# Patient Record
Sex: Female | Born: 1945 | Race: White | Hispanic: No | Marital: Married | Smoking: Never smoker
Health system: Southern US, Community
[De-identification: ages and names within clinical notes are randomized; demographics above are authoritative.]

## PROBLEM LIST (undated history)

## (undated) DIAGNOSIS — K7460 Unspecified cirrhosis of liver: Secondary | ICD-10-CM

## (undated) DIAGNOSIS — I519 Heart disease, unspecified: Secondary | ICD-10-CM

## (undated) DIAGNOSIS — I4891 Unspecified atrial fibrillation: Secondary | ICD-10-CM

## (undated) DIAGNOSIS — I509 Heart failure, unspecified: Secondary | ICD-10-CM

## (undated) DIAGNOSIS — E039 Hypothyroidism, unspecified: Secondary | ICD-10-CM

## (undated) HISTORY — DX: Hypothyroidism, unspecified: E03.9

## (undated) HISTORY — DX: Heart failure, unspecified: I50.9

## (undated) HISTORY — PX: MITRAL VALVE REPLACEMENT: SHX147

## (undated) HISTORY — DX: Unspecified atrial fibrillation: I48.91

## (undated) HISTORY — DX: Heart disease, unspecified: I51.9

## (undated) HISTORY — DX: Unspecified cirrhosis of liver: K74.60

---

## 1996-03-22 HISTORY — PX: ANKLE SURGERY: SHX546

## 2012-03-22 HISTORY — PX: TOTAL HIP ARTHROPLASTY: SHX124

## 2016-03-22 HISTORY — PX: AORTIC VALVE REPLACEMENT: SHX41

## 2020-04-21 ENCOUNTER — Other Ambulatory Visit: Payer: Self-pay | Admitting: Family Medicine

## 2020-04-21 DIAGNOSIS — Z1231 Encounter for screening mammogram for malignant neoplasm of breast: Secondary | ICD-10-CM

## 2020-04-24 ENCOUNTER — Encounter: Payer: Self-pay | Admitting: Gastroenterology

## 2020-04-29 ENCOUNTER — Encounter: Payer: Self-pay | Admitting: Gastroenterology

## 2020-04-29 ENCOUNTER — Ambulatory Visit: Payer: BC Managed Care – PPO | Admitting: Gastroenterology

## 2020-04-29 ENCOUNTER — Other Ambulatory Visit: Payer: Self-pay

## 2020-04-29 VITALS — BP 116/60 | HR 72 | Ht 60.0 in | Wt 167.0 lb

## 2020-04-29 DIAGNOSIS — R188 Other ascites: Secondary | ICD-10-CM

## 2020-04-29 DIAGNOSIS — K7460 Unspecified cirrhosis of liver: Secondary | ICD-10-CM

## 2020-04-29 NOTE — Progress Notes (Addendum)
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  Patient recently relocated here from [place].  She was evaluated for cirrhosis with ascites in 2019.  She underwent further evaluation by Dr. Lyda Kalata at [facility].  Unfortunately I only have records today that included ultrasound dated 11-22-2019 and a after visit summary with medications.  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  RUQ [place] 11-22-2019 showed cirrhosis, small amount of ascites, no hepatic lesions, cholelithiasis and a 9 mm simple cyst in the right kidney. She has valvular heart disease and has undergone 2 mitral valve replacements and most recently a TAVR in 2018.  She has been managed on a low-sodium diet, torsemide and spironolactone.  She states her weights have been stable in the 160-165 range at home.  No gastrointestinal complaints today. Denies weight loss, abdominal pain, constipation, diarrhea, change in stool caliber, melena, hematochezia, nausea, vomiting, dysphagia, reflux symptoms, chest pain.    No Known Allergies Outpatient Medications Prior to Visit  Medication Sig Dispense Refill  . Cholecalciferol (VITAMIN D3) 50 MCG (2000 UT) TABS Take 6,000 Units by mouth daily.    Marland Kitchen levothyroxine (SYNTHROID) 150 MCG tablet Take 150 mcg by mouth daily before breakfast.    . losartan (COZAAR) 50 MG tablet Take 50 mg by mouth daily.    . metoprolol succinate (TOPROL-XL) 50 MG 24 hr tablet Take 50 mg by mouth daily. Take with or immediately following a meal.    . spironolactone (ALDACTONE) 25 MG tablet Take 25 mg by mouth every other day.    . torsemide (DEMADEX) 20 MG tablet Take 40 mg by mouth daily.    Marland Kitchen warfarin (COUMADIN) 2 MG tablet Take 2 tablets by mouth four times a week    . warfarin (COUMADIN) 6 MG tablet 1 tablet by mouth three days a week     No facility-administered  medications prior to visit.   Past Medical History:  Diagnosis Date  . Atrial fibrillation (HCC)   . Heart disease    aortic and mitral valve problems  . Hepatic cirrhosis (HCC)   . Hypothyroidism    Past Surgical History:  Procedure Laterality Date  . ANKLE SURGERY  1998  . AORTIC VALVE REPLACEMENT  2018  . CESAREAN SECTION    . MITRAL VALVE REPLACEMENT     x 2  . TOTAL HIP ARTHROPLASTY Left 2014   Social History   Socioeconomic History  . Marital status: Married    Spouse name: Not on file  . Number of children: 1  . Years of education: Not on file  . Highest education level: Not on file  Occupational History  . Occupation: retired  Tobacco Use  . Smoking status: Never Smoker  . Smokeless tobacco: Never Used  Vaping Use  . Vaping Use: Never used  Substance and Sexual Activity  . Alcohol use: Yes    Comment: rarely  . Drug use: Not on file  . Sexual activity: Not on file  Other Topics Concern  . Not on file  Social History Narrative  . Not on file   Social Determinants of Health   Financial Resource Strain: Not on file  Food Insecurity: Not on file  Transportation Needs: Not on file  Physical Activity: Not on file  Stress: Not on file  Social Connections: Not on file   Family History  Problem Relation Age of Onset  . Rheumatic fever Mother   . Heart disease Mother 53  . Emphysema Brother   . Heart attack Brother   . Breast cancer Maternal Grandmother   . Heart disease Brother   . Diabetes Brother      Review of Systems: Pertinent positive and negative review of systems were noted in the above HPI section. All other review of systems were otherwise negative.   Physical Exam: General: Well developed, well nourished, no acute distress Head: Normocephalic and atraumatic Eyes: Sclerae anicteric, EOMI Ears: Normal auditory acuity Mouth: Not examined, mask on during Covid-19 pandemic Neck: Supple, no masses or thyromegaly Lungs: Clear throughout to  auscultation Heart: Regular rate and rhythm; no murmurs, rubs or bruits Abdomen: Soft, non tender and non distended. No masses, hepatosplenomegaly or hernias noted. Normal Bowel sounds Rectal: Not done Musculoskeletal: Symmetrical with no gross deformities  Skin: No lesions on visible extremities Pulses:  Normal pulses noted Extremities: No clubbing, cyanosis, edema or deformities noted Neurological: Alert oriented x 4, grossly nonfocal Cervical Nodes:  No significant cervical adenopathy Inguinal Nodes: No significant inguinal adenopathy Psychological:  Alert and cooperative. Normal mood and affect   Assessment and Recommendations:  1. 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  Plan for routine hepatocellular carcinoma screening with an RUQ [place], CMP, CBC and AFP every 6 months, which will be due in March.  She is maintained on Coumadin so will defer PT/INR.  Continue torsemide 40 mg daily and spironolactone 25 mg every other day.  Maintain 2 g sodium diet.  Request hepatology and GI records from [facility].  Determine timing of variceal screening or variceal surveillance after review of records.  Hepatitis A and B vaccination if it has not been done per record review.  No more than 2 g of acetaminophen daily.  REV in September.   2.  CRC screening, average risk.  Await review of records.  3.  Status post MVR, status post TAVR, A. fib.  Maintained on Coumadin.  She will establish with [facility] in March.    cc: Shon Hale, MD [address] [place],  [place] [address]

## 2020-04-29 NOTE — Patient Instructions (Signed)
We will contact you in March to schedule your ultrasound and have repeat labs.   Please follow up with Dr. Russella Dar in 11/2020.   Thank you for choosing me and [facility].  Venita Lick. Pleas Koch., MD., Clementeen Graham

## 2020-05-05 ENCOUNTER — Other Ambulatory Visit: Payer: Self-pay | Admitting: Family Medicine

## 2020-05-05 DIAGNOSIS — M859 Disorder of bone density and structure, unspecified: Secondary | ICD-10-CM

## 2020-05-26 ENCOUNTER — Telehealth: Payer: Self-pay | Admitting: Gastroenterology

## 2020-05-26 DIAGNOSIS — R188 Other ascites: Secondary | ICD-10-CM

## 2020-05-26 DIAGNOSIS — K7460 Unspecified cirrhosis of liver: Secondary | ICD-10-CM

## 2020-05-26 NOTE — Telephone Encounter (Signed)
Pt is requesting a call back from a nurse to schedule her US. 

## 2020-05-26 NOTE — Telephone Encounter (Signed)
New orders placed for labs and [place] per office note from 04/29/20.  Patient notified that she will be contacted by radiology directly to schedule [place]. She can come for labs at her convenience.

## 2020-06-03 ENCOUNTER — Other Ambulatory Visit: Payer: Self-pay

## 2020-06-03 ENCOUNTER — Ambulatory Visit
Admission: RE | Admit: 2020-06-03 | Discharge: 2020-06-03 | Disposition: A | Discharge status: Home / Self Care | Payer: BC Managed Care – PPO | Source: Ambulatory Visit | Attending: Family Medicine | Admitting: Family Medicine

## 2020-06-03 DIAGNOSIS — Z1231 Encounter for screening mammogram for malignant neoplasm of breast: Secondary | ICD-10-CM

## 2020-06-04 ENCOUNTER — Ambulatory Visit (HOSPITAL_COMMUNITY)
Admission: RE | Admit: 2020-06-04 | Discharge: 2020-06-04 | Disposition: A | Discharge status: Home / Self Care | Payer: BC Managed Care – PPO | Source: Ambulatory Visit | Attending: Gastroenterology | Admitting: Gastroenterology

## 2020-06-04 DIAGNOSIS — K7460 Unspecified cirrhosis of liver: Secondary | ICD-10-CM | POA: Diagnosis not present

## 2020-06-04 DIAGNOSIS — R188 Other ascites: Secondary | ICD-10-CM | POA: Insufficient documentation

## 2020-06-05 ENCOUNTER — Telehealth: Payer: Self-pay

## 2020-06-05 DIAGNOSIS — K7460 Unspecified cirrhosis of liver: Secondary | ICD-10-CM

## 2020-06-05 DIAGNOSIS — R188 Other ascites: Secondary | ICD-10-CM

## 2020-06-05 NOTE — Telephone Encounter (Signed)
Please tell the patient that the ultrasound shows cirrhosis and ascites plus gallstones as we knew.  There is some consideration to be given to having an MRI but it is not urgent and Dr. Russella Dar can review this and the prior records to make a decision about the MRI.  There are no tumors or anything seen which is what we were looking for and the radiologist suggested to consider an MRI which is more sensitive.

## 2020-06-05 NOTE — Telephone Encounter (Signed)
Lm on vm for patient to return call 

## 2020-06-05 NOTE — Telephone Encounter (Signed)
Diane at [facility] radiology called to give call report on [place] from 06/04/20. Dr. Leone Payor as DOD this morning, 06/05/20.   Dr. Ardell Isaacs patient with hx of cirrhosis  IMPRESSION: 1. Cirrhosis with heterogeneous hepatic parenchyma. Likely mild hepatic steatosis. No findings of portal hypertension. Limited evaluation for focal hepatic lesion. Recommend MRI liver protocol for further evaluation. 2. Cholelithiasis with no acute cholecystitis. 3. At least small to moderate volume ascites.

## 2020-06-05 NOTE — Telephone Encounter (Signed)
Spoke with patient in regards to her [place] results. She is aware that Dr. Russella Dar will review once he returns and will further advise in regards to MRI. Patient verbalized understanding and had no concerns at the end of the call.

## 2020-06-07 NOTE — Telephone Encounter (Signed)
Ultrasound shows cirrhosis and ascites plus gallstones.  Limited evaluation for focal hepatic lesion.  Schedule abd MRI, as the radiologist suggested, for a more sensitive hepatic evaluation. Please ask her to complete her blood work as ordered.

## 2020-06-09 ENCOUNTER — Other Ambulatory Visit: Payer: Self-pay | Admitting: Gastroenterology

## 2020-06-09 NOTE — Addendum Note (Signed)
Addended by: Annett Fabian on: 06/09/2020 12:19 PM   Modules accepted: Orders

## 2020-06-09 NOTE — Telephone Encounter (Signed)
Patient notified of the results and recommendations She declines MRI for now.  "I have a lot of out of town appointments I need to do, I will call back in a few months" She wishes to go to [facility] for blood draws.  Orders faxed to [phone]

## 2020-06-10 LAB — COMPREHENSIVE METABOLIC PANEL
ALT: 8 IU/L (ref 0–32)
AST: 24 IU/L (ref 0–40)
Albumin/Globulin Ratio: 1.1 — ABNORMAL LOW (ref 1.2–2.2)
Albumin: 4.1 g/dL (ref 3.7–4.7)
Alkaline Phosphatase: 90 IU/L (ref 44–121)
BUN/Creatinine Ratio: 27 (ref 12–28)
BUN: 39 mg/dL — ABNORMAL HIGH (ref 8–27)
Bilirubin Total: 0.8 mg/dL (ref 0.0–1.2)
CO2: 26 mmol/L (ref 20–29)
Calcium: 9.3 mg/dL (ref 8.7–10.3)
Chloride: 95 mmol/L — ABNORMAL LOW (ref 96–106)
Creatinine, Ser: 1.47 mg/dL — ABNORMAL HIGH (ref 0.57–1.00)
Globulin, Total: 3.6 g/dL (ref 1.5–4.5)
Glucose: 87 mg/dL (ref 65–99)
Potassium: 4.8 mmol/L (ref 3.5–5.2)
Sodium: 135 mmol/L (ref 134–144)
Total Protein: 7.7 g/dL (ref 6.0–8.5)
eGFR: 37 mL/min/{1.73_m2} — ABNORMAL LOW (ref >59)

## 2020-06-10 LAB — CBC WITH DIFFERENTIAL/PLATELET
Basophils Absolute: 0.1 10*3/uL (ref 0.0–0.2)
Basos: 1 %
EOS (ABSOLUTE): 0.4 10*3/uL (ref 0.0–0.4)
Eos: 6 %
Hematocrit: 36.9 % (ref 34.0–46.6)
Hemoglobin: 11.8 g/dL (ref 11.1–15.9)
Immature Grans (Abs): 0 10*3/uL (ref 0.0–0.1)
Immature Granulocytes: 0 %
Lymphocytes Absolute: 1 10*3/uL (ref 0.7–3.1)
Lymphs: 18 %
MCH: 29.1 pg (ref 26.6–33.0)
MCHC: 32 g/dL (ref 31.5–35.7)
MCV: 91 fL (ref 79–97)
Monocytes Absolute: 0.6 10*3/uL (ref 0.1–0.9)
Monocytes: 11 %
Neutrophils Absolute: 3.6 10*3/uL (ref 1.4–7.0)
Neutrophils: 64 %
Platelets: 182 10*3/uL (ref 150–450)
RBC: 4.05 x10E6/uL (ref 3.77–5.28)
RDW: 13.4 % (ref 11.7–15.4)
WBC: 5.7 10*3/uL (ref 3.4–10.8)

## 2020-06-10 LAB — AFP TUMOR MARKER: AFP, Serum, Tumor Marker: 2.2 ng/mL (ref 0.0–9.2)

## 2020-06-18 ENCOUNTER — Other Ambulatory Visit: Payer: Self-pay

## 2020-06-18 ENCOUNTER — Encounter: Payer: Self-pay | Admitting: Cardiovascular Disease

## 2020-06-18 ENCOUNTER — Ambulatory Visit: Payer: BC Managed Care – PPO | Admitting: Cardiovascular Disease

## 2020-06-18 VITALS — BP 102/50 | HR 59 | Ht 60.0 in | Wt 176.8 lb

## 2020-06-18 DIAGNOSIS — Z952 Presence of prosthetic heart valve: Secondary | ICD-10-CM

## 2020-06-18 DIAGNOSIS — K7460 Unspecified cirrhosis of liver: Secondary | ICD-10-CM

## 2020-06-18 DIAGNOSIS — I252 Old myocardial infarction: Secondary | ICD-10-CM

## 2020-06-18 DIAGNOSIS — I071 Rheumatic tricuspid insufficiency: Secondary | ICD-10-CM | POA: Diagnosis not present

## 2020-06-18 DIAGNOSIS — K7469 Other cirrhosis of liver: Secondary | ICD-10-CM

## 2020-06-18 DIAGNOSIS — Z7901 Long term (current) use of anticoagulants: Secondary | ICD-10-CM

## 2020-06-18 DIAGNOSIS — I50812 Chronic right heart failure: Secondary | ICD-10-CM | POA: Diagnosis not present

## 2020-06-18 DIAGNOSIS — I10 Essential (primary) hypertension: Secondary | ICD-10-CM

## 2020-06-18 DIAGNOSIS — I4821 Permanent atrial fibrillation: Secondary | ICD-10-CM

## 2020-06-18 MED ORDER — SPIRONOLACTONE 25 MG PO TABS: 25.0000 mg | ORAL_TABLET | ORAL | 4 refills | Status: DC

## 2020-06-18 MED ORDER — TORSEMIDE 20 MG PO TABS: 40.0000 mg | ORAL_TABLET | Freq: Every day | ORAL | 4 refills | Status: DC

## 2020-06-18 MED ORDER — AMOXICILLIN 500 MG PO CAPS: ORAL_CAPSULE | ORAL | 1 refills | Status: DC

## 2020-06-18 MED ORDER — METOPROLOL SUCCINATE ER 50 MG PO TB24: 50.0000 mg | ORAL_TABLET | Freq: Every day | ORAL | 4 refills | Status: DC

## 2020-06-18 MED ORDER — LOSARTAN POTASSIUM 50 MG PO TABS: 50.0000 mg | ORAL_TABLET | Freq: Every day | ORAL | 4 refills | Status: DC

## 2020-06-18 NOTE — Progress Notes (Signed)
This encounter was created in error - please disregard.

## 2020-06-18 NOTE — Progress Notes (Signed)
Cardiology Consultation Note:    Date:  06/18/2020   ID:  Chelsea Boyer, DOB March 14, 1946, MRN [id number]  PCP:  [facility]  Cardiologist:  Thurmon Fair, MD  Advanced Practice Provider:  No care team member to display Electrophysiologist:  None       Referring MD: Shon Hale, *   Chief Complaint  Patient presents with  . New Patient (Initial Visit)   Chelsea Boyer is a 75 y.o. female who is being seen today for the evaluation of valvular heart disease at the request of Shon Hale, *.   History of Present Illness:    Chelsea Boyer is a 75 y.o. female with a hx of suspected rheumatic heart disease and multiple subsequent valvular surgeries.  She has relocated from [place] to be closer to her grandchildren.  She was initially diagnosed with mitral valve disease and underwent mitral valve replacement with a biological prosthesis in 1986 (at that point she was still establishing her own family).  She subsequent underwent replacement of the biological prosthesis with a mechanical valve (Saint Jude 25 mm) in 1996.  During fall, she developed aortic valve stenosis and underwent TAVR with a Edwards SAPIEN valve (size unknown) in 2018.  She has a longstanding history of atrial fibrillation and after failing 3 previous attempts at cardioversion is being managed with rate control for multiple years.  Review of echocardiogram shows that she initially had mild-moderate tricuspid insufficiency as recently as 2013 that progressively worsened.  Since 2015 her tricuspid valve regurgitation has been described as being at least moderate to severe.    Before her TAVR procedure she was diagnosed with a non-STEMI, but as far as I understand, coronary angiography did not show any evidence of significant coronary stenoses (report not available).  Following her TAVR procedure in 2018 she developed ascites and severe lower extremity edema and was  diagnosed with cirrhosis, but did not have esophageal varices.  This has been managed successfully with diuretics.  He had a more extensive evaluation for pulmonary hypertension including a normal VQ scan and overnight symmetry and high resolution CT scan of the chest studies to exclude pulmonary embolism and obstructive sleep apnea and interstitial lung disease, respectively.  She feels well.  She has NYHA functional class II exertional dyspnea, but denies any problems with chest pain, dizziness, palpitations, syncope, falls or bleeding problems.  Since moving to [place] she has been having her prothrombin time checked at lab core with results being managed by her cardiologist in [place].  Additional medical problems include treated HTN and hypothyroidism on levothyroxine supplement.  Past Medical History:  Diagnosis Date  . Atrial fibrillation (HCC)   . Heart disease    aortic and mitral valve problems  . Hepatic cirrhosis (HCC)   . Hypothyroidism     Past Surgical History:  Procedure Laterality Date  . ANKLE SURGERY  1998  . AORTIC VALVE REPLACEMENT  2018  . CESAREAN SECTION    . MITRAL VALVE REPLACEMENT     x 2  . TOTAL HIP ARTHROPLASTY Left 2014    Current Medications: Current Meds  Medication Sig  . amoxicillin (AMOXIL) 500 MG capsule Take 4 tablets (2000 mg) prior to the dental visit  . Cholecalciferol (VITAMIN D3) 50 MCG (2000 UT) TABS Take 6,000 Units by mouth daily.  Marland Kitchen levothyroxine (SYNTHROID) 150 MCG tablet Take 150 mcg by mouth daily before breakfast.  . warfarin (COUMADIN) 2 MG  tablet Take 2 tablets by mouth four times a week  . warfarin (COUMADIN) 6 MG tablet 1 tablet by mouth three days a week  . [DISCONTINUED] losartan (COZAAR) 50 MG tablet Take 50 mg by mouth daily.  . [DISCONTINUED] metoprolol succinate (TOPROL-XL) 50 MG 24 hr tablet Take 50 mg by mouth daily. Take with or immediately following a meal.  . [DISCONTINUED] spironolactone (ALDACTONE) 25 MG  tablet Take 25 mg by mouth every other day.  . [DISCONTINUED] torsemide (DEMADEX) 20 MG tablet Take 40 mg by mouth daily.     Allergies:   Patient has no known allergies.   Social History   Socioeconomic History  . Marital status: Married    Spouse name: Not on file  . Number of children: 1  . Years of education: Not on file  . Highest education level: Not on file  Occupational History  . Occupation: retired  Tobacco Use  . Smoking status: Never Smoker  . Smokeless tobacco: Never Used  Vaping Use  . Vaping Use: Never used  Substance and Sexual Activity  . Alcohol use: Yes    Comment: rarely  . Drug use: Not on file  . Sexual activity: Not on file  Other Topics Concern  . Not on file  Social History Narrative  . Not on file   Social Determinants of Health   Financial Resource Strain: Not on file  Food Insecurity: Not on file  Transportation Needs: Not on file  Physical Activity: Not on file  Stress: Not on file  Social Connections: Not on file     Family History: The patient's family history includes Breast cancer in her maternal grandmother; Diabetes in her brother; Emphysema in her brother; Heart attack in her brother; Heart disease in her brother; Heart disease (age of onset: 2936) in her mother; Rheumatic fever in her mother.  ROS:   Please see the history of present illness.     All other systems reviewed and are negative.  EKGs/Labs/Other Studies Reviewed:    The following studies were reviewed today: Office records, labs, nuclear stress test and [facility] [place]    The most recent echocardiogram is from 03/06/2020.  This shows normal left ventricular size and function normally functioning aortic valve prosthesis (peak velocity 3 m/s, maximum gradient 35 mmHg, mean gradient 22 mmHg, trace aortic insufficiency, not specified whether perivalvular or intravalvular), normally functioning mechanical mitral valve prosthesis with a  mean gradient of 7 mmHg (heart rate not specified, pressure half-time not reported), moderate to severe tricuspid insufficiency, calculated PA systolic pressure 44 mmHg assuming RA pressure 3 mmHg.  The most recent nuclear perfusion study is dated June 14, 2012 and shows a an apical attenuation defect, no ischemia seen, EF 55%  EKG:  EKG is ordered today.  The ekg ordered today demonstrates fibrillation with slow ventricular response 59 bpm, left bundle branch block with left axis deviation, QRS 152 ms, QTC 463 ms  Recent Labs: 06/09/2020: ALT 8; BUN 39; Creatinine, Ser 1.47; Hemoglobin 11.8; Platelets 182; Potassium 4.8; Sodium 135   Previous labs from [place] appear to show a baseline BUN of 50 and creatinine of 1.33, normal liver function tests including albumin of 4.3, cholesterol 162, triglycerides 94, HDL 49, calculated LDL 91, hemoglobin 12.5 Recent Lipid Panel No results found for: CHOL, TRIG, HDL, CHOLHDL, VLDL, LDLCALC, LDLDIRECT   Risk Assessment/Calculations:    CHA2DS2-VASc Score =   Not appropriate (mechanical heart valve) This indicates a  % annual  risk of stroke. The patient's score is based upon:      Physical Exam:    VS:  BP (!) 102/50   Pulse (!) 59   Ht 5' (1.524 m)   Wt 176 lb 12.8 oz (80.2 kg)   BMI 34.53 kg/m     Wt Readings from Last 3 Encounters:  06/18/20 176 lb 12.8 oz (80.2 kg)  04/29/20 167 lb (75.8 kg)     GEN: Moderately obese, well nourished, well developed in no acute distress HEENT: Normal NECK: Jugular venous pulsations 4-5 cm above the sternal angle, with very prominent V waves all the way to the angle of the jaw; No carotid bruits LYMPHATICS: No lymphadenopathy CARDIAC: Irregular, crisp mechanical valve clicks, 2/6 aortic ejection murmur, no diastolic murmurs, rubs, gallops RESPIRATORY:  Clear to auscultation without rales, wheezing or rhonchi  ABDOMEN: Soft, non-tender, non-distended MUSCULOSKELETAL:  No edema; No deformity  SKIN:  Warm and dry NEUROLOGIC:  Alert and oriented x 3 PSYCHIATRIC:  Normal affect   ASSESSMENT:    1. H/O mitral valve replacement   2. Chronic right-sided heart failure (HCC)   3. Rheumatic tricuspid valve regurgitation   4. H/O mitral valve replacement with mechanical valve   5. History of transcatheter aortic valve replacement (TAVR)   6. History of myocardial infarction   7. Permanent atrial fibrillation (HCC)   8. Essential hypertension   9. Other cirrhosis of liver (HCC)   10. Long term (current) use of anticoagulants    PLAN:    In order of problems listed above:  1. Right heart failure/PAH: She has evidence of elevated right heart filling pressures, but no edema.  She has probably severe tricuspid regurgitation and this is the most likely cause for cardiac cirrhosis.  Currently appears to be at optimal fluid status however, considering her blood pressure.  Continue current dose of diuretic.  Echocardiograms have estimated variable degrees of pulmonary hypertension, in the 40-60 mmHg range.  In part this variability appears to be related to the assuming the right atrial pressure.  It appears likely that her true systolic PA pressure is in the 55-60 mmHg range since I doubt that she has had normal right atrial pressure in a long time. 2. Severe tricuspid regurgitation: Physical exam clearly supports a diagnosis of severe TR.  I cannot tell from the current available data whether this is due to rheumatic tricuspid valve disease or a consequence of pulmonary hypertension or both.  We will get an updated echocardiogram.  She is not a candidate for redo thoracotomy for mitral valve surgical repair, which would have been her third open heart surgery.  She might be a candidate for a tricuspid valve clip, unless the leaflets are quite restricted. 3. Mechanical MVR: Moderately elevated mean gradient at her last echocardiogram, but stable over the years.  Not sure if it is significant since the heart  rate was not reported.  She is aware of the need for endocarditis prophylaxis. 4. TAVR: Normal gradients.  We will follow with echo.  Need to clarify whether the aortic insufficiency is valvular or perivalvular.  This is not evident from the available reports. 5. Hx of NSTEMI: I suspect that this was not due to conventional coronary disease.  Could have been cardioembolic or demand ischemia in the setting of severe aortic stenosis.  There is no mention of coronary disease in her records. 6. Permanent atrial fibrillation: She has severe left atrial dilation and severe tricuspid insufficiency, the chances of return  to sinus rhythm are nail.  She is on appropriate anticoagulation.  She is well rate controlled. 7. HTN: Blood pressure is in low normal range.  Metoprolol is serving for both rate control and high blood pressure.  Spironolactone is beneficial in the setting of liver disease.  If we need to back off blood pressure medicines, I would reduce or stop the losartan. 8. 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  Thankfully she has not had any episodes of encephalopathy or gastrointestinal bleeding and her most recent lab test did not show any evidence of parenchymal insufficiency or ongoing injury (normal albumin and normal cholesterol levels, normal transaminases). 9. Anticoagulation: We will enroll in our Coumadin clinic.        Medication Adjustments/Labs and Tests Ordered: Current medicines are reviewed at length with the patient today.  Concerns regarding medicines are outlined above.  Orders Placed This Encounter  Procedures  . EKG 12-Lead  . ECHOCARDIOGRAM COMPLETE   Meds ordered this encounter  Medications  . amoxicillin (AMOXIL) 500 MG capsule    Sig: Take 4 tablets (2000 mg) prior to the dental visit    Dispense:  4 capsule    Refill:  1  . losartan (COZAAR) 50 MG tablet    Sig: Take 1 tablet (50 mg total) by mouth daily.    Dispense:  30 tablet    Refill:  4   . metoprolol succinate (TOPROL-XL) 50 MG 24 hr tablet    Sig: Take 1 tablet (50 mg total) by mouth daily. Take with or immediately following a meal.    Dispense:  30 tablet    Refill:  4  . spironolactone (ALDACTONE) 25 MG tablet    Sig: Take 1 tablet (25 mg total) by mouth every other day.    Dispense:  30 tablet    Refill:  4  . torsemide (DEMADEX) 20 MG tablet    Sig: Take 2 tablets (40 mg total) by mouth daily.    Dispense:  60 tablet    Refill:  4    Patient Instructions  Medication Instructions:  TAKE the Amoxicillin 2000 mg (4 tablets) one hour prior to a dental procedure  *If you need a refill on your cardiac medications before your next appointment, please call your pharmacy*   Lab Work: None ordered If you have labs (blood work) drawn today and your tests are completely normal, you will receive your results only by: Marland Kitchen MyChart Message (if you have MyChart) OR . A paper copy in the mail If you have any lab test that is abnormal or we need to change your treatment, we will call you to review the results.   Testing/Procedures: Your physician has requested that you have an echocardiogram in July. Echocardiography is a painless test that uses sound waves to create images of your heart. It provides your doctor with information about the size and shape of your heart and how well your heart's chambers and valves are working. You may receive an ultrasound enhancing agent through an IV if needed to better visualize your heart during the echo.This procedure takes approximately one hour. There are no restrictions for this procedure. This will take place at the [address].     Follow-Up: At [facility], you and your health needs are our priority.  As part of our continuing mission to provide you with exceptional heart care, we have created designated Provider Care Teams.  These Care Teams include your  primary Cardiologist (physician) and Advanced Practice  Providers (APPs -  Physician Assistants and Nurse Practitioners) who all work together to provide you with the care you need, when you need it.  We recommend signing up for the patient portal called "MyChart".  Sign up information is provided on this After Visit Summary.  MyChart is used to connect with patients for Virtual Visits (Telemedicine).  Patients are able to view lab/test results, encounter notes, upcoming appointments, etc.  Non-urgent messages can be sent to your provider as well.   To learn more about what you can do with MyChart, go to [email or web address].    Your next appointment:   Follow up in July with Dr. Royann Shivers after the echo       Signed, Thurmon Fair, MD  06/18/2020 2:12 PM    [facility]

## 2020-06-18 NOTE — Patient Instructions (Addendum)
Medication Instructions:  TAKE the Amoxicillin 2000 mg (4 tablets) one hour prior to a dental procedure  *If you need a refill on your cardiac medications before your next appointment, please call your pharmacy*   Lab Work: None ordered If you have labs (blood work) drawn today and your tests are completely normal, you will receive your results only by: Marland Kitchen MyChart Message (if you have MyChart) OR . A paper copy in the mail If you have any lab test that is abnormal or we need to change your treatment, we will call you to review the results.   Testing/Procedures: Your physician has requested that you have an echocardiogram in July. Echocardiography is a painless test that uses sound waves to create images of your heart. It provides your doctor with information about the size and shape of your heart and how well your heart's chambers and valves are working. You may receive an ultrasound enhancing agent through an IV if needed to better visualize your heart during the echo.This procedure takes approximately one hour. There are no restrictions for this procedure. This will take place at the [address], [address].     Follow-Up: At [facility], you and your health needs are our priority.  As part of our continuing mission to provide you with exceptional heart care, we have created designated Provider Care Teams.  These Care Teams include your primary Cardiologist (physician) and Advanced Practice Providers (APPs -  Physician Assistants and Nurse Practitioners) who all work together to provide you with the care you need, when you need it.  We recommend signing up for the patient portal called "MyChart".  Sign up information is provided on this After Visit Summary.  MyChart is used to connect with patients for Virtual Visits (Telemedicine).  Patients are able to view lab/test results, encounter notes, upcoming appointments, etc.  Non-urgent messages can be sent to your provider as well.   To  learn more about what you can do with MyChart, go to [email or web address].    Your next appointment:   Follow up in July with Dr. Royann Shivers after the echo

## 2020-06-23 ENCOUNTER — Other Ambulatory Visit: Payer: Self-pay

## 2020-06-23 DIAGNOSIS — K7460 Unspecified cirrhosis of liver: Secondary | ICD-10-CM

## 2020-06-23 MED ORDER — TORSEMIDE 20 MG PO TABS: 20.0000 mg | ORAL_TABLET | Freq: Every day | ORAL | 4 refills | Status: DC

## 2020-06-25 ENCOUNTER — Ambulatory Visit (INDEPENDENT_AMBULATORY_CARE_PROVIDER_SITE_OTHER): Payer: BC Managed Care – PPO

## 2020-06-25 ENCOUNTER — Other Ambulatory Visit: Payer: Self-pay

## 2020-06-25 DIAGNOSIS — Z7901 Long term (current) use of anticoagulants: Secondary | ICD-10-CM | POA: Insufficient documentation

## 2020-06-25 DIAGNOSIS — I4891 Unspecified atrial fibrillation: Secondary | ICD-10-CM | POA: Insufficient documentation

## 2020-06-25 DIAGNOSIS — I824Y9 Acute embolism and thrombosis of unspecified deep veins of unspecified proximal lower extremity: Secondary | ICD-10-CM | POA: Insufficient documentation

## 2020-06-25 LAB — POCT INR: INR: 4.1 — AB (ref 2.0–3.0)

## 2020-06-25 NOTE — Patient Instructions (Addendum)
Hold today only and then continue taking 2 (2mg ) tablets on Sunday, Monday, Wednesday and Saturday.  Take 1 (6 mg) tablet other days of the week. INR in 4 wks. [phone]

## 2020-07-01 ENCOUNTER — Telehealth: Payer: Self-pay | Admitting: Gastroenterology

## 2020-07-01 DIAGNOSIS — K7460 Unspecified cirrhosis of liver: Secondary | ICD-10-CM

## 2020-07-01 NOTE — Telephone Encounter (Signed)
Patient would like to go to [facility] for labs that are due this week.  Orders faxed to [facility] [address] at [phone]

## 2020-07-01 NOTE — Telephone Encounter (Signed)
Patient calling for lab results also has questions regarding pending lab to be done at [facility]

## 2020-07-02 ENCOUNTER — Telehealth: Payer: Self-pay | Admitting: Gastroenterology

## 2020-07-02 NOTE — Telephone Encounter (Signed)
Inbound call from patient. States she was unable to get labs done today because their fax is now out of ink and nothing is going through. She wanted to let you know.

## 2020-07-02 NOTE — Telephone Encounter (Signed)
Noted the pt will have completed as soon as possible.

## 2020-07-03 NOTE — Telephone Encounter (Signed)
Patient calling to follow up on fax please let her know.

## 2020-07-03 NOTE — Telephone Encounter (Signed)
Please send to [facility] thanks

## 2020-07-10 NOTE — Telephone Encounter (Signed)
Patient reports that she has not gone for the labs.  She will go tomorrow.

## 2020-07-23 ENCOUNTER — Other Ambulatory Visit: Payer: Self-pay

## 2020-07-23 ENCOUNTER — Ambulatory Visit (INDEPENDENT_AMBULATORY_CARE_PROVIDER_SITE_OTHER): Payer: BC Managed Care – PPO

## 2020-07-23 DIAGNOSIS — Z7901 Long term (current) use of anticoagulants: Secondary | ICD-10-CM | POA: Diagnosis not present

## 2020-07-23 DIAGNOSIS — Z952 Presence of prosthetic heart valve: Secondary | ICD-10-CM | POA: Diagnosis not present

## 2020-07-23 LAB — POCT INR: INR: 3.2 — AB (ref 2.0–3.0)

## 2020-07-23 NOTE — Patient Instructions (Signed)
continue taking 2 (2mg ) tablets on Sunday, Monday, Wednesday and Saturday.  Take 1 (6 mg) tablet other days of the week. INR in 6 wks.

## 2020-08-06 ENCOUNTER — Telehealth: Payer: Self-pay

## 2020-08-06 MED ORDER — WARFARIN SODIUM 6 MG PO TABS: ORAL_TABLET | ORAL | 0 refills | Status: DC

## 2020-08-06 MED ORDER — WARFARIN SODIUM 2 MG PO TABS: ORAL_TABLET | ORAL | 1 refills | Status: DC

## 2020-08-06 NOTE — Telephone Encounter (Signed)
Filled the 2 and 6mg  warfarin as requested

## 2020-08-12 ENCOUNTER — Ambulatory Visit
Admission: RE | Admit: 2020-08-12 | Discharge: 2020-08-12 | Disposition: A | Discharge status: Home / Self Care | Payer: BC Managed Care – PPO | Source: Ambulatory Visit | Attending: Family Medicine | Admitting: Family Medicine

## 2020-08-12 ENCOUNTER — Other Ambulatory Visit: Payer: Self-pay | Admitting: Family Medicine

## 2020-08-12 DIAGNOSIS — R059 Cough, unspecified: Secondary | ICD-10-CM

## 2020-08-21 ENCOUNTER — Other Ambulatory Visit: Payer: Self-pay | Admitting: Cardiovascular Disease

## 2020-08-29 ENCOUNTER — Other Ambulatory Visit: Payer: Self-pay | Admitting: Cardiovascular Disease

## 2020-09-03 ENCOUNTER — Other Ambulatory Visit: Payer: Self-pay

## 2020-09-03 ENCOUNTER — Ambulatory Visit (INDEPENDENT_AMBULATORY_CARE_PROVIDER_SITE_OTHER): Payer: BC Managed Care – PPO

## 2020-09-03 DIAGNOSIS — Z7901 Long term (current) use of anticoagulants: Secondary | ICD-10-CM | POA: Diagnosis not present

## 2020-09-03 DIAGNOSIS — Z952 Presence of prosthetic heart valve: Secondary | ICD-10-CM

## 2020-09-03 LAB — POCT INR: INR: 6.1 — AB (ref 2.0–3.0)

## 2020-09-03 NOTE — Patient Instructions (Signed)
Hold tonight, Thursday and Friday and then continue taking 2 (2mg ) tablets on Sunday, Monday, Wednesday and Saturday.  Take 1 (6 mg) tablet other days of the week. INR in 3 wks.

## 2020-09-06 ENCOUNTER — Other Ambulatory Visit: Payer: Self-pay | Admitting: Cardiovascular Disease

## 2020-09-24 ENCOUNTER — Other Ambulatory Visit: Payer: Self-pay

## 2020-09-24 ENCOUNTER — Ambulatory Visit (INDEPENDENT_AMBULATORY_CARE_PROVIDER_SITE_OTHER): Payer: BC Managed Care – PPO

## 2020-09-24 ENCOUNTER — Telehealth: Payer: Self-pay

## 2020-09-24 DIAGNOSIS — Z952 Presence of prosthetic heart valve: Secondary | ICD-10-CM | POA: Diagnosis not present

## 2020-09-24 DIAGNOSIS — Z7901 Long term (current) use of anticoagulants: Secondary | ICD-10-CM

## 2020-09-24 LAB — POCT INR: INR: 3.7 — AB (ref 2.0–3.0)

## 2020-09-24 NOTE — Patient Instructions (Signed)
Decrease to 2 (2mg ) tablets on Sunday, Monday, and Saturday. Take 1 (2 mg) tablet on Wednesday.  Take 1 (6 mg) tablet Tuesday, Thursday and Friday. INR in 6 wks.

## 2020-09-25 ENCOUNTER — Other Ambulatory Visit: Payer: Self-pay

## 2020-09-25 MED ORDER — WARFARIN SODIUM 2 MG PO TABS: ORAL_TABLET | ORAL | 0 refills | Status: DC

## 2020-09-25 MED ORDER — WARFARIN SODIUM 6 MG PO TABS: ORAL_TABLET | ORAL | 0 refills | Status: DC

## 2020-10-02 ENCOUNTER — Other Ambulatory Visit: Payer: Self-pay

## 2020-10-02 ENCOUNTER — Ambulatory Visit (HOSPITAL_COMMUNITY): Payer: BC Managed Care – PPO | Attending: Cardiology

## 2020-10-02 DIAGNOSIS — Z952 Presence of prosthetic heart valve: Secondary | ICD-10-CM | POA: Diagnosis not present

## 2020-10-02 LAB — ECHOCARDIOGRAM COMPLETE
AR max vel: 0.8 cm2
AV Area VTI: 0.87 cm2
AV Area mean vel: 1.05 cm2
AV Mean grad: 20 mmHg
AV Peak grad: 34.3 mmHg
Ao pk vel: 2.93 m/s
Area-P 1/2: 2.9 cm2
MV VTI: 1.82 cm2
S' Lateral: 2.6 cm

## 2020-10-03 NOTE — Telephone Encounter (Signed)
done

## 2020-10-04 ENCOUNTER — Other Ambulatory Visit: Payer: Self-pay | Admitting: Cardiovascular Disease

## 2020-10-06 ENCOUNTER — Ambulatory Visit
Admission: RE | Admit: 2020-10-06 | Discharge: 2020-10-06 | Disposition: A | Discharge status: Home / Self Care | Payer: Medicare Other | Source: Ambulatory Visit | Attending: Family Medicine | Admitting: Family Medicine

## 2020-10-06 ENCOUNTER — Other Ambulatory Visit: Payer: Self-pay

## 2020-10-06 DIAGNOSIS — M859 Disorder of bone density and structure, unspecified: Secondary | ICD-10-CM

## 2020-10-08 ENCOUNTER — Ambulatory Visit: Payer: BC Managed Care – PPO | Admitting: Cardiovascular Disease

## 2020-10-17 ENCOUNTER — Other Ambulatory Visit: Payer: Self-pay | Admitting: Cardiovascular Disease

## 2020-10-22 ENCOUNTER — Encounter: Payer: Self-pay | Admitting: Cardiovascular Disease

## 2020-10-22 ENCOUNTER — Ambulatory Visit (INDEPENDENT_AMBULATORY_CARE_PROVIDER_SITE_OTHER): Payer: BC Managed Care – PPO | Admitting: Cardiovascular Disease

## 2020-10-22 ENCOUNTER — Other Ambulatory Visit: Payer: Self-pay

## 2020-10-22 VITALS — BP 120/54 | HR 64 | Ht 60.0 in | Wt 168.0 lb

## 2020-10-22 DIAGNOSIS — I50812 Chronic right heart failure: Secondary | ICD-10-CM | POA: Diagnosis not present

## 2020-10-22 DIAGNOSIS — I2721 Secondary pulmonary arterial hypertension: Secondary | ICD-10-CM | POA: Diagnosis not present

## 2020-10-22 DIAGNOSIS — I10 Essential (primary) hypertension: Secondary | ICD-10-CM

## 2020-10-22 DIAGNOSIS — I252 Old myocardial infarction: Secondary | ICD-10-CM | POA: Diagnosis not present

## 2020-10-22 DIAGNOSIS — Z952 Presence of prosthetic heart valve: Secondary | ICD-10-CM

## 2020-10-22 DIAGNOSIS — I071 Rheumatic tricuspid insufficiency: Secondary | ICD-10-CM

## 2020-10-22 DIAGNOSIS — I4821 Permanent atrial fibrillation: Secondary | ICD-10-CM

## 2020-10-22 DIAGNOSIS — K7469 Other cirrhosis of liver: Secondary | ICD-10-CM

## 2020-10-22 DIAGNOSIS — Z7901 Long term (current) use of anticoagulants: Secondary | ICD-10-CM

## 2020-10-22 MED ORDER — WARFARIN SODIUM 2 MG PO TABS: 2.0000 mg | ORAL_TABLET | Freq: Two times a day (BID) | ORAL | 11 refills | Status: DC

## 2020-10-22 MED ORDER — WARFARIN SODIUM 6 MG PO TABS: 6.0000 mg | ORAL_TABLET | Freq: Every day | ORAL | 11 refills | Status: DC

## 2020-10-22 NOTE — Patient Instructions (Signed)
Medication Instructions:  No changes *If you need a refill on your cardiac medications before your next appointment, please call your pharmacy*   Lab Work: None ordered If you have labs (blood work) drawn today and your tests are completely normal, you will receive your results only by: MyChart Message (if you have MyChart) OR A paper copy in the mail If you have any lab test that is abnormal or we need to change your treatment, we will call you to review the results.   Testing/Procedures: Your physician has requested that you have an echocardiogram in 12 months. Echocardiography is a painless test that uses sound waves to create images of your heart. It provides your doctor with information about the size and shape of your heart and how well your heart's chambers and valves are working. You may receive an ultrasound enhancing agent through an IV if needed to better visualize your heart during the echo.This procedure takes approximately one hour. There are no restrictions for this procedure. This will take place at the [address], [address].     Follow-Up: At [facility], you and your health needs are our priority.  As part of our continuing mission to provide you with exceptional heart care, we have created designated Provider Care Teams.  These Care Teams include your primary Cardiologist (physician) and Advanced Practice Providers (APPs -  Physician Assistants and Nurse Practitioners) who all work together to provide you with the care you need, when you need it.  We recommend signing up for the patient portal called "MyChart".  Sign up information is provided on this After Visit Summary.  MyChart is used to connect with patients for Virtual Visits (Telemedicine).  Patients are able to view lab/test results, encounter notes, upcoming appointments, etc.  Non-urgent messages can be sent to your provider as well.   To learn more about what you can do with MyChart, go to  [email or web address].    Your next appointment:   6 month(s)  The format for your next appointment:   In Person  Provider:   You may see Thurmon Fair, MD or one of the following Advanced Practice Providers on your designated Care Team:   Azalee Course, PA-C Micah Flesher, PA-C or  Judy Pimple, [place]

## 2020-10-22 NOTE — Progress Notes (Signed)
Cardiology Consultation Note:    Date:  10/23/2020   ID:  Chelsea Boyer, DOB December 10, 1945, MRN [id number]  PCP:  Shon Hale, MD   [facility]  Cardiologist:  Thurmon Fair, MD  Advanced Practice Provider:  No care team member to display Electrophysiologist:  None       Referring MD: Shon Hale, *   Chief Complaint  Patient presents with   Follow-up    Post echo.   Shortness of Breath   Cardiac Valve Problem    Mechanical MVR; TAVR; severe TR     History of Present Illness:    Chelsea Boyer is a 75 y.o. female with a hx of suspected rheumatic heart disease and multiple subsequent valvular surgeries.  She has relocated from [place] to be closer to her grandchildren.  She was initially diagnosed with mitral valve disease and underwent mitral valve replacement with a biological prosthesis in 1986 (at that point she was still establishing her own family).  She subsequent underwent replacement of the biological prosthesis with a mechanical valve (Saint Jude 25 mm) in 1996.  During ffollow-up she developed aortic valve stenosis and underwent TAVR with a Edwards SAPIEN valve (23 mm, [id number]) on August 21. 2018.  She has a longstanding history of atrial fibrillation and after failing 3 previous attempts at cardioversion is being managed with rate control for multiple years.  Review of echocardiogram shows that she initially had mild-moderate tricuspid insufficiency as recently as 2013 that progressively worsened.  Since 2015 her tricuspid valve regurgitation has been described as being at least moderate to severe.    Before her TAVR procedure she was diagnosed with a non-STEMI, but as far as I understand, coronary angiography did not show any evidence of significant coronary stenoses (report not available).  Following her TAVR procedure in 2018 she developed ascites and severe lower extremity edema and was diagnosed with cirrhosis, but did not have  esophageal varices.  This has been managed successfully with diuretics.  He had a more extensive evaluation for pulmonary hypertension including a normal VQ scan and overnight symmetry and high resolution CT scan of the chest studies to exclude pulmonary embolism and obstructive sleep apnea and interstitial lung disease, respectively.  She is feeling generally well.  Continues to have NYHA functional class II exertional dyspnea but does not feel particular limited in her activities.  She does not currently have problems with edema or ascites.  She denies orthopnea or PND and is not aware of palpitations.  She has well-controlled atrial fibrillation with a rate in the 60s.  She is now enrolled in our Coumadin clinic.  She has not had any falls injuries or serious bleeding problems.  Her INR on June 15 was quite high at 6.1, but on July 6 was 3.7.  We reviewed her echocardiogram today.  This continues to show normal left ventricular systolic function and normal functioning of the aortic and mitral valve prostheses, but at least moderate to severe tricuspid insufficiency.  Both atria were severely dilated.  Right ventricular systolic function is normal.  The estimated systolic PA pressure is around 45 mmHg.  The inferior vena cava was not dilated.  Additional medical problems include treated HTN and hypothyroidism on levothyroxine supplement.  Past Medical History:  Diagnosis Date   Atrial fibrillation (HCC)    Heart disease    aortic and mitral valve problems   Hepatic cirrhosis (HCC)    Hypothyroidism     Past  Surgical History:  Procedure Laterality Date   ANKLE SURGERY  1998   AORTIC VALVE REPLACEMENT  2018   CESAREAN SECTION     MITRAL VALVE REPLACEMENT     x 2   TOTAL HIP ARTHROPLASTY Left 2014    Current Medications: Current Meds  Medication Sig   amoxicillin (AMOXIL) 500 MG capsule Take 4 tablets (2000 mg) prior to the dental visit   Cholecalciferol (VITAMIN D3) 50 MCG (2000 UT)  TABS Take 6,000 Units by mouth daily.   levothyroxine (SYNTHROID) 150 MCG tablet Take 150 mcg by mouth daily before breakfast.   losartan (COZAAR) 50 MG tablet TAKE 1 TABLET BY MOUTH EVERY DAY   metoprolol succinate (TOPROL-XL) 50 MG 24 hr tablet TAKE 1 TABLET BY MOUTH DAILY. TAKE WITH OR IMMEDIATELY FOLLOWING A MEAL.   spironolactone (ALDACTONE) 25 MG tablet Take 1 tablet (25 mg total) by mouth every other day. (Patient taking differently: Take 25 mg by mouth daily.)   torsemide (DEMADEX) 20 MG tablet Take 1 tablet (20 mg total) by mouth daily. (Patient taking differently: Take 40 mg by mouth daily.)   [DISCONTINUED] warfarin (COUMADIN) 2 MG tablet Take 1 to 2 tablets daily or as directed by the coumadin clinic   [DISCONTINUED] warfarin (COUMADIN) 6 MG tablet TAKE 1 TO 2 TABLETS BY MOUTH EVERY DAY OR AS DIRECTED BY COUMADIN MD     Allergies:   Patient has no known allergies.   Social History   Socioeconomic History   Marital status: Married    Spouse name: Not on file   Number of children: 1   Years of education: Not on file   Highest education level: Not on file  Occupational History   Occupation: retired  Tobacco Use   Smoking status: Never   Smokeless tobacco: Never  Vaping Use   Vaping Use: Never used  Substance and Sexual Activity   Alcohol use: Yes    Comment: rarely   Drug use: Not on file   Sexual activity: Not on file  Other Topics Concern   Not on file  Social History Narrative   Not on file   Social Determinants of Health   Financial Resource Strain: Not on file  Food Insecurity: Not on file  Transportation Needs: Not on file  Physical Activity: Not on file  Stress: Not on file  Social Connections: Not on file     Family History: The patient's family history includes Breast cancer in her maternal grandmother; Diabetes in her brother; Emphysema in her brother; Heart attack in her brother; Heart disease in her brother; Heart disease (age of onset: 6336) in her  mother; Rheumatic fever in her mother.  ROS:   Please see the history of present illness.     All other systems reviewed and are negative.  EKGs/Labs/Other Studies Reviewed:    The following studies were reviewed today: Office records, labs, nuclear stress test and [facility] [place]    The most recent echocardiogram is from 03/06/2020.  This shows normal left ventricular size and function normally functioning aortic valve prosthesis (peak velocity 3 m/s, maximum gradient 35 mmHg, mean gradient 22 mmHg, trace aortic insufficiency, not specified whether perivalvular or intravalvular), normally functioning mechanical mitral valve prosthesis with a mean gradient of 7 mmHg (heart rate not specified, pressure half-time not reported), moderate to severe tricuspid insufficiency, calculated PA systolic pressure 44 mmHg assuming RA pressure 3 mmHg.  The most recent nuclear perfusion study is dated June 14, 2012  and shows a an apical attenuation defect, no ischemia seen, EF 55%  Echocardiogram 10/02/2020:  1. Left ventricular ejection fraction, by estimation, is 60 to 65%. The  left ventricle has normal function. The left ventricle has no regional  wall motion abnormalities. There is mild concentric left ventricular  hypertrophy. Left ventricular diastolic  function could not be evaluated.   2. Right ventricular systolic function is normal. The right ventricular  size is normal. There is moderately elevated pulmonary artery systolic  pressure. The estimated right ventricular systolic pressure is 45.8 mmHg.   3. Right atrial size was severely dilated.   4. Left atrial size was severely dilated.   5. The mitral valve has been repaired/replaced. Cannot assess for MR due  to shadowing from mechanical MVR. There is a 25 mm St. Jude biological  prosthesis with mechanical present in the mitral position. Procedure Date:  1996. MV peak gradient, 18.1 mmHg.   The average  mean mitral valve gradient is 6.5 mmHg.   6. The aortic valve has been repaired/replaced. Periprosthetic aortic  valve regurgitation is trivial. No aortic stenosis is present. There is a  unknown Sapien prosthetic (TAVR) valve present in the aortic position.  Procedure Date: 2018. Aortic valve  mean gradient measures 20.0 mmHg. Aortic valve Vmax measures 2.93 m/s.   7. Tricuspid valve regurgitation is moderate to severe.   8. The inferior vena cava is normal in size with greater than 50%  respiratory variability, suggesting right atrial pressure of 3 mmHg.   9. Compared to echo 2021, mean AVG has decreased from 22 to and  average mean MVG is stable from prior echo of 2021 ( ). Moderate to  severe TR is unchanged. Diastolic function cannot be assess due to  underlying atrial fibrillation as well as  MVR. PASP has not changed ( in 2021).   EKG:  EKG is ordered today.  It shows atrial fibrillation with controlled ventricular rate and left bundle branch block.  Unchanged from previous tracing. Recent Labs: 06/09/2020: ALT 8; BUN 39; Creatinine, Ser 1.47; Hemoglobin 11.8; Platelets 182; Potassium 4.8; Sodium 135   Previous labs from [place] appear to show a baseline BUN of 50 and creatinine of 1.33, normal liver function tests including albumin of 4.3, cholesterol 162, triglycerides 94, HDL 49, calculated LDL 91, hemoglobin 12.5 Recent Lipid Panel No results found for: CHOL, TRIG, HDL, CHOLHDL, VLDL, LDLCALC, LDLDIRECT   Risk Assessment/Calculations:    CHA2DS2-VASc Score =   Not appropriate (mechanical heart valve) This indicates a  % annual risk of stroke. The patient's score is based upon:      Physical Exam:    VS:  BP (!) 120/54 (BP Location: Left Arm, Patient Position: Sitting, Cuff Size: Normal)   Pulse 64   Ht 5' (1.524 m)   Wt 168 lb (76.2 kg)   BMI 32.81 kg/m     Wt Readings from Last 3 Encounters:  10/22/20 168 lb (76.2 kg)  06/18/20 176 lb 12.8 oz  (80.2 kg)  04/29/20 167 lb (75.8 kg)      General: Alert, oriented x3, no distress, mildly obese Head: no evidence of trauma, PERRL, EOMI, no exophtalmos or lid lag, no myxedema, no xanthelasma; normal ears, nose and oropharynx Neck: Average jugular venous pulsations around 6 cm with very prominent V waves all the way to the angle of the jaw and prompt hepatojugular reflux; brisk carotid pulses without delay .  Carotid bruits appear to be radiating from the chest. Chest:  clear to auscultation, no signs of consolidation by percussion or palpation, normal fremitus, symmetrical and full respiratory excursions Cardiovascular: normal position and quality of the apical impulse, irregular rhythm, crisp mechanical valve clicks, rumbling 2/6 early peaking aortic ejection murmur, no diastolic murmurs, rubs or gallops Abdomen: no tenderness or distention, no masses by palpation, no abnormal pulsatility or arterial bruits, normal bowel sounds, no hepatosplenomegaly Extremities: no clubbing, cyanosis or edema; 2+ radial, ulnar and brachial pulses bilaterally; 2+ right femoral, posterior tibial and dorsalis pedis pulses; 2+ left femoral, posterior tibial and dorsalis pedis pulses; no subclavian or femoral bruits Neurological: grossly nonfocal Psych: Normal mood and affect   ASSESSMENT:    1. Chronic right-sided heart failure (HCC)   2. PAH (pulmonary artery hypertension) (HCC)   3. Rheumatic tricuspid valve regurgitation   4. H/O mitral valve replacement with mechanical valve   5. History of transcatheter aortic valve replacement (TAVR)   6. History of myocardial infarction   7. Permanent atrial fibrillation (HCC)   8. Essential hypertension   9. Other cirrhosis of liver (HCC)   10. Long term (current) use of anticoagulants     PLAN:    In order of problems listed above:  Right heart failure/PAH: Appears to be reasonably well compensated but still has evidence of elevated right heart filling  pressures, without ascites or edema.  We will continue the current dose of diuretic.  Echocardiograms have estimated variable degrees of pulmonary hypertension, in the 40-60 mmHg range.  Surprisingly, inferior vena cava is not dilated.  Her PA pressure does appear to be in the mid 40s at this time.  In the long range, the biggest problem is continued hepatic injury from severe tricuspid insufficiency. Severe tricuspid regurgitation: Even after reviewing the echo, it is difficult to say whether the tricuspid insufficiency is primarily due to rheumatic tricuspid valvulitis or the consequences of longstanding mitral valve disease or both. The leaflets appear relatively thin and flexible.  I do not think she is a candidate for a third sternotomy to have tricuspid valve repair.  I recommended that she consider enrollment in the Trilluminate trial for the Tri clip, with [facility] in [place] being one of the clinical trial sites.  I gave her a little bit of information about the trial and that about the device, which is already approved in [place].  She does not seem eager to have any procedures performed, but she promised to think about it. Mechanical MVR: Moderately elevated mean gradient, stable from previous echocardiograms performed in [place].  Aware of the need for endocarditis prophylaxis. TAVR: Normal gradients.  There is trivial perivalvular leak and transfer started gradients are acceptable and unchanged from previous echocardiograms in [place]. Hx of NSTEMI: I suspect that this was not due to conventional coronary disease.  Could have been cardioembolic or demand ischemia in the setting of severe aortic stenosis.  There is no mention of coronary disease in her records. Permanent atrial fibrillation: She has severe biatrial dilatation and will not to return to normal sinus rhythm.  She is on appropriate anticoagulation.  She is well rate controlled. HTN: Systolic blood pressure is a little higher  today, but her diastolic blood pressure remains quite low.  Thankfully she is not symptomatic.  Metoprolol is serving for both rate control and high blood pressure.  Spironolactone is beneficial in the setting of liver disease.  If we need to back off blood pressure medicines, I would reduce or stop the losartan.  She does not  have left ventricular systolic dysfunction and ARB is not critical. 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  Thankfully she has not had any episodes of encephalopathy or gastrointestinal bleeding and her most recent lab test did not show any evidence of parenchymal insufficiency or ongoing injury (normal albumin and normal cholesterol levels, normal transaminases). Anticoagulation: No bleeding complications.  Enrolled in Coumadin clinic.   We will have her come back to the office in 6 months and repeat her echocardiogram every year.  She will call sooner if she makes a decision regarding referral for tricuspid valve clip trial.   Medication Adjustments/Labs and Tests Ordered: Current medicines are reviewed at length with the patient today.  Concerns regarding medicines are outlined above.  Orders Placed This Encounter  Procedures   EKG 12-Lead   ECHOCARDIOGRAM COMPLETE    Meds ordered this encounter  Medications   warfarin (COUMADIN) 2 MG tablet    Sig: Take 1 tablet (2 mg total) by mouth 2 (two) times daily. or as directed by the coumadin clinic    Dispense:  60 tablet    Refill:  11   warfarin (COUMADIN) 6 MG tablet    Sig: Take 1 tablet (6 mg total) by mouth daily. OR AS DIRECTED BY COUMADIN MD    Dispense:  30 tablet    Refill:  11     Patient Instructions  Medication Instructions:  No changes *If you need a refill on your cardiac medications before your next appointment, please call your pharmacy*   Lab Work: None ordered If you have labs (blood work) drawn today and your tests are completely normal, you will receive your results only  by: MyChart Message (if you have MyChart) OR A paper copy in the mail If you have any lab test that is abnormal or we need to change your treatment, we will call you to review the results.   Testing/Procedures: Your physician has requested that you have an echocardiogram in 12 months. Echocardiography is a painless test that uses sound waves to create images of your heart. It provides your doctor with information about the size and shape of your heart and how well your heart's chambers and valves are working. You may receive an ultrasound enhancing agent through an IV if needed to better visualize your heart during the echo.This procedure takes approximately one hour. There are no restrictions for this procedure. This will take place at the [address], [address].     Follow-Up: At [facility], you and your health needs are our priority.  As part of our continuing mission to provide you with exceptional heart care, we have created designated Provider Care Teams.  These Care Teams include your primary Cardiologist (physician) and Advanced Practice Providers (APPs -  Physician Assistants and Nurse Practitioners) who all work together to provide you with the care you need, when you need it.  We recommend signing up for the patient portal called "MyChart".  Sign up information is provided on this After Visit Summary.  MyChart is used to connect with patients for Virtual Visits (Telemedicine).  Patients are able to view lab/test results, encounter notes, upcoming appointments, etc.  Non-urgent messages can be sent to your provider as well.   To learn more about what you can do with MyChart, go to [email or web address].    Your next appointment:   6 month(s)  The format for your next appointment:   In Person  Provider:   You may see  Thurmon Fair, MD or one of the following Advanced Practice Providers on your designated Care Team:   Azalee Course, PA-C Micah Flesher, [place] or  Judy Pimple, PA-C    Signed, Thurmon Fair, MD  10/23/2020 12:24 PM    [facility]

## 2020-10-28 ENCOUNTER — Other Ambulatory Visit: Payer: Self-pay | Admitting: Cardiovascular Disease

## 2020-10-31 ENCOUNTER — Telehealth: Payer: Self-pay | Admitting: Gastroenterology

## 2020-10-31 ENCOUNTER — Telehealth: Payer: Self-pay

## 2020-10-31 ENCOUNTER — Telehealth: Payer: Self-pay | Admitting: Cardiovascular Disease

## 2020-10-31 MED ORDER — SPIRONOLACTONE 25 MG PO TABS: 25.0000 mg | ORAL_TABLET | ORAL | 2 refills | Status: DC

## 2020-10-31 NOTE — Telephone Encounter (Signed)
    Patient Name: Chelsea Boyer  DOB: 12-15-1945 MRN: [id number]  Primary Cardiologist: Thurmon Fair, MD  Chart reviewed as part of pre-operative protocol coverage.   Per pharmacy recommendations, patient can hold coumadin 5 days prior to her upcoming EGD, however WILL require lovenox bridging which will be coordinated by our office once a procedure date is set. Patient should be instructed to contact our office as soon as that information is available. Coumadin should be restarted as soon as she is cleared to do so by her gastroenterologist.   I will route this recommendation to the requesting party via Epic fax function and remove from pre-op pool.  Please call with questions.  Beatriz Stallion, PA-C 10/31/2020, 5:04 PM

## 2020-10-31 NOTE — Telephone Encounter (Signed)
[facility] Pre-operative Risk Assessment     Request for surgical clearance:     Endoscopy Procedure  What type of surgery is being performed?     EGD  When is this surgery scheduled?     TBD ASAP  What type of clearance is required ?   Pharmacy  Are there any medications that need to be held prior to surgery and how long? Warfarin  Practice name and name of physician performing surgery?      [facility]  What is your office phone and fax number?      Phone- [phone]  [phone]  Anesthesia type (None, local, MAC, general) ?       MAC

## 2020-10-31 NOTE — Telephone Encounter (Signed)
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 -From your note, it does not seem that she is actively bleeding.  Anemia could be multifactorial.  I do not see that the labs that you have mentioned.  Plan: -Can we work her into APP clinic for next week. -Would need to check Hemoccult stools as well x 3. -Then if EGD is absolutely needed, would need cardiology clearance and ?Lovenox or heparin bridging -Avoid all nonsteroidals.  RG

## 2020-10-31 NOTE — Telephone Encounter (Signed)
Patient with diagnosis of mechanical mitral valve on warfarin for anticoagulation.    Procedure: EGD Date of procedure: ASAP  CrCl 30.16 Platelet count 182  Per office protocol, patient can hold warfarin for 5 days prior to procedure.    Patient WILL  need bridging with Lovenox (enoxaparin) around procedure.  This will be coordinated by coumadin clinic. Patient should let coumadin clinic know ASAP when procedure date is set

## 2020-10-31 NOTE — Telephone Encounter (Signed)
*  STAT* If patient is at the pharmacy, call can be transferred to refill team.   1. Which medications need to be refilled? (please list name of each medication and dose if known) spironolactone (ALDACTONE) 25 MG tablet  2. Which pharmacy/location (including street and city if local pharmacy) is medication to be sent to? [id number] , [place] - [address] AT [address] OF [address]  3. Do they need a 30 day or 90 day supply? 90  Patient is out of medication

## 2020-10-31 NOTE — Telephone Encounter (Signed)
Left message for patient to call back  

## 2020-10-31 NOTE — Telephone Encounter (Signed)
Patient with a hx of cirrhosis and ascites of Dr. Russella Dar.  Labs sent today from PCP K. Timberlake.  Hgb decreased to 9.5.  Dr. Chanetta Marshall questions if the patient needs EGD for variceal screening. Patient is maintained on Warfarin due to valve replacement.  There are no APP appts next week and Dr. Russella Dar is out of the office until 8/22.    Dr. Chales Abrahams you are MD of the day.  Please advise.    Labs from 8/9 Hgb 9.5 Hct 28.6 RBC 3.31 BUN ^ 49 Creat ^1.52  Na 134 GFR 35  Other labs were normal from 8/9 (CMET, CBC)

## 2020-10-31 NOTE — Telephone Encounter (Signed)
Patient notified of need for OV next week on 11/05/20 9:30 with Doug Sou, PA Clearance faxed to cardiology in anticipation of possible EGD  Patient not able to come for hemoccult prior to OV Labs from Dr. Chanetta Marshall sent to [facility] for OV next week Patient denies dark, bloody, or black stools.

## 2020-10-31 NOTE — Telephone Encounter (Signed)
Dawn called from PCP asking if you can please call regarding labs they recently faxed over that they are concerned about [phone]

## 2020-11-03 ENCOUNTER — Telehealth: Payer: Self-pay

## 2020-11-03 NOTE — Telephone Encounter (Signed)
Called patient back and rescheduled her office visit to 12/03/20.

## 2020-11-03 NOTE — Telephone Encounter (Signed)
I called the patient to discuss upcoming Endoscopy procedure, but that has yet to be schedule.  She is seeing GI provider late in September.  I told her we will discuss Lovenox Bridging as procedure date approaches.  She verbalized understanding.

## 2020-11-03 NOTE — Telephone Encounter (Signed)
Patient called in to reschedule appt 8/17. States she is unable to make it due to having her grandchild. Asks if there any other day this week?

## 2020-11-04 ENCOUNTER — Telehealth: Payer: Self-pay | Admitting: Cardiovascular Disease

## 2020-11-04 NOTE — Telephone Encounter (Signed)
Order Providers  Prescribing Provider Encounter Provider  Croitoru, Rachelle Hora, MD Croitoru, Rachelle Hora, MD   Outpatient Medication Detail   Disp Refills Start End   spironolactone (ALDACTONE) 25 MG tablet 90 tablet 2 10/31/2020    Sig - Route: Take 1 tablet (25 mg total) by mouth every other day. - Oral   Sent to pharmacy as: spironolactone (ALDACTONE) 25 MG tablet   E-Prescribing Status: Receipt confirmed by pharmacy (10/31/2020  1:39 PM EDT)    Pharmacy  [id number] - [place], [place] - [address] [facility] [facility] [facility] [address]

## 2020-11-04 NOTE — Telephone Encounter (Signed)
Left message for patient that Rx was sent to pharmacy on 10/31/20

## 2020-11-04 NOTE — Telephone Encounter (Signed)
?*  STAT* If patient is at the pharmacy, call can be transferred to refill team. ? ? ?1. Which medications need to be refilled? (please list name of each medication and dose if known) spironolactone (ALDACTONE) 25 MG tablet ? ?2. Which pharmacy/location (including street and city if local pharmacy) is medication to be sent to? [id number] [place], [place] - [address] AT [address] ? ?3. Do they need a 30 day or 90 day supply? 90 ? ?

## 2020-11-05 ENCOUNTER — Ambulatory Visit: Payer: BC Managed Care – PPO | Admitting: Gastroenterology

## 2020-11-07 ENCOUNTER — Ambulatory Visit (INDEPENDENT_AMBULATORY_CARE_PROVIDER_SITE_OTHER): Payer: BC Managed Care – PPO

## 2020-11-07 ENCOUNTER — Telehealth: Payer: Self-pay | Admitting: Cardiovascular Disease

## 2020-11-07 ENCOUNTER — Other Ambulatory Visit: Payer: Self-pay

## 2020-11-07 DIAGNOSIS — Z7901 Long term (current) use of anticoagulants: Secondary | ICD-10-CM

## 2020-11-07 DIAGNOSIS — Z952 Presence of prosthetic heart valve: Secondary | ICD-10-CM | POA: Diagnosis not present

## 2020-11-07 LAB — POCT INR: INR: 4.6 — AB (ref 2.0–3.0)

## 2020-11-07 NOTE — Telephone Encounter (Signed)
Patient is wanted to let Dr. Jomarie Longs know that she is interested in talking with Dr. Art Buff (as discussed with  provider on last appointment).    Please advise

## 2020-11-07 NOTE — Patient Instructions (Signed)
Hold today only and then continue taking 2 (2mg ) tablets on Sunday, Monday, and Saturday. Take 1 (2 mg) tablet on Wednesday.  Take 1 (6 mg) tablet Tuesday, Thursday and Friday. INR in 1 wk.

## 2020-11-12 ENCOUNTER — Other Ambulatory Visit: Payer: Self-pay

## 2020-11-12 ENCOUNTER — Ambulatory Visit (INDEPENDENT_AMBULATORY_CARE_PROVIDER_SITE_OTHER): Payer: BC Managed Care – PPO | Admitting: Pharmacist

## 2020-11-12 DIAGNOSIS — I824Y9 Acute embolism and thrombosis of unspecified deep veins of unspecified proximal lower extremity: Secondary | ICD-10-CM

## 2020-11-12 DIAGNOSIS — Z952 Presence of prosthetic heart valve: Secondary | ICD-10-CM | POA: Diagnosis not present

## 2020-11-12 DIAGNOSIS — Z7901 Long term (current) use of anticoagulants: Secondary | ICD-10-CM | POA: Diagnosis not present

## 2020-11-12 DIAGNOSIS — I4891 Unspecified atrial fibrillation: Secondary | ICD-10-CM | POA: Diagnosis not present

## 2020-11-12 LAB — POCT INR: INR: 3.9 — AB (ref 2.0–3.0)

## 2020-11-12 MED ORDER — WARFARIN SODIUM 5 MG PO TABS
ORAL_TABLET | ORAL | 0 refills | Status: DC
Start: 2020-11-12 — End: 2020-12-04

## 2020-11-12 NOTE — Progress Notes (Signed)
Patient on regimen of two 2mg  tablets and one 6 mg tablet on differing days of the week.  Reports she was previously well controlled on 5mg  tablets and was a less confusing dosing scheduled.  New Rx sent to pharmacy.

## 2020-11-12 NOTE — Patient Instructions (Signed)
Description   Switched to 5mg  tablets daily for dosing ease.  Hold dose today and then begin taking 1 tablet (5mg ) daily.  Please call with any questions.  [phone]

## 2020-11-21 ENCOUNTER — Other Ambulatory Visit: Payer: Self-pay

## 2020-11-21 ENCOUNTER — Ambulatory Visit (INDEPENDENT_AMBULATORY_CARE_PROVIDER_SITE_OTHER): Payer: BC Managed Care – PPO

## 2020-11-21 DIAGNOSIS — Z952 Presence of prosthetic heart valve: Secondary | ICD-10-CM | POA: Diagnosis not present

## 2020-11-21 DIAGNOSIS — Z7901 Long term (current) use of anticoagulants: Secondary | ICD-10-CM | POA: Diagnosis not present

## 2020-11-21 LAB — POCT INR: INR: 4.1 — AB (ref 2.0–3.0)

## 2020-11-21 NOTE — Patient Instructions (Signed)
Hold dose today and then begin taking 1 tablet (5mg ) daily, except 0.5 tablet Monday and Friday..  INR 2 weeks.   Please call with any questions.  [phone]

## 2020-12-03 ENCOUNTER — Encounter: Payer: Self-pay | Admitting: Gastroenterology

## 2020-12-03 ENCOUNTER — Other Ambulatory Visit (INDEPENDENT_AMBULATORY_CARE_PROVIDER_SITE_OTHER): Payer: BC Managed Care – PPO

## 2020-12-03 ENCOUNTER — Telehealth: Payer: Self-pay

## 2020-12-03 ENCOUNTER — Ambulatory Visit (INDEPENDENT_AMBULATORY_CARE_PROVIDER_SITE_OTHER): Payer: BC Managed Care – PPO | Admitting: Gastroenterology

## 2020-12-03 VITALS — BP 112/70 | HR 88 | Ht 60.0 in | Wt 167.0 lb

## 2020-12-03 DIAGNOSIS — K7460 Unspecified cirrhosis of liver: Secondary | ICD-10-CM

## 2020-12-03 DIAGNOSIS — D649 Anemia, unspecified: Secondary | ICD-10-CM

## 2020-12-03 DIAGNOSIS — Z7901 Long term (current) use of anticoagulants: Secondary | ICD-10-CM | POA: Diagnosis not present

## 2020-12-03 DIAGNOSIS — R188 Other ascites: Secondary | ICD-10-CM

## 2020-12-03 LAB — COMPREHENSIVE METABOLIC PANEL
ALT: 8 U/L (ref 0–35)
AST: 21 U/L (ref 0–37)
Albumin: 3.9 g/dL (ref 3.5–5.2)
Alkaline Phosphatase: 67 U/L (ref 39–117)
BUN: 47 mg/dL — ABNORMAL HIGH (ref 6–23)
CO2: 27 mEq/L (ref 19–32)
Calcium: 9.7 mg/dL (ref 8.4–10.5)
Chloride: 93 mEq/L — ABNORMAL LOW (ref 96–112)
Creatinine, Ser: 1.4 mg/dL — ABNORMAL HIGH (ref 0.40–1.20)
GFR: 36.66 mL/min — ABNORMAL LOW (ref >60.00)
Glucose, Bld: 82 mg/dL (ref 70–99)
Potassium: 5.1 mEq/L (ref 3.5–5.1)
Sodium: 128 mEq/L — ABNORMAL LOW (ref 135–145)
Total Bilirubin: 0.9 mg/dL (ref 0.2–1.2)
Total Protein: 8.3 g/dL (ref 6.0–8.3)

## 2020-12-03 LAB — CBC WITH DIFFERENTIAL/PLATELET
Basophils Absolute: 0.1 10*3/uL (ref 0.0–0.1)
Basophils Relative: 1.5 % (ref 0.0–3.0)
Eosinophils Absolute: 0.2 10*3/uL (ref 0.0–0.7)
Eosinophils Relative: 5.2 % — ABNORMAL HIGH (ref 0.0–5.0)
HCT: 31.1 % — ABNORMAL LOW (ref 36.0–46.0)
Hemoglobin: 10.1 g/dL — ABNORMAL LOW (ref 12.0–15.0)
Lymphocytes Relative: 15.5 % (ref 12.0–46.0)
Lymphs Abs: 0.7 10*3/uL (ref 0.7–4.0)
MCHC: 32.4 g/dL (ref 30.0–36.0)
MCV: 84.1 fl (ref 78.0–100.0)
Monocytes Absolute: 0.6 10*3/uL (ref 0.1–1.0)
Monocytes Relative: 12.8 % — ABNORMAL HIGH (ref 3.0–12.0)
Neutro Abs: 3 10*3/uL (ref 1.4–7.7)
Neutrophils Relative %: 65 % (ref 43.0–77.0)
Platelets: 205 10*3/uL (ref 150.0–400.0)
RBC: 3.7 Mil/uL — ABNORMAL LOW (ref 3.87–5.11)
RDW: 15.2 % (ref 11.5–15.5)
WBC: 4.6 10*3/uL (ref 4.0–10.5)

## 2020-12-03 NOTE — Progress Notes (Signed)
12/03/2020 AMIEL SHARROW [id number] [birth year]   HISTORY OF PRESENT ILLNESS: This is a 75 year old female is a patient of Dr. Ardell Isaacs.  She just established care with him in February 2022 to follow here for her NASH cirrhosis.  She relocated here from [place].  She is here for her 47-month follow-up and also a new issue of anemia that has been brought to her attention by her PCP, Dr. Chanetta Marshall.  Hgb 9.5 grams 10/2020 compared to 11.8 grams in 05/2020.  She denies any black or bloody stools.  No abdominal pain, really no new complaints today.  Weight is 167 today, the exact same as it was when she was last seen here in February.  Is on torsemide and spironolactone that are prescribed by her cardiologist.  EGD 04/11/2018 by Dr. 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  Recommended avoiding NSAIDs, less than 2 g sodium diet, and repeat EGD in 1 to 2 years   Past Medical History:  Diagnosis Date   Atrial fibrillation (HCC)    Heart disease    aortic and mitral valve problems   Hepatic cirrhosis (HCC)    Hypothyroidism    Past Surgical History:  Procedure Laterality Date   ANKLE SURGERY  1998   AORTIC VALVE REPLACEMENT  2018   CESAREAN SECTION     MITRAL VALVE REPLACEMENT     x 2   TOTAL HIP ARTHROPLASTY Left 2014    reports that she has never smoked. She has never used smokeless tobacco. She reports current alcohol use. No history on file for drug use. family history includes Breast cancer in her maternal grandmother; Diabetes in her brother; Emphysema in her brother; Heart attack in her brother; Heart disease in her brother; Heart disease (age of onset: 52) in her mother;  Rheumatic fever in her mother. No Known Allergies    Outpatient Encounter Medications as of 12/03/2020  Medication Sig   amoxicillin (AMOXIL) 500 MG capsule Take 4 tablets (2000 mg) prior to the dental visit   Cholecalciferol (VITAMIN D3) 50 MCG (2000 UT) TABS Take 6,000 Units by mouth daily.   levothyroxine (SYNTHROID) 150 MCG tablet Take 150 mcg by mouth daily before breakfast.   losartan (COZAAR) 50 MG tablet TAKE 1 TABLET BY MOUTH EVERY DAY   metoprolol succinate (TOPROL-XL) 50 MG 24 hr tablet TAKE 1 TABLET BY MOUTH DAILY. TAKE WITH OR IMMEDIATELY FOLLOWING A MEAL.   spironolactone (ALDACTONE) 25 MG tablet Take 1 tablet (25 mg total) by mouth every other day. (Patient taking differently: Take 25 mg by mouth daily.)   torsemide (DEMADEX) 20 MG tablet TAKE 2 TABLETS (40 MG TOTAL) BY MOUTH DAILY.   warfarin (COUMADIN) 5 MG tablet Take 1 to 2 tablets by mouth once daily or as directed by Coumadin Clinic   No facility-administered encounter medications on file as of 12/03/2020.     REVIEW OF SYSTEMS  : All other systems reviewed and negative except where noted in the History of Present Illness.   PHYSICAL EXAM: BP 112/70   Pulse 88   Ht 5' (1.524 m)   Wt 167 lb (75.8 kg)   BMI 32.61 kg/m  General: Well developed white female in no acute distress  Head: Normocephalic and atraumatic Eyes:  Sclerae anicteric, conjunctiva pink. Ears: Normal auditory acuity Lungs: Clear throughout to auscultation; no W/R/R. Heart: Regular rate and rhythm; valve click noted. Abdomen: Softly distended with ascites fluid but not tense.  BS present.  Non-tender. Musculoskeletal: Symmetrical with no gross deformities  Skin: No lesions on visible extremities Extremities: No edema  Neurological: Alert oriented x 4, grossly non-focal Psychological:  Alert and cooperative. 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  Plan for routine  hepatocellular carcinoma screening with an RUQ [place] and we can also use this to re-evaluate for ascites.  ? Need for paracentesis.  Had at least small to moderate volume ascites previously but weight tends to stay about the same.  Will check CBC and CMP again today.  Continue torsemide 40 mg daily and spironolactone 25 mg every other day, prescribed by cardio.  Maintain 2 g sodium diet.   *Anemia, normocytic:  Hgb 9.5 grams compared to 11.8 grams in 05/2020.  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  Repeat EGD was recommended in 1 to 2 years.  She at least needs EGD.  She is agreeable to that, but declines colonoscopy for now.  No evidence of overt GI bleeding.   *Status post MVR, status post TAVR, A. fib.  Maintained on Coumadin.    *Chronic anticoagulation with coumadin due to mechanical valve:  Will check with prescribing cardiologist, Dr. Elwin Mocha, regarding holding coumadin.  Will likely need to be bridged with lovenox.   CC:  Shon Hale, *

## 2020-12-03 NOTE — Patient Instructions (Addendum)
If you are age 76 or older, your body mass index should be between 23-30. Your Body mass index is 32.61 kg/m. If this is out of the aforementioned range listed, please consider follow up with your Primary Care Provider.  If you are age 79 or younger, your body mass index should be between 19-25. Your Body mass index is 32.61 kg/m. If this is out of the aformentioned range listed, please consider follow up with your Primary Care Provider.   __________________________________________________________  The [facility] providers would like to encourage you to use [facility] to communicate with providers for non-urgent requests or questions.  Due to long hold times on the telephone, sending your provider a message by [facility] may be a faster and more efficient way to get a response.  Please allow 48 business hours for a response.  Please remember that this is for non-urgent requests.   You will be contaced by our office prior to your procedure for directions on holding your Coumadin/Warfarin.  If you do not hear from our office 1 week prior to your scheduled procedure, please call [phone] to discuss.   You will be contacted by [facility] Scheduling in the next 2 days to arrange a complete abdominal ultrasound.  The number on your caller ID will be [phone], please answer when they call. If you have not heard from them in 2 days please call [phone] to schedule.    Your provider has requested that you go to the basement level for lab work before leaving today. Press "B" on the elevator. The lab is located at the first door on the left as you exit the elevator.  Due to recent changes in healthcare laws, you may see the results of your imaging and laboratory studies on MyChart before your provider has had a chance to review them.  We understand that in some cases there may be results that are confusing or concerning to you. Not all laboratory results come back in the same time frame and the  provider may be waiting for multiple results in order to interpret others.  Please give [place] 48 hours in order for your provider to thoroughly review all the results before contacting the office for clarification of your results.

## 2020-12-03 NOTE — Telephone Encounter (Signed)
[facility] Pre-operative Risk Assessment     Request for surgical clearance:     Endoscopy Procedure  What type of surgery is being performed?     EGD   When is this surgery scheduled?     01-15-2021  What type of clearance is required ?   Pharmacy  Are there any medications that need to be held prior to surgery and how long? Yes, Coumadin, 5 days   Practice name and name of physician performing surgery?      [facility]  What is your office phone and fax number?      Phone- [phone]  [phone]  Anesthesia type (None, local, MAC, general) ?       MAC

## 2020-12-04 ENCOUNTER — Other Ambulatory Visit: Payer: Self-pay | Admitting: Cardiovascular Disease

## 2020-12-04 DIAGNOSIS — I824Y9 Acute embolism and thrombosis of unspecified deep veins of unspecified proximal lower extremity: Secondary | ICD-10-CM

## 2020-12-04 DIAGNOSIS — Z7901 Long term (current) use of anticoagulants: Secondary | ICD-10-CM

## 2020-12-04 DIAGNOSIS — I4891 Unspecified atrial fibrillation: Secondary | ICD-10-CM

## 2020-12-04 DIAGNOSIS — Z952 Presence of prosthetic heart valve: Secondary | ICD-10-CM

## 2020-12-04 NOTE — Telephone Encounter (Signed)
   Name: Chelsea Boyer  DOB: Oct 27, 1945  MRN: [id number]  Primary Cardiologist: Thurmon Fair, MD  Chart reviewed as part of pre-operative protocol coverage.   On review of chart, INR has been high with anticoagulation adjustment ongoing.    We will reassess following her upcoming Coumadin clinic visit and repeat INR check.  Lennon Alstrom, PA-C  12/04/2020, 9:55 AM

## 2020-12-05 ENCOUNTER — Ambulatory Visit (INDEPENDENT_AMBULATORY_CARE_PROVIDER_SITE_OTHER): Payer: BC Managed Care – PPO

## 2020-12-05 ENCOUNTER — Other Ambulatory Visit: Payer: Self-pay

## 2020-12-05 DIAGNOSIS — Z952 Presence of prosthetic heart valve: Secondary | ICD-10-CM | POA: Diagnosis not present

## 2020-12-05 DIAGNOSIS — Z7901 Long term (current) use of anticoagulants: Secondary | ICD-10-CM

## 2020-12-05 LAB — POCT INR: INR: 3.8 — AB (ref 2.0–3.0)

## 2020-12-05 NOTE — Patient Instructions (Signed)
Hold dose today and then decrease to 1 tablet (5mg ) daily, except 0.5 tablet Monday, Wednesday and Friday..  INR 3 weeks.   Please call with any questions.  [phone]

## 2020-12-09 NOTE — Telephone Encounter (Signed)
   Patient Name: Chelsea Boyer  DOB: Oct 29, 1945 MRN: [id number]  Primary Cardiologist: Thurmon Fair, MD  Chart reviewed as part of pre-operative protocol coverage. Spoke with patient who reports procedure has been cancelled due to scheduling conflicts and she is not aware of when this may be rescheduled. She will be in communication with the GI team and will reach back out once it is scheduled so we can provide a more up to date recommendation. Will route update to GI team. In the meantime will remove this clearance from preop box but can revisit when it's rescheduled.   Laurann Montana, PA-C 12/09/2020, 3:46 PM

## 2020-12-16 ENCOUNTER — Encounter: Payer: Self-pay | Admitting: Gastroenterology

## 2020-12-16 DIAGNOSIS — K7460 Unspecified cirrhosis of liver: Secondary | ICD-10-CM | POA: Insufficient documentation

## 2020-12-16 DIAGNOSIS — R188 Other ascites: Secondary | ICD-10-CM | POA: Insufficient documentation

## 2020-12-16 DIAGNOSIS — D649 Anemia, unspecified: Secondary | ICD-10-CM | POA: Insufficient documentation

## 2020-12-17 NOTE — Progress Notes (Signed)
Reviewed and agree with management plan.  Mounir Skipper T. Brandye Inthavong, MD FACG 

## 2020-12-26 ENCOUNTER — Other Ambulatory Visit: Payer: Self-pay

## 2020-12-26 ENCOUNTER — Ambulatory Visit (INDEPENDENT_AMBULATORY_CARE_PROVIDER_SITE_OTHER): Payer: BC Managed Care – PPO

## 2020-12-26 DIAGNOSIS — Z7901 Long term (current) use of anticoagulants: Secondary | ICD-10-CM

## 2020-12-26 DIAGNOSIS — Z952 Presence of prosthetic heart valve: Secondary | ICD-10-CM

## 2020-12-26 LAB — POCT INR: INR: 3.9 — AB (ref 2.0–3.0)

## 2020-12-26 NOTE — Patient Instructions (Signed)
Hold dose today and then decrease to 0.5 tablet daily, except 1 tablet Monday, Wednesday and Friday..  INR 3 weeks.   Please call with any questions.  [phone]

## 2020-12-30 ENCOUNTER — Ambulatory Visit (HOSPITAL_COMMUNITY): Payer: BC Managed Care – PPO

## 2021-01-02 ENCOUNTER — Other Ambulatory Visit: Payer: Self-pay | Admitting: Cardiovascular Disease

## 2021-01-02 DIAGNOSIS — I824Y9 Acute embolism and thrombosis of unspecified deep veins of unspecified proximal lower extremity: Secondary | ICD-10-CM

## 2021-01-02 DIAGNOSIS — Z7901 Long term (current) use of anticoagulants: Secondary | ICD-10-CM

## 2021-01-02 DIAGNOSIS — I4891 Unspecified atrial fibrillation: Secondary | ICD-10-CM

## 2021-01-02 DIAGNOSIS — Z952 Presence of prosthetic heart valve: Secondary | ICD-10-CM

## 2021-01-08 ENCOUNTER — Telehealth: Payer: Self-pay

## 2021-01-08 NOTE — Telephone Encounter (Signed)
I called and spoke to the patient who informed me that her EGD for 10/27 was cancelled.  I thanked her for the updated information and told her to inform us of a new procedure date.  She verbalized understanding

## 2021-01-15 ENCOUNTER — Encounter: Payer: BC Managed Care – PPO | Admitting: Gastroenterology

## 2021-01-16 ENCOUNTER — Ambulatory Visit (INDEPENDENT_AMBULATORY_CARE_PROVIDER_SITE_OTHER): Payer: BLUE CROSS/BLUE SHIELD

## 2021-01-16 ENCOUNTER — Other Ambulatory Visit: Payer: Self-pay

## 2021-01-16 DIAGNOSIS — Z952 Presence of prosthetic heart valve: Secondary | ICD-10-CM | POA: Diagnosis not present

## 2021-01-16 DIAGNOSIS — Z7901 Long term (current) use of anticoagulants: Secondary | ICD-10-CM

## 2021-01-16 LAB — POCT INR: INR: 3.5 — AB (ref 2.0–3.0)

## 2021-01-16 NOTE — Patient Instructions (Signed)
Continue 0.5 tablet daily, except 1 tablet Monday, Wednesday and Friday..  INR 6 weeks.   Please call with any questions.  [phone]

## 2021-01-27 ENCOUNTER — Other Ambulatory Visit: Payer: Self-pay | Admitting: Cardiovascular Disease

## 2021-01-27 ENCOUNTER — Ambulatory Visit (HOSPITAL_COMMUNITY)
Admission: RE | Admit: 2021-01-27 | Discharge: 2021-01-27 | Disposition: A | Discharge status: Home / Self Care | Payer: Medicare Other | Source: Ambulatory Visit | Attending: Gastroenterology | Admitting: Gastroenterology

## 2021-01-27 DIAGNOSIS — R188 Other ascites: Secondary | ICD-10-CM | POA: Diagnosis present

## 2021-01-27 DIAGNOSIS — D649 Anemia, unspecified: Secondary | ICD-10-CM | POA: Insufficient documentation

## 2021-01-27 DIAGNOSIS — I4891 Unspecified atrial fibrillation: Secondary | ICD-10-CM

## 2021-01-27 DIAGNOSIS — K7460 Unspecified cirrhosis of liver: Secondary | ICD-10-CM | POA: Diagnosis present

## 2021-01-27 DIAGNOSIS — I824Y9 Acute embolism and thrombosis of unspecified deep veins of unspecified proximal lower extremity: Secondary | ICD-10-CM

## 2021-01-27 DIAGNOSIS — Z7901 Long term (current) use of anticoagulants: Secondary | ICD-10-CM

## 2021-01-27 DIAGNOSIS — Z952 Presence of prosthetic heart valve: Secondary | ICD-10-CM

## 2021-01-27 NOTE — Telephone Encounter (Addendum)
Prescription refill request received for warfarin Lov: 10/22/2020 Next INR check: 12/9 Warfarin tablet strength: 5mg 

## 2021-02-27 ENCOUNTER — Ambulatory Visit (INDEPENDENT_AMBULATORY_CARE_PROVIDER_SITE_OTHER): Payer: Medicare Other

## 2021-02-27 ENCOUNTER — Other Ambulatory Visit: Payer: Self-pay

## 2021-02-27 DIAGNOSIS — Z7901 Long term (current) use of anticoagulants: Secondary | ICD-10-CM

## 2021-02-27 DIAGNOSIS — Z952 Presence of prosthetic heart valve: Secondary | ICD-10-CM

## 2021-02-27 LAB — POCT INR: INR: 2.2 (ref 2.0–3.0)

## 2021-02-27 NOTE — Patient Instructions (Signed)
TAKE 2 tablets tonight only and then Continue 0.5 tablet daily, except 1 tablet Monday, Wednesday and Friday..  INR 4 weeks.   Please call with any questions.  [phone]

## 2021-03-07 ENCOUNTER — Other Ambulatory Visit: Payer: Self-pay | Admitting: Cardiovascular Disease

## 2021-03-27 ENCOUNTER — Other Ambulatory Visit: Payer: Self-pay

## 2021-03-27 ENCOUNTER — Ambulatory Visit (INDEPENDENT_AMBULATORY_CARE_PROVIDER_SITE_OTHER): Payer: Medicare Other

## 2021-03-27 DIAGNOSIS — Z7901 Long term (current) use of anticoagulants: Secondary | ICD-10-CM

## 2021-03-27 DIAGNOSIS — Z952 Presence of prosthetic heart valve: Secondary | ICD-10-CM | POA: Diagnosis not present

## 2021-03-27 LAB — POCT INR: INR: 2.7 (ref 2.0–3.0)

## 2021-03-27 NOTE — Patient Instructions (Signed)
Continue 0.5 tablet daily, except 1 tablet Monday, Wednesday and Friday..  INR 5 weeks.   Please call with any questions.  [phone]

## 2021-04-23 ENCOUNTER — Other Ambulatory Visit: Payer: Self-pay | Admitting: Family Medicine

## 2021-04-23 DIAGNOSIS — Z1231 Encounter for screening mammogram for malignant neoplasm of breast: Secondary | ICD-10-CM

## 2021-04-26 ENCOUNTER — Other Ambulatory Visit: Payer: Self-pay | Admitting: Cardiovascular Disease

## 2021-04-27 ENCOUNTER — Other Ambulatory Visit: Payer: Self-pay | Admitting: Cardiovascular Disease

## 2021-04-27 DIAGNOSIS — Z952 Presence of prosthetic heart valve: Secondary | ICD-10-CM

## 2021-04-27 DIAGNOSIS — Z7901 Long term (current) use of anticoagulants: Secondary | ICD-10-CM

## 2021-04-27 DIAGNOSIS — I4891 Unspecified atrial fibrillation: Secondary | ICD-10-CM

## 2021-04-27 DIAGNOSIS — I824Y9 Acute embolism and thrombosis of unspecified deep veins of unspecified proximal lower extremity: Secondary | ICD-10-CM

## 2021-05-01 ENCOUNTER — Ambulatory Visit (INDEPENDENT_AMBULATORY_CARE_PROVIDER_SITE_OTHER): Payer: Medicare Other | Admitting: Cardiovascular Disease

## 2021-05-01 ENCOUNTER — Other Ambulatory Visit: Payer: Self-pay

## 2021-05-01 ENCOUNTER — Ambulatory Visit (INDEPENDENT_AMBULATORY_CARE_PROVIDER_SITE_OTHER): Payer: Medicare Other

## 2021-05-01 ENCOUNTER — Encounter: Payer: Self-pay | Admitting: Cardiovascular Disease

## 2021-05-01 VITALS — BP 130/62 | HR 69 | Ht 60.0 in | Wt 158.2 lb

## 2021-05-01 DIAGNOSIS — I2721 Secondary pulmonary arterial hypertension: Secondary | ICD-10-CM | POA: Diagnosis not present

## 2021-05-01 DIAGNOSIS — D6869 Other thrombophilia: Secondary | ICD-10-CM

## 2021-05-01 DIAGNOSIS — I071 Rheumatic tricuspid insufficiency: Secondary | ICD-10-CM | POA: Diagnosis not present

## 2021-05-01 DIAGNOSIS — I252 Old myocardial infarction: Secondary | ICD-10-CM | POA: Diagnosis not present

## 2021-05-01 DIAGNOSIS — I4821 Permanent atrial fibrillation: Secondary | ICD-10-CM | POA: Diagnosis not present

## 2021-05-01 DIAGNOSIS — R188 Other ascites: Secondary | ICD-10-CM

## 2021-05-01 DIAGNOSIS — Z7901 Long term (current) use of anticoagulants: Secondary | ICD-10-CM

## 2021-05-01 DIAGNOSIS — K7460 Unspecified cirrhosis of liver: Secondary | ICD-10-CM

## 2021-05-01 DIAGNOSIS — I10 Essential (primary) hypertension: Secondary | ICD-10-CM

## 2021-05-01 DIAGNOSIS — I50812 Chronic right heart failure: Secondary | ICD-10-CM | POA: Diagnosis not present

## 2021-05-01 DIAGNOSIS — Z952 Presence of prosthetic heart valve: Secondary | ICD-10-CM

## 2021-05-01 LAB — POCT INR: INR: 3 (ref 2.0–3.0)

## 2021-05-01 NOTE — Patient Instructions (Signed)

## 2021-05-01 NOTE — Patient Instructions (Signed)
Continue 0.5 tablet daily, except 1 tablet Monday, Wednesday and Friday..  INR 6 weeks.   Please call with any questions.  [phone]  

## 2021-05-01 NOTE — Progress Notes (Signed)
Cardiology Consultation Note:    Date:  05/01/2021   ID:  Chelsea Boyer, DOB Jul 16, 1945, MRN [id number]  PCP:  Shon Haleimberlake, Kathryn S, MD   [facility]  Cardiologist:  Thurmon FairMihai Lynsay Fesperman, MD  Advanced Practice Provider:  No care team member to display Electrophysiologist:  None       Referring MD: Shon Haleimberlake, Kathryn S, *   Chief Complaint  Patient presents with   Cardiac Valve Problem     History of Present Illness:    Chelsea CedarFrances B Witherell is a 76 y.o. female with a hx of suspected rheumatic heart disease and multiple subsequent valvular surgeries.  She has relocated from [place] to be closer to her grandchildren.  She was initially diagnosed with mitral valve disease and underwent mitral valve replacement with a biological prosthesis in 1986 (at that point she was still establishing her own family).  She subsequent underwent replacement of the biological prosthesis with a mechanical valve (Saint Jude 25 mm) in 1996.  During ffollow-up she developed aortic valve stenosis and underwent TAVR with a Edwards SAPIEN valve (23 mm, [id number]) on August 21. 2018.  She has a longstanding history of atrial fibrillation and after failing 3 previous attempts at cardioversion is being managed with rate control for multiple years.  Review of echocardiogram shows that she initially had mild-moderate tricuspid insufficiency as recently as 2013 that progressively worsened.  Since 2015 her tricuspid valve regurgitation has been described as being at least moderate to severe.    Before her TAVR procedure she was diagnosed with a non-STEMI, but as far as I understand, coronary angiography did not show any evidence of significant coronary stenoses (report not available).  Following her TAVR procedure in 2018 she developed ascites and severe lower extremity edema and was diagnosed with cirrhosis, but did not have esophageal varices.  This has been managed successfully with diuretics. She had a  more extensive evaluation for pulmonary hypertension including a normal VQ scan and overnight oximetry and high resolution CT scan of the chest studies to exclude pulmonary embolism and obstructive sleep apnea and interstitial lung disease, respectively.  She continues to feel well.  She denies problems shortness of breath and has not had edema.  She has some abdominal distention and may have ascites, but this is neither uncomfortable nor painful.  She has not had dizziness or syncope and is not aware of any palpitations.  She denies hematemesis or melena, does not complain of easy bruising.  Continues to have NYHA functional class II exertional dyspnea that does not interfere with daily activity.  She denies shortness of breath or chest pain either at rest or with usual activity, no orthopnea, no PND.  She denies daytime hypersomnolence, although she has some difficulty staying asleep at night.  She has not had issues with confusion or memory lapses.  She is followed in our Coumadin clinic and is due to have an INR checked today.  Her previous echo shows normal left ventricular systolic function, normal functioning of the aortic bioprosthesis (TAVR) and mitral mechanical prosthesis, at least moderate to severe tricuspid insufficiency and severe biatrial dilation.  Additional medical problems include well treated hypertension and hypothyroidism.  Past Medical History:  Diagnosis Date   Atrial fibrillation (HCC)    Heart disease    aortic and mitral valve problems   Hepatic cirrhosis (HCC)    Hypothyroidism     Past Surgical History:  Procedure Laterality Date   ANKLE SURGERY  1998  AORTIC VALVE REPLACEMENT  2018   CESAREAN SECTION     MITRAL VALVE REPLACEMENT     x 2   TOTAL HIP ARTHROPLASTY Left 2014    Current Medications: Current Meds  Medication Sig   amoxicillin (AMOXIL) 500 MG capsule Take 4 tablets (2000 mg) prior to the dental visit   Cholecalciferol (VITAMIN D3) 50 MCG  (2000 UT) TABS Take 6,000 Units by mouth daily.   levothyroxine (SYNTHROID) 150 MCG tablet Take 150 mcg by mouth daily before breakfast.   losartan (COZAAR) 50 MG tablet TAKE 1 TABLET BY MOUTH EVERY DAY   metoprolol succinate (TOPROL-XL) 50 MG 24 hr tablet TAKE 1 TABLET BY MOUTH DAILY. TAKE WITH OR IMMEDIATELY FOLLOWING A MEAL.   spironolactone (ALDACTONE) 25 MG tablet Take 1 tablet (25 mg total) by mouth every other day. (Patient taking differently: Take 25 mg by mouth daily.)   torsemide (DEMADEX) 20 MG tablet TAKE 2 TABLETS (40 MG TOTAL) BY MOUTH DAILY.   warfarin (COUMADIN) 5 MG tablet TAKE 1/2-1 TABLET BY MOUTH DAILY AS DIRECTED BY COUMADIN CLINIC     Allergies:   Patient has no known allergies.   Social History   Socioeconomic History   Marital status: Married    Spouse name: Not on file   Number of children: 1   Years of education: Not on file   Highest education level: Not on file  Occupational History   Occupation: retired  Tobacco Use   Smoking status: Never   Smokeless tobacco: Never  Vaping Use   Vaping Use: Never used  Substance and Sexual Activity   Alcohol use: Yes    Comment: rarely   Drug use: Not on file   Sexual activity: Not on file  Other Topics Concern   Not on file  Social History Narrative   Not on file   Social Determinants of Health   Financial Resource Strain: Not on file  Food Insecurity: Not on file  Transportation Needs: Not on file  Physical Activity: Not on file  Stress: Not on file  Social Connections: Not on file     Family History: The patient's family history includes Breast cancer in her maternal grandmother; Diabetes in her brother; Emphysema in her brother; Heart attack in her brother; Heart disease in her brother; Heart disease (age of onset: 54) in her mother; Rheumatic fever in her mother.  ROS:   Please see the history of present illness.     All other systems reviewed and are negative.  EKGs/Labs/Other Studies Reviewed:     The following studies were reviewed today:  10/02/2020 echocardiogram    1. Left ventricular ejection fraction, by estimation, is 60 to 65%. The left ventricle has normal function. The left ventricle has no regional wall motion abnormalities. There is mild concentric left ventricular hypertrophy. Left ventricular diastolic  function could not be evaluated.   2. Right ventricular systolic function is normal. The right ventricular size is normal. There is moderately elevated pulmonary artery systolic pressure. The estimated right ventricular systolic pressure is 45.8 mmHg.   3. Right atrial size was severely dilated.   4. Left atrial size was severely dilated.   5. The mitral valve has been repaired/replaced. Cannot assess for MR due to shadowing from mechanical MVR. There is a 25 mm St. Jude biological prosthesis with mechanical present in the mitral position. Procedure Date: 1996. MV peak gradient, 18.1 mmHg.  The average mean mitral valve gradient is 6.5 mmHg.   6. The  aortic valve has been repaired/replaced. Periprosthetic aortic valve regurgitation is trivial. No aortic stenosis is present. There is a unknown Sapien prosthetic (TAVR) valve present in the aortic position. Procedure Date: 2018. Aortic valve mean gradient measures 20.0 mmHg. Aortic valve Vmax measures 2.93 m/s.   7. Tricuspid valve regurgitation is moderate to severe.   8. The inferior vena cava is normal in size with greater than 50% respiratory variability, suggesting right atrial pressure of 3 mmHg.   9. Compared to echo 2021, mean AVG has decreased from 22 to and average mean MVG is stable from prior echo of 2021 ( ). Moderate to severe TR is unchanged. Diastolic function cannot be assess due to underlying atrial fibrillation as well as MVR. PASP has not changed ( in 2021).    Office records, labs, nuclear stress test and numerous echocardiograms from [facility].    The most recent echocardiogram  is from 03/06/2020.  This shows normal left ventricular size and function normally functioning aortic valve prosthesis (peak velocity 3 m/s, maximum gradient 35 mmHg, mean gradient 22 mmHg, trace aortic insufficiency, not specified whether perivalvular or intravalvular), normally functioning mechanical mitral valve prosthesis with a mean gradient of 7 mmHg (heart rate not specified, pressure half-time not reported), moderate to severe tricuspid insufficiency, calculated PA systolic pressure 44 mmHg assuming RA pressure 3 mmHg.  The most recent nuclear perfusion study is dated June 14, 2012 and shows a an apical attenuation defect, no ischemia seen, EF 55%  Echocardiogram 10/02/2020:  1. Left ventricular ejection fraction, by estimation, is 60 to 65%. The  left ventricle has normal function. The left ventricle has no regional  wall motion abnormalities. There is mild concentric left ventricular  hypertrophy. Left ventricular diastolic  function could not be evaluated.   2. Right ventricular systolic function is normal. The right ventricular  size is normal. There is moderately elevated pulmonary artery systolic  pressure. The estimated right ventricular systolic pressure is 45.8 mmHg.   3. Right atrial size was severely dilated.   4. Left atrial size was severely dilated.   5. The mitral valve has been repaired/replaced. Cannot assess for MR due  to shadowing from mechanical MVR. There is a 25 mm St. Jude biological  prosthesis with mechanical present in the mitral position. Procedure Date:  1996. MV peak gradient, 18.1 mmHg.   The average mean mitral valve gradient is 6.5 mmHg.   6. The aortic valve has been repaired/replaced. Periprosthetic aortic  valve regurgitation is trivial. No aortic stenosis is present. There is a  unknown Sapien prosthetic (TAVR) valve present in the aortic position.  Procedure Date: 2018. Aortic valve  mean gradient measures 20.0 mmHg. Aortic valve Vmax measures  2.93 m/s.   7. Tricuspid valve regurgitation is moderate to severe.   8. The inferior vena cava is normal in size with greater than 50%  respiratory variability, suggesting right atrial pressure of 3 mmHg.   9. Compared to echo 2021, mean AVG has decreased from 22 to and  average mean MVG is stable from prior echo of 2021 ( ). Moderate to  severe TR is unchanged. Diastolic function cannot be assess due to  underlying atrial fibrillation as well as  MVR. PASP has not changed ( in 2021).   EKG:  EKG is ordered today.  Personally reviewed, it shows atrial fibrillation with controlled ventricular response and left bundle branch block with left axis deviation, unchanged from previous tracing Recent Labs: 12/03/2020: ALT 8; BUN 47; Creatinine,  Ser 1.40; Hemoglobin 10.1; Platelets 205.0; Potassium 5.1; Sodium 128   Previous labs from [place] appear to show a baseline BUN of 50 and creatinine of 1.33, normal liver function tests including albumin of 4.3, cholesterol 162, triglycerides 94, HDL 49, calculated LDL 91, hemoglobin 12.5 Recent Lipid Panel No results found for: CHOL, TRIG, HDL, CHOLHDL, VLDL, LDLCALC, LDLDIRECT   Risk Assessment/Calculations:    CHA2DS2-VASc Score =   Not appropriate (mechanical heart valve) This indicates a  % annual risk of stroke. The patient's score is based upon:      Physical Exam:    VS:  BP 130/62    Pulse 69    Ht 5' (1.524 m)    Wt 158 lb 3.2 oz (71.8 kg)    SpO2 91%    BMI 30.90 kg/m     Wt Readings from Last 3 Encounters:  05/01/21 158 lb 3.2 oz (71.8 kg)  12/03/20 167 lb (75.8 kg)  10/22/20 168 lb (76.2 kg)     General: Alert, oriented x3, no distress, appears well Head: no evidence of trauma, PERRL, EOMI, no exophtalmos or lid lag, no myxedema, no xanthelasma; normal ears, nose and oropharynx Neck: normal jugular venous pulsations and no hepatojugular reflux; brisk carotid pulses without delay and no carotid bruits Chest: clear  to auscultation, no signs of consolidation by percussion or palpation, normal fremitus, symmetrical and full respiratory excursions Cardiovascular: normal position and quality of the apical impulse, irregular rhythm, crisp prosthetic valve clicks, 3/6 holosystolic murmur heard throughout the entire precordium, loudest at the left lower sternal border, no apical systolic murmur, no diastolic murmurs, rubs or gallops Abdomen: Mildly distended, suspect ascites, no masses by palpation, no abnormal pulsatility or arterial bruits, normal bowel sounds, no hepatosplenomegaly Extremities: no clubbing, cyanosis or edema; 2+ radial, ulnar and brachial pulses bilaterally; 2+ right femoral, posterior tibial and dorsalis pedis pulses; 2+ left femoral, posterior tibial and dorsalis pedis pulses; no subclavian or femoral bruits Neurological: grossly nonfocal Psych: Normal mood and affect   ASSESSMENT:    1. Chronic right-sided heart failure (HCC)   2. PAH (pulmonary artery hypertension) (HCC)   3. Rheumatic tricuspid valve regurgitation   4. H/O mitral valve replacement with mechanical valve   5. History of transcatheter aortic valve replacement (TAVR)   6. History of myocardial infarction   7. Permanent atrial fibrillation (HCC)   8. Essential hypertension   9. Cirrhosis of liver with ascites, unspecified hepatic cirrhosis type (HCC)   10. Acquired thrombophilia (HCC)      PLAN:    In order of problems listed above:  Right heart failure/PAH: Appears close to euvolemia, although she may have a little bit of ascites.  Current dose of diuretic appears appropriate.  The pulmonary hypertension is relatively mild and I suspect that tricuspid insufficiency is the driver for right heart failure. Severe tricuspid regurgitation: It is hard to say whether there is rheumatic involvement of the tricuspid valve or this is simply due to sequelae from longstanding mitral valve disease.  She has had 2 previous  sternotomies and is not a candidate for a third open heart procedure.  Offered referral for clinical trial of tricuspid valve clip, but she has declined. Mechanical MVR: She has a chronic moderately elevated gradient across the mitral valve(6.5 mmHg at 66 bpm).  Plan a repeat echocardiogram in July 2023.  Aware of the need for endocarditis prophylaxis. TAVR: Normal gradients.  There is trivial perivalvular leak and transfer started gradients are acceptable and  unchanged from previous echocardiograms in [place]. Hx of NSTEMI: I suspect that this was not due to conventional coronary disease.  Could have been cardioembolic or demand ischemia in the setting of severe aortic stenosis.  There is no mention of coronary disease in her records. Permanent atrial fibrillation: Severe biatrial dilation and severe tricuspid insufficiency, she will not return to normal rhythm.  Fully anticoagulated with warfarin. HTN: Well-controlled.  Continue beta-blocker necessary for rate control and spironolactone with benefits for liver disease related hemodynamic complications.  Option to switch to a nonselective beta-blocker if necessary for portal hypertension. 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  She remains well compensated without evidence of encephalopathy, parenchymal insufficiency on labs or gastrointestinal bleeding. Anticoagulation: Denies bleeding complications.  INR checked today.     Medication Adjustments/Labs and Tests Ordered: Current medicines are reviewed at length with the patient today.  Concerns regarding medicines are outlined above.  Orders Placed This Encounter  Procedures   EKG 12-Lead    No orders of the defined types were placed in this encounter.    Patient Instructions  Medication Instructions:  No changes *If you need a refill on your cardiac medications before your next appointment, please call your pharmacy*   Lab Work: None ordered If you have labs  (blood work) drawn today and your tests are completely normal, you will receive your results only by: MyChart Message (if you have MyChart) OR A paper copy in the mail If you have any lab test that is abnormal or we need to change your treatment, we will call you to review the results.   Testing/Procedures: None ordered   Follow-Up: At [facility], you and your health needs are our priority.  As part of our continuing mission to provide you with exceptional heart care, we have created designated Provider Care Teams.  These Care Teams include your primary Cardiologist (physician) and Advanced Practice Providers (APPs -  Physician Assistants and Nurse Practitioners) who all work together to provide you with the care you need, when you need it.  We recommend signing up for the patient portal called "MyChart".  Sign up information is provided on this After Visit Summary.  MyChart is used to connect with patients for Virtual Visits (Telemedicine).  Patients are able to view lab/test results, encounter notes, upcoming appointments, etc.  Non-urgent messages can be sent to your provider as well.   To learn more about what you can do with MyChart, go to [email or web address].    Your next appointment:   12 month(s)  The format for your next appointment:   In Person  Provider:   Thurmon Fair, MD {     Signed, Thurmon Fair, MD  05/01/2021 10:00 AM    [facility]

## 2021-05-25 ENCOUNTER — Encounter: Payer: Self-pay | Admitting: Gastroenterology

## 2021-05-25 ENCOUNTER — Other Ambulatory Visit (INDEPENDENT_AMBULATORY_CARE_PROVIDER_SITE_OTHER): Payer: Medicare Other

## 2021-05-25 ENCOUNTER — Ambulatory Visit (INDEPENDENT_AMBULATORY_CARE_PROVIDER_SITE_OTHER): Payer: Medicare Other | Admitting: Gastroenterology

## 2021-05-25 VITALS — BP 118/60 | HR 84 | Ht 60.0 in | Wt 158.6 lb

## 2021-05-25 DIAGNOSIS — K7460 Unspecified cirrhosis of liver: Secondary | ICD-10-CM | POA: Diagnosis not present

## 2021-05-25 DIAGNOSIS — R188 Other ascites: Secondary | ICD-10-CM

## 2021-05-25 LAB — COMPREHENSIVE METABOLIC PANEL
ALT: 9 U/L (ref 0–35)
AST: 21 U/L (ref 0–37)
Albumin: 3.9 g/dL (ref 3.5–5.2)
Alkaline Phosphatase: 72 U/L (ref 39–117)
BUN: 63 mg/dL — ABNORMAL HIGH (ref 6–23)
CO2: 27 mEq/L (ref 19–32)
Calcium: 9.6 mg/dL (ref 8.4–10.5)
Chloride: 95 mEq/L — ABNORMAL LOW (ref 96–112)
Creatinine, Ser: 1.68 mg/dL — ABNORMAL HIGH (ref 0.40–1.20)
GFR: 29.36 mL/min — ABNORMAL LOW (ref >60.00)
Glucose, Bld: 85 mg/dL (ref 70–99)
Potassium: 4.9 mEq/L (ref 3.5–5.1)
Sodium: 131 mEq/L — ABNORMAL LOW (ref 135–145)
Total Bilirubin: 1 mg/dL (ref 0.2–1.2)
Total Protein: 8.4 g/dL — ABNORMAL HIGH (ref 6.0–8.3)

## 2021-05-25 LAB — CBC WITH DIFFERENTIAL/PLATELET
Basophils Absolute: 0.1 10*3/uL (ref 0.0–0.1)
Basophils Relative: 1.9 % (ref 0.0–3.0)
Eosinophils Absolute: 0.3 10*3/uL (ref 0.0–0.7)
Eosinophils Relative: 6 % — ABNORMAL HIGH (ref 0.0–5.0)
HCT: 32.7 % — ABNORMAL LOW (ref 36.0–46.0)
Hemoglobin: 10.9 g/dL — ABNORMAL LOW (ref 12.0–15.0)
Lymphocytes Relative: 15.9 % (ref 12.0–46.0)
Lymphs Abs: 0.7 10*3/uL (ref 0.7–4.0)
MCHC: 33.5 g/dL (ref 30.0–36.0)
MCV: 87.2 fl (ref 78.0–100.0)
Monocytes Absolute: 0.5 10*3/uL (ref 0.1–1.0)
Monocytes Relative: 12.3 % — ABNORMAL HIGH (ref 3.0–12.0)
Neutro Abs: 2.8 10*3/uL (ref 1.4–7.7)
Neutrophils Relative %: 63.9 % (ref 43.0–77.0)
Platelets: 170 10*3/uL (ref 150.0–400.0)
RBC: 3.75 Mil/uL — ABNORMAL LOW (ref 3.87–5.11)
RDW: 16.1 % — ABNORMAL HIGH (ref 11.5–15.5)
WBC: 4.5 10*3/uL (ref 4.0–10.5)

## 2021-05-25 NOTE — Progress Notes (Addendum)
? ? ? ?05/25/2021 ?Chelsea Boyer ?[id number] ?12/08/1945 ? ? ?HISTORY OF PRESENT ILLNESS: This is a 76 year old female who is a patient of Dr. Ardell Isaacs.  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  She relocated here from [place] not long ago.  She is here for regular follow-up.  She was seen by me back in September 2022 for a drop in her hemoglobin.  Hemoglobin is down to 9.5 g in August 2022 compared to 11.8 g in March 2022.  She denied any sign of GI bleeding.  She was scheduled for an endoscopy, but canceled that.  She still does not wish to proceed.  She knows that she will have to bridged to Lovenox if she has to stop her Coumadin and does not want to have to do that.  She continues to deny any sign of bleeding.  She had an ultrasound in November for [facility] surveillance.  That confirmed cirrhosis, but no suspicious lesions.  She had a large amount of ascites so we offered paracentesis, but she declined.  She is on torsemide 40 mg daily and spironolactone 25 mg daily that is prescribed by cardiology.  Her weight has been stable, actually down about 10 pounds from August or September 2022.  She said that she actually feels pretty well.  No lower extremity swelling.  No abdominal pain.  No confusion. ? ?EGD 04/11/2018 by Dr. 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  Recommended avoiding NSAIDs, less than 2 g sodium diet, and repeat EGD in 1 to 2 years ?  ? ?Past Medical History:  ?Diagnosis Date  ? Atrial fibrillation (HCC)   ? Heart disease   ? aortic and mitral valve problems  ? Hepatic  cirrhosis (HCC)   ? Hypothyroidism   ? ?Past Surgical History:  ?Procedure Laterality Date  ? ANKLE SURGERY  1998  ? AORTIC VALVE REPLACEMENT  2018  ? CESAREAN SECTION    ? MITRAL VALVE REPLACEMENT    ? x 2  ? TOTAL HIP ARTHROPLASTY Left 2014  ? ? reports that she has never smoked. She has never used smokeless tobacco. She reports current alcohol use. No history on file for drug use. ?family history includes Breast cancer in her maternal grandmother; Diabetes in her brother; Emphysema in her brother; Heart attack in her brother; Heart disease in her brother; Heart disease (age of onset: 35) in her mother; Rheumatic fever in her mother. ?No Known Allergies ? ?  ?Outpatient Encounter Medications as of 05/25/2021  ?Medication Sig  ? alendronate (FOSAMAX) 70 MG tablet Take 70 mg by mouth once a week.  ? amoxicillin (AMOXIL) 500 MG capsule Take 4 tablets (2000 mg) prior to the dental visit  ? Cholecalciferol (VITAMIN D3) 50 MCG (2000 UT) TABS Take 6,000 Units by mouth daily.  ? levothyroxine (SYNTHROID) 150 MCG tablet Take 150 mcg by mouth daily before breakfast.  ? losartan (COZAAR) 50 MG tablet TAKE 1 TABLET BY MOUTH EVERY DAY  ? metoprolol succinate (TOPROL-XL) 50 MG 24 hr tablet TAKE 1 TABLET BY MOUTH DAILY. TAKE WITH OR IMMEDIATELY FOLLOWING A MEAL.  ?  spironolactone (ALDACTONE) 25 MG tablet Take 1 tablet (25 mg total) by mouth every other day. (Patient taking differently: Take 25 mg by mouth daily.)  ? torsemide (DEMADEX) 20 MG tablet TAKE 2 TABLETS (40 MG TOTAL) BY MOUTH DAILY.  ? warfarin (COUMADIN) 5 MG tablet TAKE 1/2-1 TABLET BY MOUTH DAILY AS DIRECTED BY COUMADIN CLINIC  ? ?No facility-administered encounter medications on file as of 05/25/2021.  ? ? ? ?REVIEW OF SYSTEMS  : All other systems reviewed and negative except where noted in the History of Present Illness. ? ? ?PHYSICAL EXAM: ?BP 118/60   Pulse 84   Ht 5' (1.524 m)   Wt 158 lb 9.6 oz (71.9 kg)   BMI 30.97 kg/m?  ?General:  Well developed white  female in no acute distress ?Head: Normocephalic and atraumatic ?Eyes:  Sclerae anicteric, conjunctiva pink. ?Ears: Normal auditory acuity ?Lungs: Clear throughout to auscultation; no W/R/R. ?Heart: Regular rate and rhythm; no M/R/G.  Valve click noted. ?Abdomen: Softly distended with ascites fluid.  Epigastric incisional hernia noted.  BS present.  Non-tender. ?Musculoskeletal: Symmetrical with no gross deformities  ?Skin: No lesions on visible extremities ?Extremities: No edema  ?Neurological: Alert oriented x 4, grossly non-focal ?Psychological:  Alert and cooperative. Normal mood and affect ? 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  Will plan for labs today including CBC, CMP, and AFP.  Continue torsemide 40 mg daily and spironolactone 25 mg every day, prescribed by cardio.  Maintain 2 g sodium diet.  Weight is stable.  She declines paracentesis at this time.  We will plan for surveillance imaging/ultrasound again at 6 months interval, May 2023.  Likely will plan for office visit follow-up in 6 months or sooner as needed. ?  ?*Anemia, normocytic:  Hgb 9.5 grams in September compared to 11.8 grams in 05/2020.  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  Repeat EGD was recommended in 1 to 2 years.  She was scheduled for EGD when she was seen in 11/2020 but canceled and does not wish to proceed at this time.  Does not want to have to stop coumadin and bridge to Lovenox.  Denies any sign of GI bleeding. ?  ?*Status post MVR, status post TAVR, A. fib.  Maintained on Coumadin.  ?  ?*Chronic anticoagulation with coumadin due to mechanical valve ? ? ?CC:  Shon Hale, * ? ?  ?

## 2021-05-25 NOTE — Patient Instructions (Signed)
Your provider has requested that you go to the basement level for lab work before leaving today. Press "B" on the elevator. The lab is located at the first door on the left as you exit the elevator. ? ?Ultrasound due in May 2023. ? ?If you are age 77 or older, your body mass index should be between 23-30. Your Body mass index is 30.97 kg/m?Marland Kitchen If this is out of the aforementioned range listed, please consider follow up with your Primary Care Provider. ?________________________________________________________ ? ?The [facility] providers would like to encourage you to use [facility] to communicate with providers for non-urgent requests or questions.  Due to long hold times on the telephone, sending your provider a message by [facility] may be a faster and more efficient way to get a response.  Please allow 48 business hours for a response.  Please remember that this is for non-urgent requests.  ?_______________________________________________________ ? ?

## 2021-05-26 LAB — AFP TUMOR MARKER: AFP-Tumor Marker: 2.2 ng/mL

## 2021-05-29 ENCOUNTER — Telehealth: Payer: Self-pay | Admitting: *Deleted

## 2021-05-29 MED ORDER — TORSEMIDE 20 MG PO TABS: 20.0000 mg | ORAL_TABLET | Freq: Every day | ORAL | 11 refills | Status: DC

## 2021-05-29 NOTE — Telephone Encounter (Signed)
Left a message for the patient to call back. ? ?Croitoru, Mihai, MD  Lucky Cowboy, RN; Sandi Mariscal, RN ?Thanks for the follow up.  ?Misty Stanley, can we please ask her to reduce the torsemide to 20mg  daily, but to call  if she gains 5 lb or more compared to today's weight. Thank you.   ? ?

## 2021-05-29 NOTE — Telephone Encounter (Signed)
Patient has been made aware and verbalized her understanding.  

## 2021-06-04 ENCOUNTER — Ambulatory Visit
Admission: RE | Admit: 2021-06-04 | Discharge: 2021-06-04 | Disposition: A | Discharge status: Home / Self Care | Payer: Medicare Other | Source: Ambulatory Visit | Attending: Family Medicine | Admitting: Family Medicine

## 2021-06-12 ENCOUNTER — Ambulatory Visit (INDEPENDENT_AMBULATORY_CARE_PROVIDER_SITE_OTHER): Payer: Medicare Other

## 2021-06-12 ENCOUNTER — Other Ambulatory Visit: Payer: Self-pay

## 2021-06-12 DIAGNOSIS — Z7901 Long term (current) use of anticoagulants: Secondary | ICD-10-CM | POA: Diagnosis not present

## 2021-06-12 DIAGNOSIS — Z952 Presence of prosthetic heart valve: Secondary | ICD-10-CM

## 2021-06-12 LAB — POCT INR: INR: 2.3 (ref 2.0–3.0)

## 2021-06-12 NOTE — Patient Instructions (Signed)
TAKE 2 TABLETS TODAY ONLY and then Continue 0.5 tablet daily, except 1 tablet Monday, Wednesday and Friday..  INR 6 weeks.   Please call with any questions.  [phone] ?

## 2021-07-17 ENCOUNTER — Telehealth: Payer: Self-pay | Admitting: Cardiovascular Disease

## 2021-07-17 ENCOUNTER — Other Ambulatory Visit: Payer: Self-pay | Admitting: Cardiovascular Disease

## 2021-07-17 MED ORDER — AMOXICILLIN 500 MG PO CAPS: ORAL_CAPSULE | ORAL | 1 refills | Status: DC

## 2021-07-17 NOTE — Telephone Encounter (Signed)
?*  STAT* If patient is at the pharmacy, call can be transferred to refill team. ? ? ?1. Which medications need to be refilled? (please list name of each medication and dose if known) amoxicillin (AMOXIL) 500 MG capsule ? ?2. Which pharmacy/location (including street and city if local pharmacy) is medication to be sent to? [id number] - [place], [place] - [address] AT [address] OF [facility] [address] ? ?3. Do they need a 30 day or 90 day supply? 90 ? ?

## 2021-07-24 ENCOUNTER — Ambulatory Visit (INDEPENDENT_AMBULATORY_CARE_PROVIDER_SITE_OTHER): Payer: Medicare Other

## 2021-07-24 DIAGNOSIS — Z952 Presence of prosthetic heart valve: Secondary | ICD-10-CM | POA: Diagnosis not present

## 2021-07-24 DIAGNOSIS — Z7901 Long term (current) use of anticoagulants: Secondary | ICD-10-CM | POA: Diagnosis not present

## 2021-07-24 LAB — POCT INR: INR: 2.5 (ref 2.0–3.0)

## 2021-07-24 NOTE — Patient Instructions (Signed)
Continue 0.5 tablet daily, except 1 tablet Monday, Wednesday and Friday..  INR 6 weeks.   Please call with any questions.  [phone]  

## 2021-07-27 ENCOUNTER — Telehealth: Payer: Self-pay | Admitting: Gastroenterology

## 2021-07-27 DIAGNOSIS — R188 Other ascites: Secondary | ICD-10-CM

## 2021-07-27 NOTE — Telephone Encounter (Signed)
Patient called to follow up on [place] needed. ?

## 2021-07-27 NOTE — Telephone Encounter (Signed)
The order has been entered and sent to the schedulers.  The pt has been advised  ?

## 2021-07-28 ENCOUNTER — Telehealth: Payer: Self-pay | Admitting: Cardiovascular Disease

## 2021-07-28 NOTE — Telephone Encounter (Signed)
Pt c/o swelling: STAT is pt has developed SOB within 24 hours ? ?If swelling, where is the swelling located? Feet ? ?How much weight have you gained and in what time span? 5-6 pounds since 05/25/21 ? ?Have you gained 3 pounds in a day or 5 pounds in a week? NA ? ?Do you have a log of your daily weights (if so, list)? 163- this morning ?163.8 - yesterday ?Are you currently taking a fluid pill? Yes ? ?Are you currently SOB? No ? ?Have you traveled recently? no ? ?

## 2021-07-28 NOTE — Telephone Encounter (Signed)
Spoke with patient of Dr. Loletha Grayer who reports 5-6lbs weight gain since May 25, 2021. She reports gradual weight gain.  ? ?She is on torsemide 20mg  daily. She has good UOP.  ?She had been on torsemide 20mg  x2 previously ? ?She states she has noticed recently (7-10 days) more shortness of breath with similar activities, such as walking up hills or stairs. She reports more "pressure" when vacuuming.  ? ?Toward end of the day, her pedal edema has become more prominent than previous  ? ?She monitors/has cut back on salt and sodium intake.  ? ?Advised will route to MD to review, as torsemide was decreased previously based on renal function  ? ?

## 2021-07-28 NOTE — Telephone Encounter (Signed)
Please increase the torsemide to 2 x 20 mg daily until her weight is under 160 lb. She can continue adjusting the dose as needed based on weight: ?- if weight is under 160 lb take 20 mg torsemide daily ?- if weight is 160 lb or higher take torsemide 40 mg daily ? ?May need an updated Rx ?

## 2021-07-29 MED ORDER — TORSEMIDE 20 MG PO TABS: 20.0000 mg | ORAL_TABLET | Freq: Every day | ORAL | 11 refills | Status: DC

## 2021-07-29 NOTE — Telephone Encounter (Signed)
Patient made aware and new prescription has been sent in for her. ? ? ?

## 2021-08-03 ENCOUNTER — Ambulatory Visit (HOSPITAL_COMMUNITY)
Admission: RE | Admit: 2021-08-03 | Discharge: 2021-08-03 | Disposition: A | Discharge status: Home / Self Care | Payer: Medicare Other | Source: Ambulatory Visit | Attending: Gastroenterology | Admitting: Gastroenterology

## 2021-08-03 DIAGNOSIS — K7460 Unspecified cirrhosis of liver: Secondary | ICD-10-CM | POA: Diagnosis not present

## 2021-08-03 DIAGNOSIS — R188 Other ascites: Secondary | ICD-10-CM | POA: Insufficient documentation

## 2021-09-08 ENCOUNTER — Ambulatory Visit (INDEPENDENT_AMBULATORY_CARE_PROVIDER_SITE_OTHER): Payer: Medicare Other | Admitting: *Deleted

## 2021-09-08 DIAGNOSIS — I824Y9 Acute embolism and thrombosis of unspecified deep veins of unspecified proximal lower extremity: Secondary | ICD-10-CM

## 2021-09-08 DIAGNOSIS — I4821 Permanent atrial fibrillation: Secondary | ICD-10-CM | POA: Diagnosis not present

## 2021-09-08 DIAGNOSIS — Z952 Presence of prosthetic heart valve: Secondary | ICD-10-CM | POA: Diagnosis not present

## 2021-09-08 DIAGNOSIS — Z7901 Long term (current) use of anticoagulants: Secondary | ICD-10-CM

## 2021-09-08 LAB — POCT INR: INR: 2.1 (ref 2.0–3.0)

## 2021-09-08 NOTE — Patient Instructions (Signed)
Description   Today take 1 tablet then continue 0.5 tablet daily, except 1 tablet Monday, Wednesday and Friday. Recheck INR 5 weeks.  Please call with any questions.  [phone]

## 2021-10-14 ENCOUNTER — Ambulatory Visit (HOSPITAL_COMMUNITY): Payer: Medicare Other | Attending: Cardiology

## 2021-10-14 DIAGNOSIS — I071 Rheumatic tricuspid insufficiency: Secondary | ICD-10-CM | POA: Diagnosis present

## 2021-10-14 LAB — ECHOCARDIOGRAM COMPLETE
AR max vel: 2.06 cm2
AV Area VTI: 2.22 cm2
AV Area mean vel: 2.04 cm2
AV Mean grad: 15 mmHg
AV Peak grad: 26.6 mmHg
Ao pk vel: 2.58 m/s
Area-P 1/2: 1.55 cm2
MV VTI: 3.01 cm2
P 1/2 time: 353 msec
S' Lateral: 3 cm

## 2021-10-16 ENCOUNTER — Ambulatory Visit (INDEPENDENT_AMBULATORY_CARE_PROVIDER_SITE_OTHER): Payer: Medicare Other

## 2021-10-16 DIAGNOSIS — Z7901 Long term (current) use of anticoagulants: Secondary | ICD-10-CM | POA: Diagnosis not present

## 2021-10-16 DIAGNOSIS — I4821 Permanent atrial fibrillation: Secondary | ICD-10-CM

## 2021-10-16 DIAGNOSIS — Z952 Presence of prosthetic heart valve: Secondary | ICD-10-CM | POA: Diagnosis not present

## 2021-10-16 LAB — POCT INR: INR: 1.9 — AB (ref 2.0–3.0)

## 2021-10-16 NOTE — Patient Instructions (Signed)
Description   Take 1.5 tablets today and then START taking 1 tablet daily EXCEPT 1/2 tablet on Sundays, Tuesdays, and Thursdays.  Recheck INR 2 weeks.   Please call with any questions.   [facility] [phone]

## 2021-10-30 ENCOUNTER — Ambulatory Visit (INDEPENDENT_AMBULATORY_CARE_PROVIDER_SITE_OTHER): Payer: Medicare Other

## 2021-10-30 DIAGNOSIS — I4821 Permanent atrial fibrillation: Secondary | ICD-10-CM

## 2021-10-30 DIAGNOSIS — Z952 Presence of prosthetic heart valve: Secondary | ICD-10-CM | POA: Diagnosis not present

## 2021-10-30 DIAGNOSIS — Z7901 Long term (current) use of anticoagulants: Secondary | ICD-10-CM | POA: Diagnosis not present

## 2021-10-30 LAB — POCT INR: INR: 2.6 (ref 2.0–3.0)

## 2021-10-30 NOTE — Patient Instructions (Signed)
Description   Continue taking 1 tablet daily EXCEPT 1/2 tablet on Sundays, Tuesdays, and Thursdays.  Recheck INR 3 weeks.   Please call with any questions.   [facility] [phone]

## 2021-11-20 ENCOUNTER — Ambulatory Visit: Payer: Medicare Other | Attending: Cardiology

## 2021-11-20 DIAGNOSIS — Z7901 Long term (current) use of anticoagulants: Secondary | ICD-10-CM | POA: Insufficient documentation

## 2021-11-20 DIAGNOSIS — Z952 Presence of prosthetic heart valve: Secondary | ICD-10-CM | POA: Insufficient documentation

## 2021-11-20 LAB — POCT INR: INR: 2.7 (ref 2.0–3.0)

## 2021-11-20 NOTE — Patient Instructions (Signed)
Continue taking 1 tablet daily EXCEPT 1/2 tablet on Sundays, Tuesdays, and Thursdays.  Recheck INR 3 weeks.   Please call with any questions.   [facility] [phone]

## 2021-12-11 ENCOUNTER — Ambulatory Visit: Payer: Medicare Other

## 2021-12-15 ENCOUNTER — Ambulatory Visit: Payer: Medicare Other

## 2021-12-16 ENCOUNTER — Encounter: Payer: Self-pay | Admitting: Gastroenterology

## 2021-12-16 ENCOUNTER — Ambulatory Visit (INDEPENDENT_AMBULATORY_CARE_PROVIDER_SITE_OTHER): Payer: Medicare Other | Admitting: Gastroenterology

## 2021-12-16 ENCOUNTER — Other Ambulatory Visit (INDEPENDENT_AMBULATORY_CARE_PROVIDER_SITE_OTHER): Payer: Medicare Other

## 2021-12-16 ENCOUNTER — Ambulatory Visit: Payer: Medicare Other | Attending: Cardiovascular Disease | Admitting: *Deleted

## 2021-12-16 VITALS — BP 124/66 | HR 60 | Ht 60.0 in | Wt 154.4 lb

## 2021-12-16 DIAGNOSIS — R188 Other ascites: Secondary | ICD-10-CM

## 2021-12-16 DIAGNOSIS — I4821 Permanent atrial fibrillation: Secondary | ICD-10-CM | POA: Diagnosis not present

## 2021-12-16 DIAGNOSIS — Z952 Presence of prosthetic heart valve: Secondary | ICD-10-CM | POA: Diagnosis not present

## 2021-12-16 DIAGNOSIS — K7460 Unspecified cirrhosis of liver: Secondary | ICD-10-CM | POA: Diagnosis not present

## 2021-12-16 DIAGNOSIS — I824Y9 Acute embolism and thrombosis of unspecified deep veins of unspecified proximal lower extremity: Secondary | ICD-10-CM | POA: Insufficient documentation

## 2021-12-16 DIAGNOSIS — Z7901 Long term (current) use of anticoagulants: Secondary | ICD-10-CM | POA: Diagnosis not present

## 2021-12-16 LAB — COMPREHENSIVE METABOLIC PANEL
ALT: 10 U/L (ref 0–35)
AST: 23 U/L (ref 0–37)
Albumin: 3.7 g/dL (ref 3.5–5.2)
Alkaline Phosphatase: 73 U/L (ref 39–117)
BUN: 51 mg/dL — ABNORMAL HIGH (ref 6–23)
CO2: 32 mEq/L (ref 19–32)
Calcium: 9.4 mg/dL (ref 8.4–10.5)
Chloride: 96 mEq/L (ref 96–112)
Creatinine, Ser: 1.25 mg/dL — ABNORMAL HIGH (ref 0.40–1.20)
GFR: 41.7 mL/min — ABNORMAL LOW (ref >60.00)
Glucose, Bld: 101 mg/dL — ABNORMAL HIGH (ref 70–99)
Potassium: 4 mEq/L (ref 3.5–5.1)
Sodium: 134 mEq/L — ABNORMAL LOW (ref 135–145)
Total Bilirubin: 0.9 mg/dL (ref 0.2–1.2)
Total Protein: 8.7 g/dL — ABNORMAL HIGH (ref 6.0–8.3)

## 2021-12-16 LAB — CBC WITH DIFFERENTIAL/PLATELET
Basophils Absolute: 0.1 10*3/uL (ref 0.0–0.1)
Basophils Relative: 1.5 % (ref 0.0–3.0)
Eosinophils Absolute: 0.3 10*3/uL (ref 0.0–0.7)
Eosinophils Relative: 7.1 % — ABNORMAL HIGH (ref 0.0–5.0)
HCT: 33.2 % — ABNORMAL LOW (ref 36.0–46.0)
Hemoglobin: 11.1 g/dL — ABNORMAL LOW (ref 12.0–15.0)
Lymphocytes Relative: 16.9 % (ref 12.0–46.0)
Lymphs Abs: 0.7 10*3/uL (ref 0.7–4.0)
MCHC: 33.5 g/dL (ref 30.0–36.0)
MCV: 88.6 fl (ref 78.0–100.0)
Monocytes Absolute: 0.5 10*3/uL (ref 0.1–1.0)
Monocytes Relative: 10.8 % (ref 3.0–12.0)
Neutro Abs: 2.8 10*3/uL (ref 1.4–7.7)
Neutrophils Relative %: 63.7 % (ref 43.0–77.0)
Platelets: 170 10*3/uL (ref 150.0–400.0)
RBC: 3.75 Mil/uL — ABNORMAL LOW (ref 3.87–5.11)
RDW: 15.6 % — ABNORMAL HIGH (ref 11.5–15.5)
WBC: 4.4 10*3/uL (ref 4.0–10.5)

## 2021-12-16 LAB — POCT INR: INR: 3.6 — AB (ref 2.0–3.0)

## 2021-12-16 NOTE — Patient Instructions (Signed)
You have been scheduled for an abdominal ultrasound at [facility] Radiology (1st floor of hospital) on 12/23/2021 at 10:00am. Please arrive 15 minutes prior to your appointment for registration. Make certain not to have anything to eat or drink 6 hours prior to your appointment. Should you need to reschedule your appointment, please contact radiology at [phone]. This test typically takes about 30 minutes to perform.   Your provider has requested that you go to the basement level for lab work before leaving today. Press "B" on the elevator. The lab is located at the first door on the left as you exit the elevator.  Due to recent changes in healthcare laws, you may see the results of your imaging and laboratory studies on MyChart before your provider has had a chance to review them.  We understand that in some cases there may be results that are confusing or concerning to you. Not all laboratory results come back in the same time frame and the provider may be waiting for multiple results in order to interpret others.  Please give [place] 48 hours in order for your provider to thoroughly review all the results before contacting the office for clarification of your results.    I appreciate the opportunity to care for you. Alonza Bogus, PA-C

## 2021-12-16 NOTE — Patient Instructions (Signed)
Description   Today take 1/2 tablet then continue taking 1 tablet daily EXCEPT 1/2 tablet on Sundays, Tuesdays, and Thursdays. Recheck INR 3 weeks.   Please call with any questions.   [facility] [phone]

## 2021-12-16 NOTE — Progress Notes (Signed)
12/16/2021 Chelsea Boyer [id number] 1945-05-27   HISTORY OF PRESENT ILLNESS:   This is a 76 year old female who is a patient of Dr. Ardell Isaacs.  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  She relocated here from [place] not long ago.  She is here for regular follow-up.  She was seen by me in September 2022 for a drop in her hemoglobin.  Hemoglobin is down to 9.5 g in August 2022 compared to 11.8 g in March 2022.  She denied any sign of GI bleeding.  She was scheduled for an endoscopy, but canceled that.  She was then seen by me again in March 2023 for a routine follow-up.  And now is here again for the same.  She still does not wish to proceed with EGD.  She knows that she will have to bridged to Lovenox if she has to stop her Coumadin and does not want to have to do that.  She continues to deny any sign of bleeding.  She had an ultrasound in March for [facility] surveillance.  That confirmed cirrhosis, but no suspicious lesions.  She had a large amount of ascites so we offered paracentesis, but she declined and still does.  Her weight is stable.  She is on torsemide 40 mg daily and was on spironolactone 25 mg daily, but her electrolytes and renal function were off in August so her PCP told her to stop the spironolactone.  She has not had any labs rechecked.  She has an appt with nephrology at the end of November.  She said that she actually feels pretty well.  No lower extremity swelling (she has tried to decrease her torsemide by half in the past but the fluid ends up creeping up and getting worse so she has been on 40 mg consistently now for a while).  No abdominal pain.  No confusion.  EGD 04/11/2018 by Dr. 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  Recommended avoiding NSAIDs, less than 2 g sodium diet, and repeat EGD in 1 to 2 years    Past Medical History:  Diagnosis Date   Atrial fibrillation (HCC)    Heart disease    aortic and mitral valve problems   Hepatic cirrhosis (HCC)    Hypothyroidism    Past Surgical History:  Procedure Laterality Date   ANKLE SURGERY  1998   AORTIC VALVE REPLACEMENT  2018   CESAREAN SECTION     MITRAL VALVE REPLACEMENT     x 2   TOTAL HIP ARTHROPLASTY Left 2014    reports that she has never smoked. She has never used smokeless tobacco. She reports current alcohol use. No history on file for drug use. family history includes Breast cancer in her maternal grandmother; Diabetes in her brother; Emphysema in her brother; Heart attack in her brother; Heart disease in her brother; Heart disease (age of onset: 69) in her mother; Rheumatic fever in her mother. No Known Allergies    Outpatient Encounter Medications as of 12/16/2021  Medication Sig   alendronate (FOSAMAX) 70 MG tablet Take 70 mg by mouth  once a week.   amoxicillin (AMOXIL) 500 MG capsule Take 4 tablets (2000 mg) prior to the dental visit   Cholecalciferol (VITAMIN D3) 50 MCG (2000 UT) TABS Take 6,000 Units by mouth daily.   levothyroxine (SYNTHROID) 150 MCG tablet Take 150 mcg by mouth daily before breakfast.   losartan (COZAAR) 50 MG tablet TAKE 1 TABLET BY MOUTH EVERY DAY   metoprolol succinate (TOPROL-XL) 50 MG 24 hr tablet TAKE 1 TABLET BY MOUTH DAILY. TAKE WITH OR IMMEDIATELY FOLLOWING A MEAL.   torsemide (DEMADEX) 20 MG tablet Take 1 tablet (20 mg total) by mouth daily. For a weight over 160 lbs, take 40 mg once daily. (Patient taking differently: Take 20 mg by mouth 2 (two) times daily. For a weight over 160 lbs, take 40 mg once daily.)   warfarin (COUMADIN) 5  MG tablet TAKE 1/2-1 TABLET BY MOUTH DAILY AS DIRECTED BY COUMADIN CLINIC   fluticasone (FLONASE) 50 MCG/ACT nasal spray Place 1 spray into both nostrils daily.   spironolactone (ALDACTONE) 25 MG tablet Take 1 tablet (25 mg total) by mouth every other day. (Patient not taking: Reported on 12/16/2021)   No facility-administered encounter medications on file as of 12/16/2021.     REVIEW OF SYSTEMS  : All other systems reviewed and negative except where noted in the History of Present Illness.   PHYSICAL EXAM: BP 124/66   Pulse 60   Ht 5' (1.524 m)   Wt 154 lb 6 oz (70 kg)   BMI 30.15 kg/m  General: Well developed white female in no acute distress Head: Normocephalic and atraumatic Eyes:  Sclerae anicteric, conjunctiva pink. Ears: Normal auditory acuity Lungs: Clear throughout to auscultation; no W/R/R. Heart: Regular rate and rhythm; no M/R/G. Abdomen: Softly distended with ascites fluid.  BS present.  Epigastric incisional hernia noted.  Non-tender. Musculoskeletal: Symmetrical with no gross deformities  Skin: No lesions on visible extremities Extremities: No edema  Neurological: Alert oriented x 4, grossly non-focal Psychological:  Alert and cooperative. 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  Will plan for labs today including CBC, CMP, and AFP.  Renal function and electrolytes were off at her PCP last month.  Is currently on torsemide 40 mg daily, but her spironolactone 25 mg daily as been discontinued/held for now.  She has an appt with nephrology in late November.  Weight is stable and she monitors that at home.  She declines paracentesis at this time.  We will plan for surveillance imaging/ultrasound again at 6 months interval, November 2023.   *Anemia, normocytic:  Last Hgb in March was improved in the 10 gram range.  She has not evidence of GI bleeding.  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  Repeat EGD was recommended in 1 to 2 years.  She was scheduled for EGD when she was seen in 11/2020 but canceled and still does not wish to proceed at this time.  Does not want to have to stop coumadin and bridge to Lovenox.   *Status post MVR, status post TAVR, A. fib.  Maintained on Coumadin.    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

## 2021-12-17 ENCOUNTER — Other Ambulatory Visit: Payer: Self-pay

## 2021-12-17 DIAGNOSIS — R188 Other ascites: Secondary | ICD-10-CM

## 2021-12-17 DIAGNOSIS — D649 Anemia, unspecified: Secondary | ICD-10-CM

## 2021-12-17 LAB — AFP TUMOR MARKER: AFP-Tumor Marker: 2 ng/mL

## 2021-12-23 ENCOUNTER — Ambulatory Visit (HOSPITAL_COMMUNITY)
Admission: RE | Admit: 2021-12-23 | Discharge: 2021-12-23 | Disposition: A | Discharge status: Home / Self Care | Payer: Medicare Other | Source: Ambulatory Visit | Attending: Gastroenterology | Admitting: Gastroenterology

## 2021-12-23 DIAGNOSIS — K7460 Unspecified cirrhosis of liver: Secondary | ICD-10-CM | POA: Diagnosis present

## 2021-12-23 DIAGNOSIS — R188 Other ascites: Secondary | ICD-10-CM | POA: Insufficient documentation

## 2021-12-29 ENCOUNTER — Other Ambulatory Visit (INDEPENDENT_AMBULATORY_CARE_PROVIDER_SITE_OTHER): Payer: Medicare Other

## 2021-12-29 DIAGNOSIS — R188 Other ascites: Secondary | ICD-10-CM

## 2021-12-29 DIAGNOSIS — K7460 Unspecified cirrhosis of liver: Secondary | ICD-10-CM | POA: Diagnosis not present

## 2021-12-29 DIAGNOSIS — D649 Anemia, unspecified: Secondary | ICD-10-CM

## 2021-12-29 LAB — BASIC METABOLIC PANEL
BUN: 56 mg/dL — ABNORMAL HIGH (ref 6–23)
CO2: 28 mEq/L (ref 19–32)
Calcium: 9.3 mg/dL (ref 8.4–10.5)
Chloride: 97 mEq/L (ref 96–112)
Creatinine, Ser: 1.47 mg/dL — ABNORMAL HIGH (ref 0.40–1.20)
GFR: 34.32 mL/min — ABNORMAL LOW (ref >60.00)
Glucose, Bld: 94 mg/dL (ref 70–99)
Potassium: 4.3 mEq/L (ref 3.5–5.1)
Sodium: 132 mEq/L — ABNORMAL LOW (ref 135–145)

## 2021-12-30 ENCOUNTER — Other Ambulatory Visit: Payer: Self-pay

## 2021-12-30 DIAGNOSIS — K7460 Unspecified cirrhosis of liver: Secondary | ICD-10-CM

## 2022-01-05 ENCOUNTER — Other Ambulatory Visit: Payer: Self-pay | Admitting: Cardiovascular Disease

## 2022-01-05 DIAGNOSIS — I824Y9 Acute embolism and thrombosis of unspecified deep veins of unspecified proximal lower extremity: Secondary | ICD-10-CM

## 2022-01-05 DIAGNOSIS — I4891 Unspecified atrial fibrillation: Secondary | ICD-10-CM

## 2022-01-05 DIAGNOSIS — Z952 Presence of prosthetic heart valve: Secondary | ICD-10-CM

## 2022-01-05 DIAGNOSIS — Z7901 Long term (current) use of anticoagulants: Secondary | ICD-10-CM

## 2022-01-08 ENCOUNTER — Ambulatory Visit: Payer: Medicare Other | Attending: Cardiovascular Disease | Admitting: *Deleted

## 2022-01-08 DIAGNOSIS — I4821 Permanent atrial fibrillation: Secondary | ICD-10-CM | POA: Diagnosis not present

## 2022-01-08 DIAGNOSIS — Z7901 Long term (current) use of anticoagulants: Secondary | ICD-10-CM

## 2022-01-08 DIAGNOSIS — I824Y9 Acute embolism and thrombosis of unspecified deep veins of unspecified proximal lower extremity: Secondary | ICD-10-CM

## 2022-01-08 DIAGNOSIS — Z952 Presence of prosthetic heart valve: Secondary | ICD-10-CM | POA: Diagnosis not present

## 2022-01-08 LAB — POCT INR: INR: 3.1 — AB (ref 2.0–3.0)

## 2022-01-08 NOTE — Patient Instructions (Signed)
Description   Continue taking 1 tablet daily EXCEPT 1/2 tablet on Sundays, Tuesdays, and Thursdays. Recheck INR 4 weeks.   Please call with any questions.   [facility] [phone]

## 2022-01-14 ENCOUNTER — Other Ambulatory Visit (INDEPENDENT_AMBULATORY_CARE_PROVIDER_SITE_OTHER): Payer: Medicare Other

## 2022-01-14 DIAGNOSIS — K7460 Unspecified cirrhosis of liver: Secondary | ICD-10-CM | POA: Diagnosis not present

## 2022-01-14 DIAGNOSIS — R188 Other ascites: Secondary | ICD-10-CM | POA: Diagnosis not present

## 2022-01-14 LAB — BASIC METABOLIC PANEL
BUN: 46 mg/dL — ABNORMAL HIGH (ref 6–23)
CO2: 27 mEq/L (ref 19–32)
Calcium: 9.1 mg/dL (ref 8.4–10.5)
Chloride: 98 mEq/L (ref 96–112)
Creatinine, Ser: 1.46 mg/dL — ABNORMAL HIGH (ref 0.40–1.20)
GFR: 34.59 mL/min — ABNORMAL LOW (ref >60.00)
Glucose, Bld: 73 mg/dL (ref 70–99)
Potassium: 5.4 mEq/L — ABNORMAL HIGH (ref 3.5–5.1)
Sodium: 130 mEq/L — ABNORMAL LOW (ref 135–145)

## 2022-01-15 ENCOUNTER — Other Ambulatory Visit: Payer: Self-pay

## 2022-01-15 DIAGNOSIS — K7460 Unspecified cirrhosis of liver: Secondary | ICD-10-CM

## 2022-01-18 ENCOUNTER — Other Ambulatory Visit (INDEPENDENT_AMBULATORY_CARE_PROVIDER_SITE_OTHER): Payer: Medicare Other

## 2022-01-18 DIAGNOSIS — K7460 Unspecified cirrhosis of liver: Secondary | ICD-10-CM | POA: Diagnosis not present

## 2022-01-18 DIAGNOSIS — R188 Other ascites: Secondary | ICD-10-CM | POA: Diagnosis not present

## 2022-01-18 LAB — BASIC METABOLIC PANEL
BUN: 50 mg/dL — ABNORMAL HIGH (ref 6–23)
CO2: 27 mEq/L (ref 19–32)
Calcium: 9.3 mg/dL (ref 8.4–10.5)
Chloride: 96 mEq/L (ref 96–112)
Creatinine, Ser: 1.3 mg/dL — ABNORMAL HIGH (ref 0.40–1.20)
GFR: 39.75 mL/min — ABNORMAL LOW (ref >60.00)
Glucose, Bld: 84 mg/dL (ref 70–99)
Potassium: 4.4 mEq/L (ref 3.5–5.1)
Sodium: 130 mEq/L — ABNORMAL LOW (ref 135–145)

## 2022-01-26 ENCOUNTER — Other Ambulatory Visit: Payer: Self-pay | Admitting: Cardiovascular Disease

## 2022-02-05 ENCOUNTER — Ambulatory Visit: Payer: Medicare Other | Attending: Cardiology

## 2022-02-05 DIAGNOSIS — Z952 Presence of prosthetic heart valve: Secondary | ICD-10-CM

## 2022-02-05 DIAGNOSIS — Z7901 Long term (current) use of anticoagulants: Secondary | ICD-10-CM

## 2022-02-05 LAB — POCT INR: INR: 3.6 — AB (ref 2.0–3.0)

## 2022-02-05 NOTE — Patient Instructions (Signed)
Continue taking 1 tablet daily EXCEPT 1/2 tablet on Sundays, Tuesdays, and Thursdays. Recheck INR 4 weeks.   Please call with any questions.   [facility] [phone]

## 2022-02-13 ENCOUNTER — Other Ambulatory Visit: Payer: Self-pay | Admitting: Cardiovascular Disease

## 2022-02-13 DIAGNOSIS — Z952 Presence of prosthetic heart valve: Secondary | ICD-10-CM

## 2022-02-13 DIAGNOSIS — Z7901 Long term (current) use of anticoagulants: Secondary | ICD-10-CM

## 2022-02-13 DIAGNOSIS — I4891 Unspecified atrial fibrillation: Secondary | ICD-10-CM

## 2022-02-13 DIAGNOSIS — I824Y9 Acute embolism and thrombosis of unspecified deep veins of unspecified proximal lower extremity: Secondary | ICD-10-CM

## 2022-03-05 ENCOUNTER — Ambulatory Visit: Payer: Medicare Other | Attending: Cardiovascular Disease

## 2022-03-05 DIAGNOSIS — Z952 Presence of prosthetic heart valve: Secondary | ICD-10-CM

## 2022-03-05 DIAGNOSIS — Z5181 Encounter for therapeutic drug level monitoring: Secondary | ICD-10-CM

## 2022-03-05 DIAGNOSIS — Z7901 Long term (current) use of anticoagulants: Secondary | ICD-10-CM

## 2022-03-05 LAB — POCT INR: INR: 4.2 — AB (ref 2.0–3.0)

## 2022-03-05 NOTE — Patient Instructions (Signed)
DECREASE TO 0.5 TABLET DAILY EXCEPT 1 TABLET MONDAY, WEDNESDAY AND FRIDAY.  INR in 3 weeks Please call with any questions.   [facility] Clinic [phone]

## 2022-03-19 ENCOUNTER — Other Ambulatory Visit: Payer: Self-pay | Admitting: Cardiovascular Disease

## 2022-03-26 ENCOUNTER — Ambulatory Visit: Payer: Medicare Other | Attending: Cardiology | Admitting: *Deleted

## 2022-03-26 DIAGNOSIS — Z7901 Long term (current) use of anticoagulants: Secondary | ICD-10-CM

## 2022-03-26 DIAGNOSIS — Z952 Presence of prosthetic heart valve: Secondary | ICD-10-CM | POA: Diagnosis not present

## 2022-03-26 DIAGNOSIS — I4821 Permanent atrial fibrillation: Secondary | ICD-10-CM | POA: Diagnosis not present

## 2022-03-26 DIAGNOSIS — I824Y9 Acute embolism and thrombosis of unspecified deep veins of unspecified proximal lower extremity: Secondary | ICD-10-CM

## 2022-03-26 LAB — POCT INR: INR: 3.5 — AB (ref 2.0–3.0)

## 2022-03-26 NOTE — Patient Instructions (Signed)
Description   CONTINUE TAKING WARFARIN 0.5 TABLET DAILY EXCEPT 1 TABLET MONDAY, WEDNESDAY AND FRIDAY.  Add another leafy vegetable in your diet & stay consistent. Recheck INR in 4 weeks. Please call with any questions. [facility]in Clinic [phone]

## 2022-04-23 ENCOUNTER — Ambulatory Visit: Payer: Medicare Other | Attending: Cardiovascular Disease | Admitting: *Deleted

## 2022-04-23 DIAGNOSIS — I824Y9 Acute embolism and thrombosis of unspecified deep veins of unspecified proximal lower extremity: Secondary | ICD-10-CM | POA: Diagnosis present

## 2022-04-23 DIAGNOSIS — I4821 Permanent atrial fibrillation: Secondary | ICD-10-CM | POA: Diagnosis not present

## 2022-04-23 DIAGNOSIS — Z7901 Long term (current) use of anticoagulants: Secondary | ICD-10-CM | POA: Diagnosis not present

## 2022-04-23 DIAGNOSIS — Z952 Presence of prosthetic heart valve: Secondary | ICD-10-CM | POA: Insufficient documentation

## 2022-04-23 LAB — POCT INR: INR: 3.4 — AB (ref 2.0–3.0)

## 2022-04-23 NOTE — Patient Instructions (Signed)
Description   CONTINUE TAKING WARFARIN 0.5 TABLET DAILY EXCEPT 1 TABLET MONDAY, WEDNESDAY AND FRIDAY. Add another leafy vegetable in your diet & stay consistent. Recheck INR in 5 weeks. Please call with any questions. Coumadin Clinic [phone]

## 2022-04-28 ENCOUNTER — Other Ambulatory Visit: Payer: Self-pay | Admitting: Family Medicine

## 2022-04-28 DIAGNOSIS — E2839 Other primary ovarian failure: Secondary | ICD-10-CM

## 2022-05-28 ENCOUNTER — Ambulatory Visit: Payer: Medicare Other | Attending: Cardiovascular Disease

## 2022-05-28 DIAGNOSIS — Z7901 Long term (current) use of anticoagulants: Secondary | ICD-10-CM | POA: Insufficient documentation

## 2022-05-28 DIAGNOSIS — I4821 Permanent atrial fibrillation: Secondary | ICD-10-CM | POA: Diagnosis present

## 2022-05-28 DIAGNOSIS — I824Y9 Acute embolism and thrombosis of unspecified deep veins of unspecified proximal lower extremity: Secondary | ICD-10-CM | POA: Insufficient documentation

## 2022-05-28 DIAGNOSIS — Z952 Presence of prosthetic heart valve: Secondary | ICD-10-CM | POA: Insufficient documentation

## 2022-05-28 LAB — POCT INR: INR: 3.2 — AB (ref 2.0–3.0)

## 2022-05-28 NOTE — Patient Instructions (Signed)
Description   CONTINUE TAKING WARFARIN 0.5 TABLET DAILY EXCEPT 1 TABLET MONDAY, WEDNESDAY AND FRIDAY. Continue leafy vegetables in your diet & stay consistent. Recheck INR in 6 weeks. Please call with any questions. [facility] Clinic [phone]

## 2022-06-01 ENCOUNTER — Other Ambulatory Visit: Payer: Self-pay | Admitting: Cardiovascular Disease

## 2022-06-01 DIAGNOSIS — Z7901 Long term (current) use of anticoagulants: Secondary | ICD-10-CM

## 2022-06-01 DIAGNOSIS — I824Y9 Acute embolism and thrombosis of unspecified deep veins of unspecified proximal lower extremity: Secondary | ICD-10-CM

## 2022-06-01 DIAGNOSIS — I4891 Unspecified atrial fibrillation: Secondary | ICD-10-CM

## 2022-06-01 DIAGNOSIS — Z952 Presence of prosthetic heart valve: Secondary | ICD-10-CM

## 2022-06-09 ENCOUNTER — Other Ambulatory Visit (INDEPENDENT_AMBULATORY_CARE_PROVIDER_SITE_OTHER): Payer: Medicare Other

## 2022-06-09 ENCOUNTER — Encounter: Payer: Self-pay | Admitting: Gastroenterology

## 2022-06-09 ENCOUNTER — Ambulatory Visit (INDEPENDENT_AMBULATORY_CARE_PROVIDER_SITE_OTHER): Payer: Medicare Other | Admitting: Gastroenterology

## 2022-06-09 VITALS — BP 120/60 | HR 81 | Ht 60.0 in | Wt 145.0 lb

## 2022-06-09 DIAGNOSIS — D649 Anemia, unspecified: Secondary | ICD-10-CM

## 2022-06-09 DIAGNOSIS — K7460 Unspecified cirrhosis of liver: Secondary | ICD-10-CM | POA: Diagnosis not present

## 2022-06-09 DIAGNOSIS — R188 Other ascites: Secondary | ICD-10-CM

## 2022-06-09 DIAGNOSIS — Z7901 Long term (current) use of anticoagulants: Secondary | ICD-10-CM

## 2022-06-09 LAB — CBC WITH DIFFERENTIAL/PLATELET
Basophils Absolute: 0.1 10*3/uL (ref 0.0–0.1)
Basophils Relative: 1.8 % (ref 0.0–3.0)
Eosinophils Absolute: 0.3 10*3/uL (ref 0.0–0.7)
Eosinophils Relative: 6.2 % — ABNORMAL HIGH (ref 0.0–5.0)
HCT: 34.2 % — ABNORMAL LOW (ref 36.0–46.0)
Hemoglobin: 11.2 g/dL — ABNORMAL LOW (ref 12.0–15.0)
Lymphocytes Relative: 12.3 % (ref 12.0–46.0)
Lymphs Abs: 0.5 10*3/uL — ABNORMAL LOW (ref 0.7–4.0)
MCHC: 32.8 g/dL (ref 30.0–36.0)
MCV: 86.4 fl (ref 78.0–100.0)
Monocytes Absolute: 0.4 10*3/uL (ref 0.1–1.0)
Monocytes Relative: 10.4 % (ref 3.0–12.0)
Neutro Abs: 2.8 10*3/uL (ref 1.4–7.7)
Neutrophils Relative %: 69.3 % (ref 43.0–77.0)
Platelets: 168 10*3/uL (ref 150.0–400.0)
RBC: 3.96 Mil/uL (ref 3.87–5.11)
RDW: 16 % — ABNORMAL HIGH (ref 11.5–15.5)
WBC: 4.1 10*3/uL (ref 4.0–10.5)

## 2022-06-09 LAB — COMPREHENSIVE METABOLIC PANEL
ALT: 11 U/L (ref 0–35)
AST: 27 U/L (ref 0–37)
Albumin: 3.5 g/dL (ref 3.5–5.2)
Alkaline Phosphatase: 77 U/L (ref 39–117)
BUN: 54 mg/dL — ABNORMAL HIGH (ref 6–23)
CO2: 28 mEq/L (ref 19–32)
Calcium: 8.9 mg/dL (ref 8.4–10.5)
Chloride: 91 mEq/L — ABNORMAL LOW (ref 96–112)
Creatinine, Ser: 1.33 mg/dL — ABNORMAL HIGH (ref 0.40–1.20)
GFR: 38.58 mL/min — ABNORMAL LOW (ref >60.00)
Glucose, Bld: 114 mg/dL — ABNORMAL HIGH (ref 70–99)
Potassium: 4.3 mEq/L (ref 3.5–5.1)
Sodium: 124 mEq/L — ABNORMAL LOW (ref 135–145)
Total Bilirubin: 1 mg/dL (ref 0.2–1.2)
Total Protein: 8.3 g/dL (ref 6.0–8.3)

## 2022-06-09 NOTE — Patient Instructions (Signed)
You have been scheduled for an abdominal ultrasound at [facility] Radiology (1st floor of hospital) on 06/25/2022 at 10 am. Please arrive 30 minutes prior to your appointment for registration. Make certain not to have anything to eat or drink 8 hours prior to your appointment. Should you need to reschedule your appointment, please contact radiology at [phone]. This test typically takes about 30 minutes to perform.   Your provider has requested that you go to the basement level for lab work before leaving today. Press "B" on the elevator. The lab is located at the first door on the left as you exit the elevator.   Due to recent changes in healthcare laws, you may see the results of your imaging and laboratory studies on MyChart before your provider has had a chance to review them.  We understand that in some cases there may be results that are confusing or concerning to you. Not all laboratory results come back in the same time frame and the provider may be waiting for multiple results in order to interpret others.  Please give [place] 48 hours in order for your provider to thoroughly review all the results before contacting the office for clarification of your results.    _______________________________________________________  If your blood pressure at your visit was 140/90 or greater, please contact your primary care physician to follow up on this.  _______________________________________________________  If you are age 31 or older, your body mass index should be between 23-30. Your Body mass index is 28.32 kg/m. If this is out of the aforementioned range listed, please consider follow up with your Primary Care Provider.  If you are age 37 or younger, your body mass index should be between 19-25. Your Body mass index is 28.32 kg/m. If this is out of the aformentioned range listed, please consider follow up with your Primary Care Provider.    ________________________________________________________  The [place] [facility] providers would like to encourage you to use [facility] to communicate with providers for non-urgent requests or questions.  Due to long hold times on the telephone, sending your provider a message by [facility] may be a faster and more efficient way to get a response.  Please allow 48 business hours for a response.  Please remember that this is for non-urgent requests.  _______________________________________________________   I appreciate the  opportunity to care for you  Thank You   Janett Billow Zehr,PA-C

## 2022-06-09 NOTE — Progress Notes (Signed)
06/09/2022 Chelsea Boyer [id number] 23-Jun-1945   HISTORY OF PRESENT ILLNESS:  This is a 77 year old female who is a patient of Dr. Lynne Leader.  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  She relocated here from [place].  She is here for regular follow-up.  She says that she feels well.  She denied any sign of GI bleeding.  No abdominal pain.  Has seen nephrology on a couple of occasions and they are regulating her diuretics.  Is on torsemide 40 mg daily and Aldactone 50 mg daily.  Her weight is down 9 pounds compared to her last visit here.  No confusion.  No new concerns.  Has declined EGD as she does not want to undergo procedures and does not want to have to stop her Coumadin, use Lovenox, etc.  Ultrasound 12/2022:  IMPRESSION: 1. Cirrhotic liver identified.  No liver mass noted. 2. Ascites. 3. A stone is identified in the gallbladder. The gallbladder wall remains thickened to 4 mm, stable. The wall thickening could be due to the underlying cirrhosis.  EGD 04/11/2018 by Dr. 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  Recommended avoiding NSAIDs, less than 2 g sodium diet, and repeat EGD in 1 to 2 years   Past Medical History:  Diagnosis Date   Atrial fibrillation ([place])    Heart disease    aortic and mitral valve problems   Hepatic cirrhosis ([place])    Hypothyroidism    Past Surgical History:  Procedure Laterality Date   ANKLE SURGERY  1998   AORTIC VALVE REPLACEMENT  2018   CESAREAN SECTION     MITRAL VALVE REPLACEMENT     x 2   TOTAL HIP  ARTHROPLASTY Left 2014    reports that she has never smoked. She has never used smokeless tobacco. She reports current alcohol use. No history on file for drug use. family history includes Breast cancer in her maternal grandmother; Diabetes in her brother; Emphysema in her brother; Heart attack in her brother; Heart disease in her brother; Heart disease (age of onset: 49) in her mother; Rheumatic fever in her mother. No Known Allergies    Outpatient Encounter Medications as of 06/09/2022  Medication Sig   alendronate (FOSAMAX) 70 MG tablet Take 70 mg by mouth once a week.   amoxicillin (AMOXIL) 500 MG capsule TAKE 4 TABLETS (2000 MG) PRIOR TO THE DENTAL VISIT   Cholecalciferol (VITAMIN D3) 50 MCG (2000 UT) TABS Take 6,000 Units by mouth daily.   fluticasone (FLONASE) 50 MCG/ACT nasal spray Place 1 spray into both nostrils daily.   levothyroxine (SYNTHROID) 150 MCG tablet Take 150 mcg by mouth daily before breakfast.   metoprolol succinate (TOPROL-XL) 50 MG 24 hr tablet TAKE 1 TABLET BY MOUTH EVERY DAY WITH OR IMMEDIATELY FOLLOWING A MEAL   spironolactone (ALDACTONE) 25 MG tablet Take 1 tablet (25 mg total) by mouth every other day. (Patient taking differently: Take 50 mg by mouth daily.)   torsemide (DEMADEX) 20 MG tablet TAKE 1 TABLET (20 MG TOTAL) BY MOUTH DAILY. FOR A WEIGHT OVER 160 LBS, TAKE 40 MG  ONCE DAILY.   warfarin (COUMADIN) 5 MG tablet TAKE 1/2 TO 1 TABLET BY MOUTH DAILY AS DIRECTED BY COUMADIN CLINIC   [DISCONTINUED] losartan (COZAAR) 50 MG tablet TAKE 1 TABLET BY MOUTH EVERY DAY   No facility-administered encounter medications on file as of 06/09/2022.     REVIEW OF SYSTEMS  : All other systems reviewed and negative except where noted in the History of Present Illness.   PHYSICAL EXAM: BP 120/60   Pulse 81   Ht 5' (1.524 m)   Wt 145 lb (65.8 kg)   BMI 28.32 kg/m  General: Well developed white female in no acute distress Head: Normocephalic and atraumatic Eyes:  Sclerae  anicteric, conjunctiva pink. Ears: Normal auditory acuity Lungs: Clear throughout to auscultation; no W/R/R. Heart: Regular rate and rhythm; no M/R/G. Abdomen: Softly distended with ascites fluid.  BS present.  Non-tender. Musculoskeletal: Symmetrical with no gross deformities  Skin: No lesions on visible extremities Extremities:  Minimal edema in B/L LEs Neurological: Alert oriented x 4, grossly non-focal Psychological:  Alert and cooperative. 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  Will plan for labs today including CBC, CMP, and AFP.  Is currently on torsemide 40 mg daily, and spironolactone 50 mg daily.  She is following with nephrology.  Weight is stable (actually down 9 pounds) and she monitors that at home.  We will plan for surveillance imaging/ultrasound again next month.   *Anemia, normocytic:  She has not evidence of GI bleeding.  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  Repeat EGD was recommended in 1 to 2 years.  She was scheduled for EGD when she was seen in 11/2020 but canceled and still does not wish to proceed at this time.  Does not want to have to stop coumadin and bridge to Lovenox.   *Status post MVR, status post TAVR, A. fib.  Maintained on Coumadin.    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  -Follow-up in 6 months or sooner if needed.   CC:  Glenis Smoker, *

## 2022-06-11 LAB — AFP TUMOR MARKER: AFP-Tumor Marker: 2.2 ng/mL

## 2022-06-25 ENCOUNTER — Ambulatory Visit (HOSPITAL_COMMUNITY)
Admission: RE | Admit: 2022-06-25 | Discharge: 2022-06-25 | Disposition: A | Discharge status: Home / Self Care | Payer: Medicare Other | Source: Ambulatory Visit | Attending: Gastroenterology | Admitting: Gastroenterology

## 2022-06-25 DIAGNOSIS — R188 Other ascites: Secondary | ICD-10-CM | POA: Diagnosis not present

## 2022-06-25 DIAGNOSIS — Z7901 Long term (current) use of anticoagulants: Secondary | ICD-10-CM | POA: Diagnosis present

## 2022-06-25 DIAGNOSIS — D649 Anemia, unspecified: Secondary | ICD-10-CM | POA: Diagnosis present

## 2022-06-25 DIAGNOSIS — K7460 Unspecified cirrhosis of liver: Secondary | ICD-10-CM

## 2022-07-09 ENCOUNTER — Ambulatory Visit: Payer: Medicare Other | Attending: Cardiovascular Disease

## 2022-07-09 DIAGNOSIS — I824Y9 Acute embolism and thrombosis of unspecified deep veins of unspecified proximal lower extremity: Secondary | ICD-10-CM

## 2022-07-09 DIAGNOSIS — Z7901 Long term (current) use of anticoagulants: Secondary | ICD-10-CM

## 2022-07-09 DIAGNOSIS — Z952 Presence of prosthetic heart valve: Secondary | ICD-10-CM

## 2022-07-09 DIAGNOSIS — I4821 Permanent atrial fibrillation: Secondary | ICD-10-CM | POA: Insufficient documentation

## 2022-07-09 LAB — POCT INR: INR: 2.5 (ref 2.0–3.0)

## 2022-07-09 NOTE — Patient Instructions (Signed)
Description   CONTINUE TAKING WARFARIN 0.5 TABLET DAILY EXCEPT 1 TABLET MONDAY, WEDNESDAY AND FRIDAY. Continue leafy vegetables in your diet & stay consistent. Recheck INR in 6 weeks. Please call with any questions. Coumadin Clinic [phone]     

## 2022-07-19 IMAGING — MG MM DIGITAL SCREENING BILAT W/ TOMO AND CAD
8 series · 8 of 24 positions shown · non-contrast
Comparison: Previous exam(s).

CLINICAL DATA: Screening.

EXAM:
DIGITAL SCREENING BILATERAL MAMMOGRAM WITH TOMOSYNTHESIS AND CAD
TECHNIQUE: Bilateral screening digital craniocaudal and mediolateral oblique
mammograms were obtained. Bilateral screening digital breast
tomosynthesis was performed. The images were evaluated with
computer-aided detection.

[L CC synth-2D]
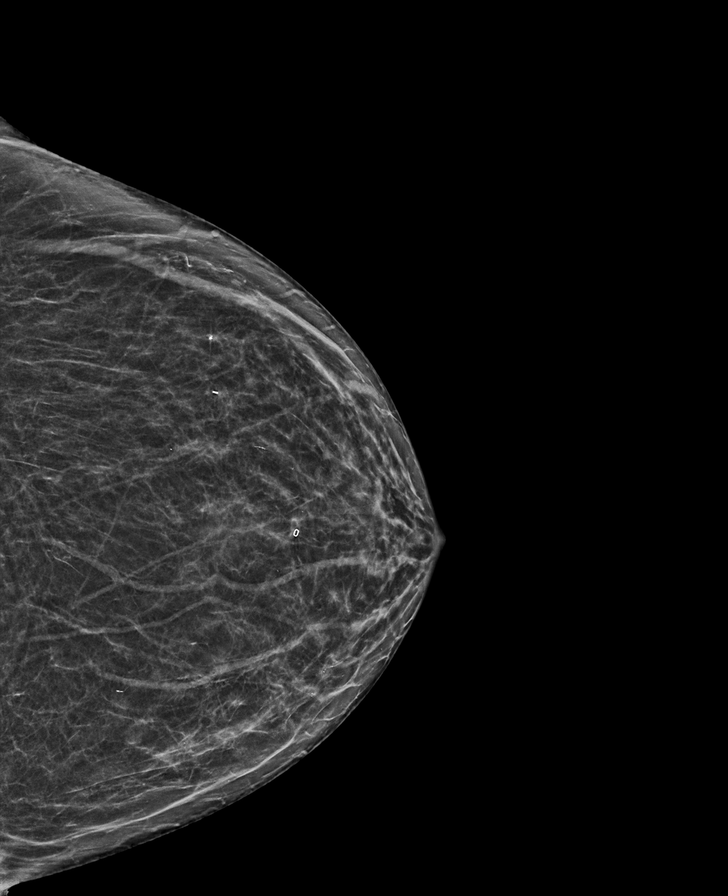

[R CC synth-2D]
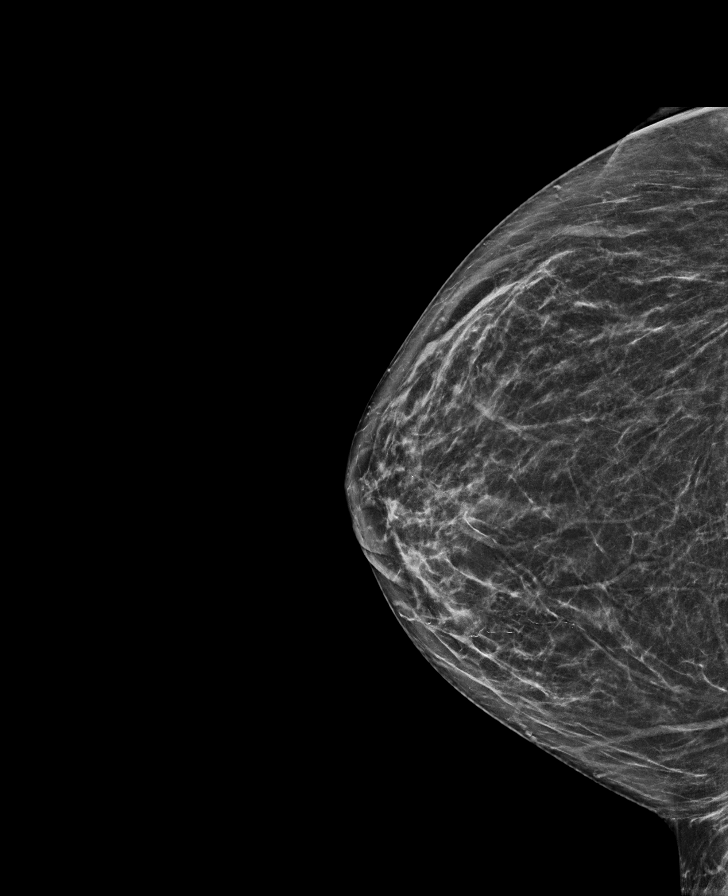

[R MLO synth-2D]
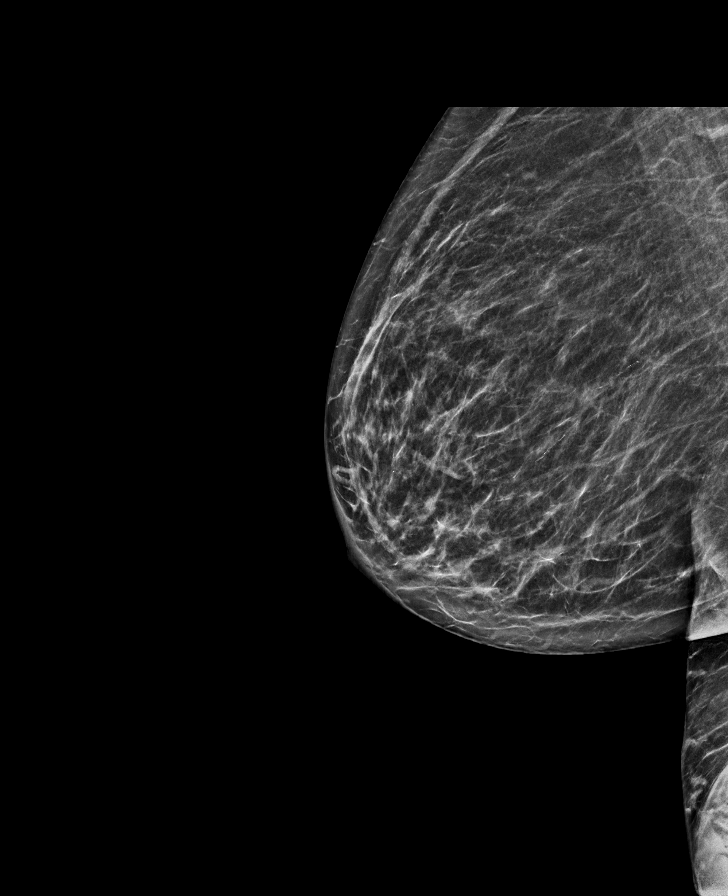

[L MLO synth-2D]
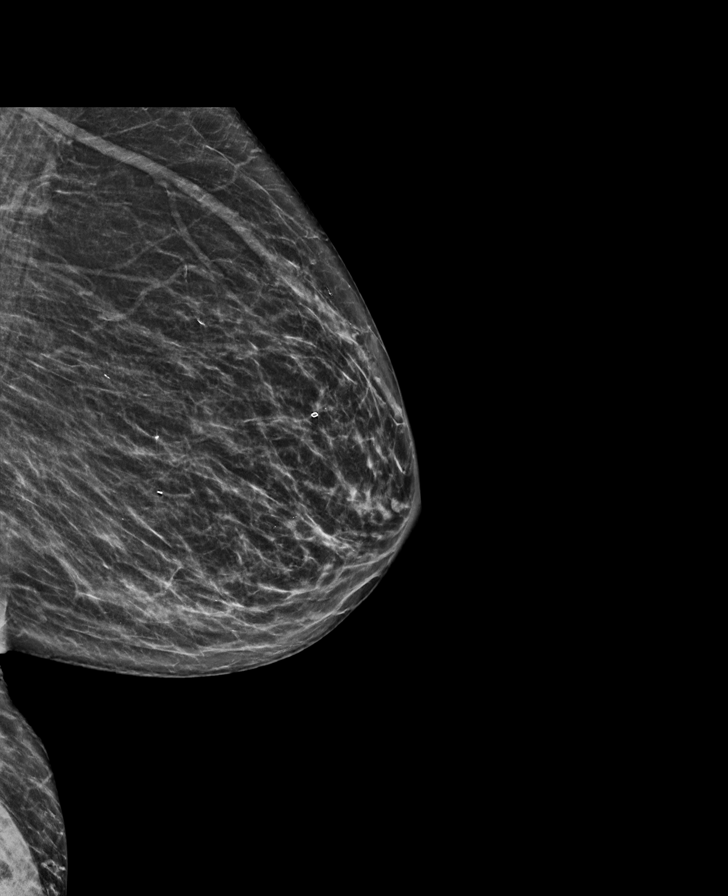

[L MLO tomo · tomo slice 33/65.0]
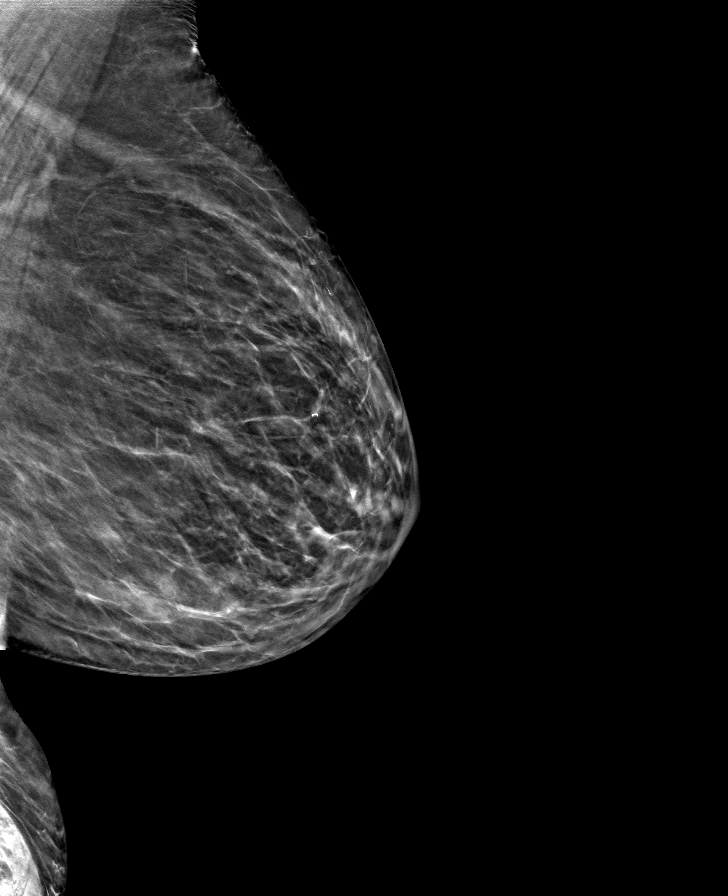

[L CC tomo · tomo slice 32/63.0]
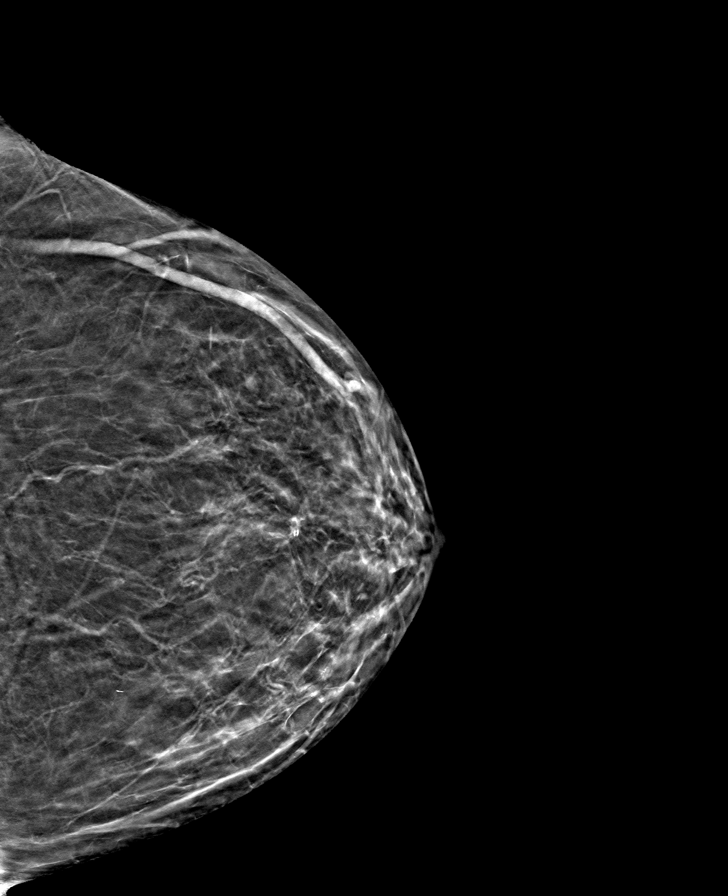

[R CC tomo · tomo slice 37/72.0]
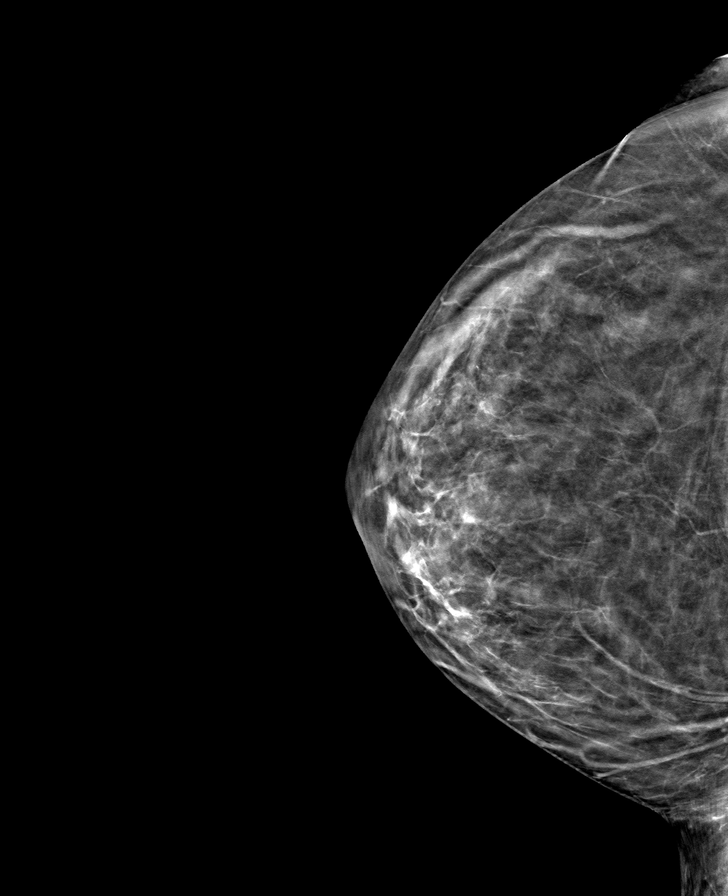

[R MLO tomo · tomo slice 33/66.0]
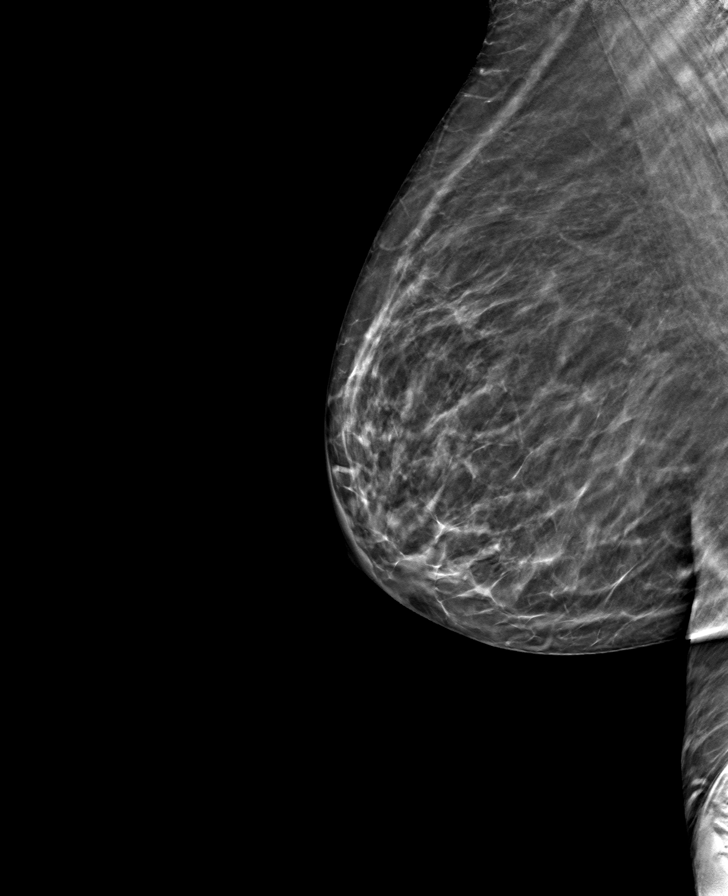

[8 of 24 positions shown; findings below may reference images not displayed]

ACR Breast Density Category b: There are scattered areas of
fibroglandular density.
FINDINGS: There are no findings suspicious for malignancy.
IMPRESSION: No mammographic evidence of malignancy. A result letter of this
screening mammogram will be mailed directly to the patient.

RECOMMENDATION:
Screening mammogram in one year. (Code:51-O-LD2)

BI-RADS CATEGORY  1: Negative.

## 2022-08-20 ENCOUNTER — Ambulatory Visit: Payer: Medicare Other | Attending: Cardiology

## 2022-08-20 DIAGNOSIS — Z952 Presence of prosthetic heart valve: Secondary | ICD-10-CM

## 2022-08-20 LAB — POCT INR: INR: 3.1 — AB (ref 2.0–3.0)

## 2022-08-20 NOTE — Patient Instructions (Signed)
Description   CONTINUE TAKING WARFARIN 0.5 TABLET DAILY EXCEPT 1 TABLET MONDAY, WEDNESDAY AND FRIDAY. Continue leafy vegetables in your diet & stay consistent. Recheck INR in 6 weeks. Please call with any questions. Coumadin Clinic [phone]     

## 2022-09-17 IMAGING — US US ABDOMEN COMPLETE
1 series · 15 of 25 positions shown · non-contrast
Comparison: Ultrasound abdomen January 27, 2021.

CLINICAL DATA: Cirrhosis.  Ascites.

EXAM:
ABDOMEN ULTRASOUND COMPLETE

[Series 1: us abdomen complete mc & wl · 15 of 81 slices shown]
[im 1/81]
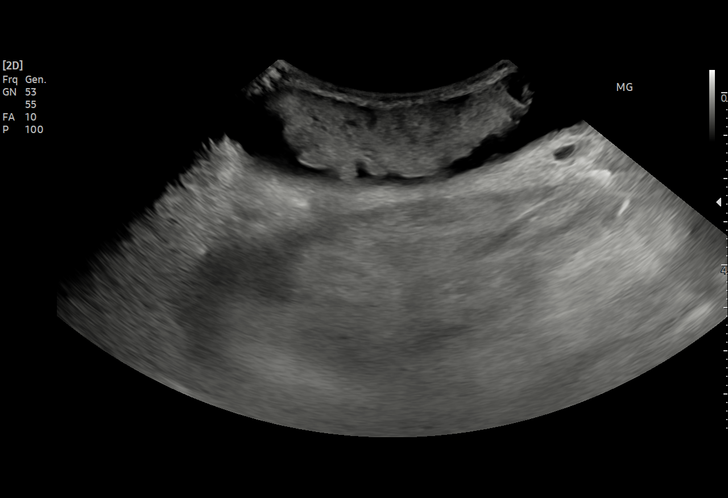
[im 7/81]
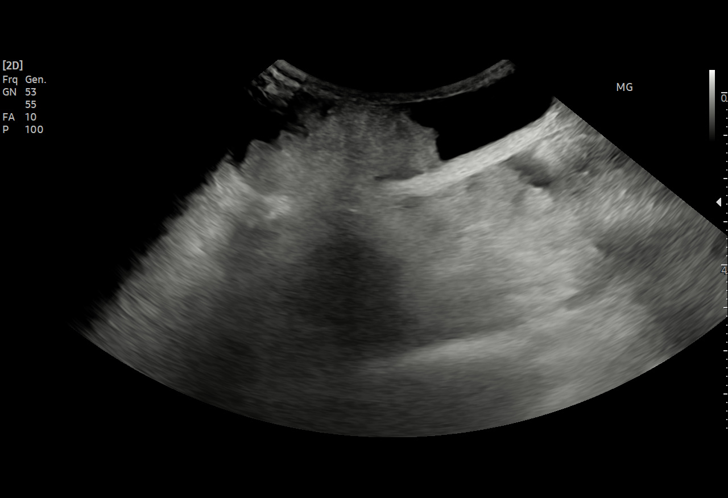
[im 14/81]
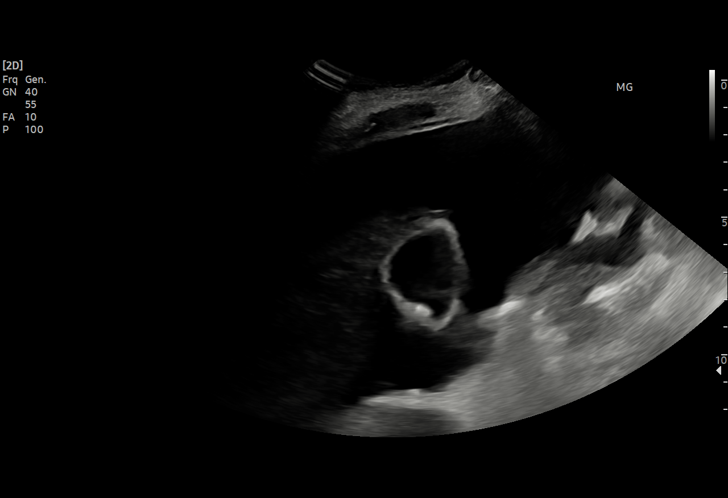
[im 17/81]
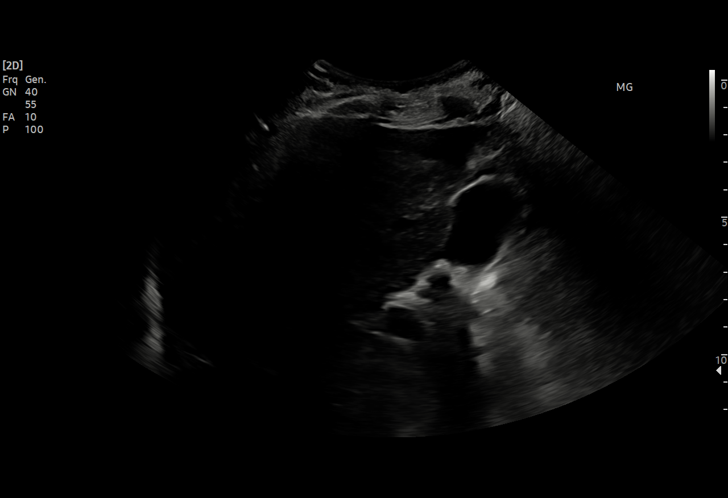
[im 24/81]
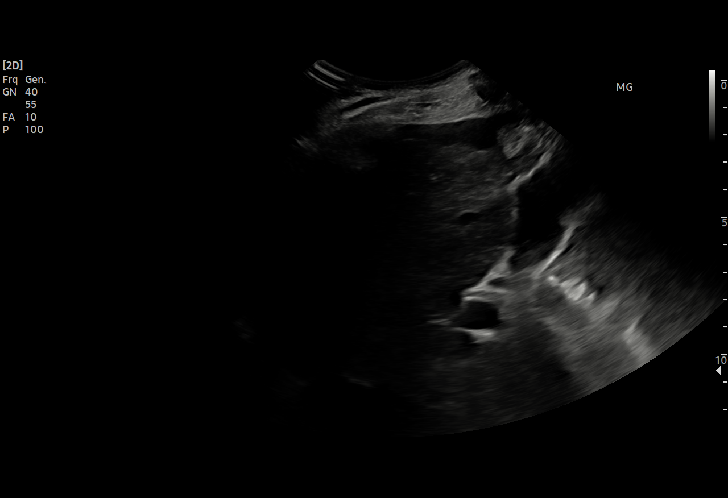
[im 31/81]
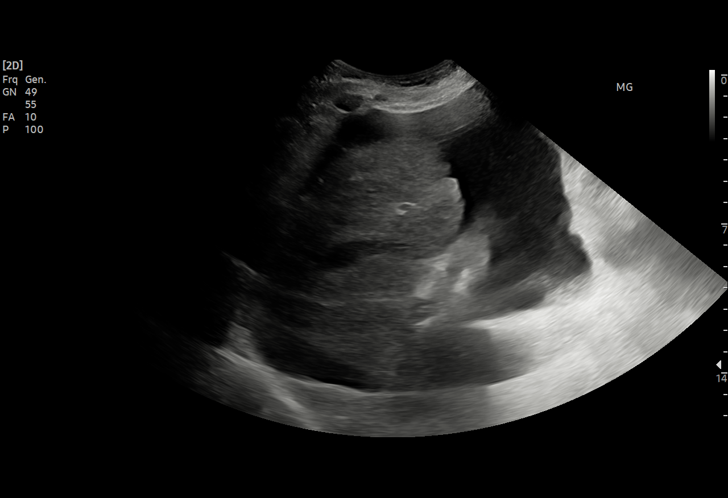
[im 34/81]
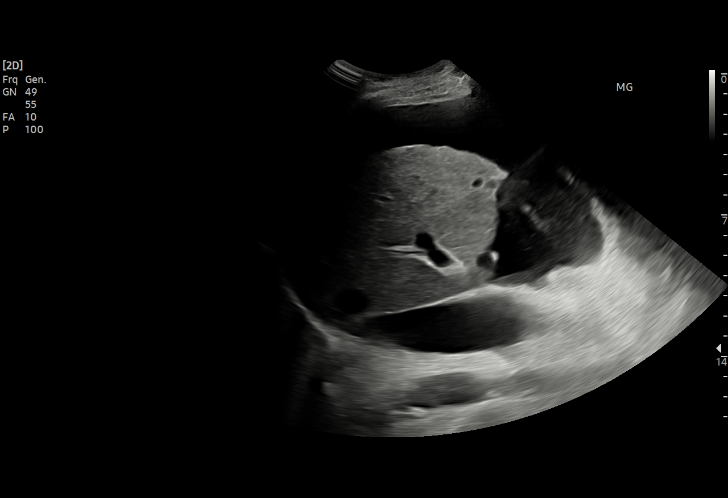
[im 41/81]
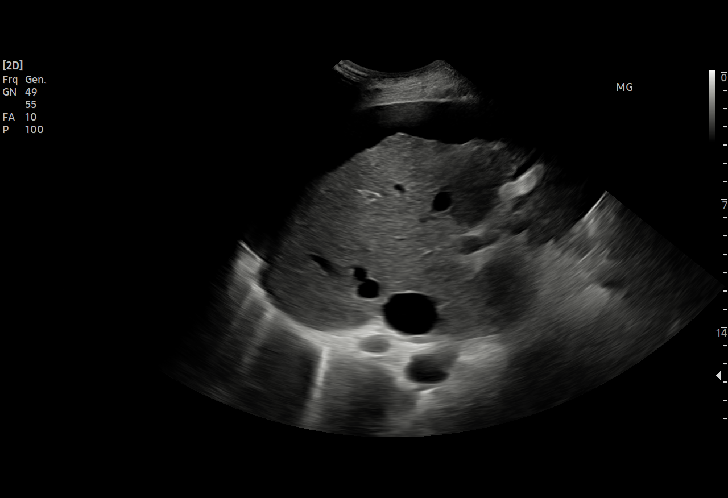
[im 47/81]
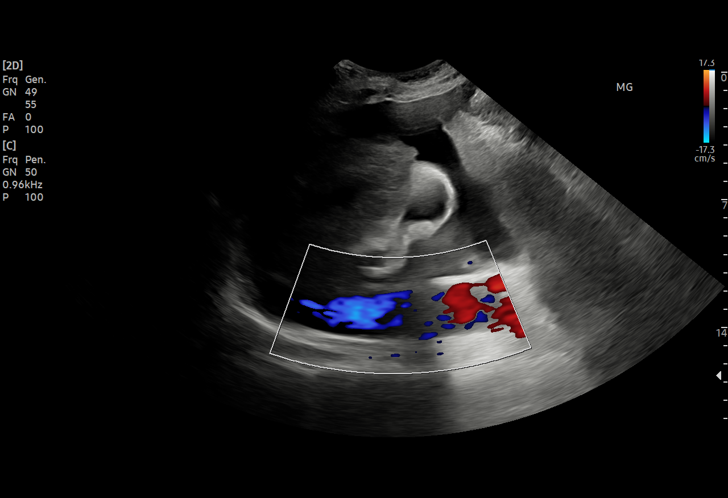
[im 51/81]
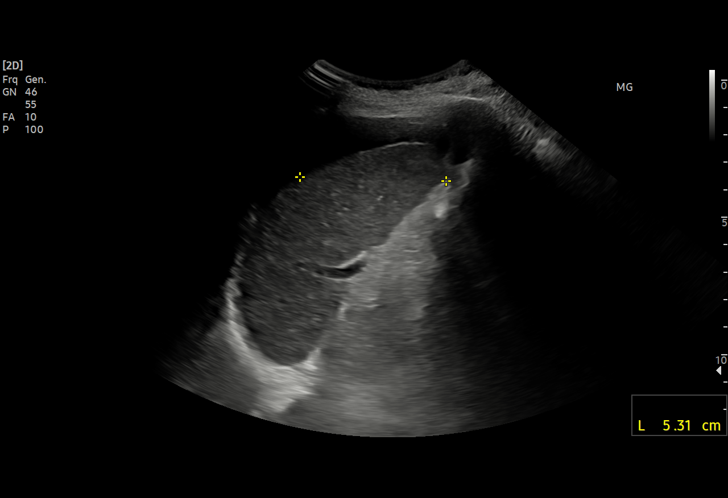
[im 57/81]
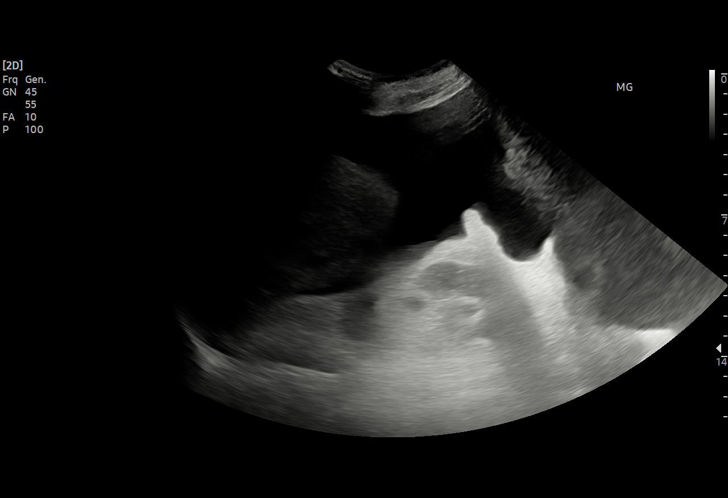
[im 64/81]
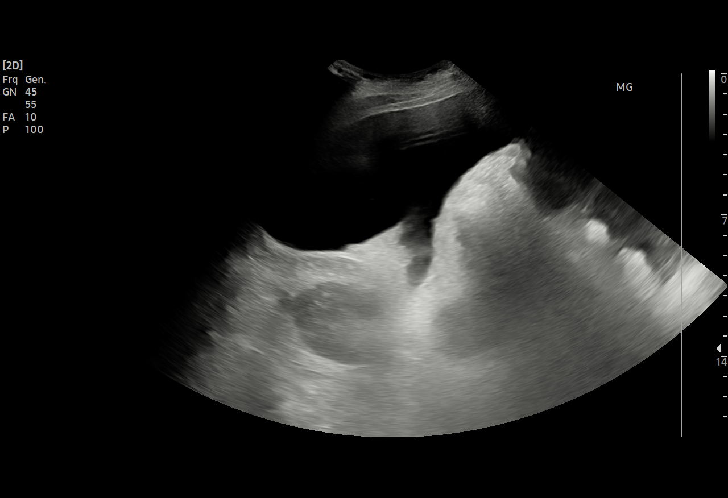
[im 67/81]
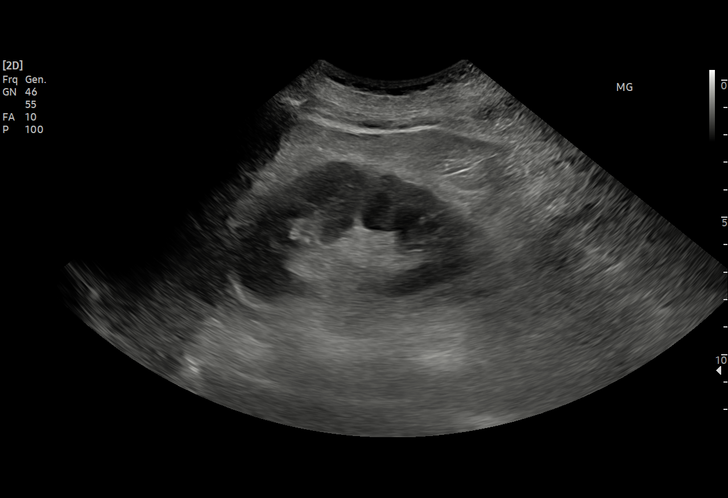
[im 74/81]
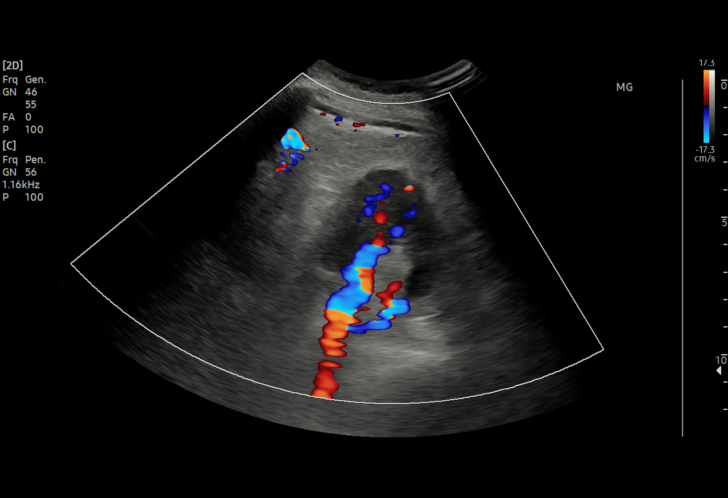
[im 81/81]
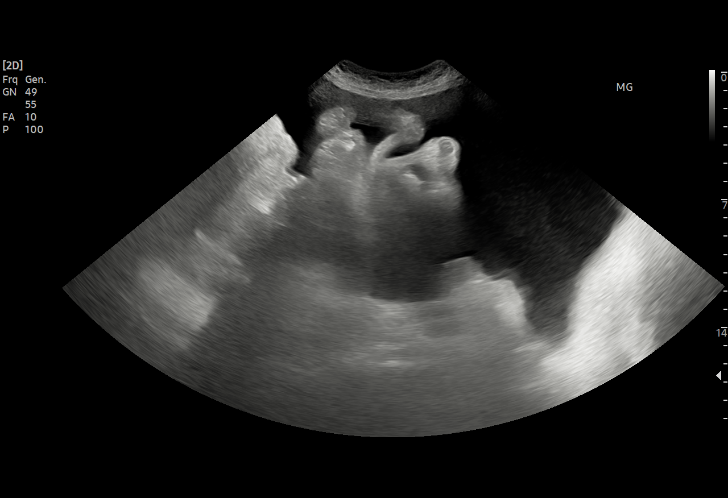

[15 of 25 positions shown; findings below may reference images not displayed]

FINDINGS: Gallbladder: The gallbladder wall is thickened to 5.3 mm. No
Murphy's sign or sludge. Pericholecystic fluid is consistent with
known ascites. Cholelithiasis is identified.

Common bile duct: Diameter: 4.4 mm

Liver: Liver demonstrates marked heterogeneous echotexture and a
nodular contour. No focal liver masses are identified. Portal vein
is patent on color Doppler imaging with normal direction of blood
flow towards the liver.

IVC: No abnormality visualized.

Pancreas: Limited evaluation due to body habitus.

Spleen: Size and appearance within normal limits.

Right Kidney: Length: 10.3 cm. Echogenicity within normal limits. No
mass or hydronephrosis visualized.

Left Kidney: Length: 9.5 cm. Echogenicity within normal limits. No
mass or hydronephrosis visualized.

Abdominal aorta: Not visualized distally. Visualized portions
demonstrate no aneurysm.

Other findings: Significant ascites identified. There is a ventral
hernia containing ascites and intra-abdominal contents.
IMPRESSION: 1. The liver demonstrates a cirrhotic morphology. The liver is
shrunken in appearance with a nodular contour. No focal liver mass
identified.
2. The gallbladder wall is thickened to 5.3 mm. This is nonspecific
but may be due to known cirrhosis. Cholelithiasis is also noted
without Murphy's sign.
3. Significant ascites.
4. There is a ventral hernia containing ascites and intra-abdominal
contents.

## 2022-10-01 ENCOUNTER — Ambulatory Visit: Payer: Medicare Other | Attending: Cardiovascular Disease

## 2022-10-01 DIAGNOSIS — Z952 Presence of prosthetic heart valve: Secondary | ICD-10-CM | POA: Diagnosis not present

## 2022-10-01 DIAGNOSIS — Z7901 Long term (current) use of anticoagulants: Secondary | ICD-10-CM | POA: Insufficient documentation

## 2022-10-01 LAB — POCT INR: INR: 2.8 (ref 2.0–3.0)

## 2022-10-01 NOTE — Patient Instructions (Signed)
CONTINUE TAKING WARFARIN 0.5 TABLET DAILY EXCEPT 1 TABLET MONDAY, WEDNESDAY AND FRIDAY. Continue leafy vegetables in your diet & stay consistent. Recheck INR in 8 weeks. Please call with any questions. Coumadin Clinic [phone]

## 2022-10-12 ENCOUNTER — Ambulatory Visit
Admission: RE | Admit: 2022-10-12 | Discharge: 2022-10-12 | Disposition: A | Discharge status: Home / Self Care | Payer: Medicare Other | Source: Ambulatory Visit | Attending: Family Medicine | Admitting: Family Medicine

## 2022-10-12 DIAGNOSIS — E2839 Other primary ovarian failure: Secondary | ICD-10-CM

## 2022-11-23 ENCOUNTER — Encounter: Payer: Self-pay | Admitting: Family Medicine

## 2022-11-24 ENCOUNTER — Telehealth: Payer: Self-pay | Admitting: Gastroenterology

## 2022-11-24 ENCOUNTER — Encounter: Payer: Self-pay | Admitting: Cardiovascular Disease

## 2022-11-24 ENCOUNTER — Ambulatory Visit (INDEPENDENT_AMBULATORY_CARE_PROVIDER_SITE_OTHER): Payer: Medicare Other

## 2022-11-24 ENCOUNTER — Ambulatory Visit: Payer: Medicare Other | Attending: Cardiovascular Disease | Admitting: Cardiovascular Disease

## 2022-11-24 VITALS — BP 128/58 | HR 72 | Ht <= 58 in | Wt 143.8 lb

## 2022-11-24 DIAGNOSIS — Z952 Presence of prosthetic heart valve: Secondary | ICD-10-CM

## 2022-11-24 DIAGNOSIS — R188 Other ascites: Secondary | ICD-10-CM | POA: Diagnosis present

## 2022-11-24 DIAGNOSIS — I2721 Secondary pulmonary arterial hypertension: Secondary | ICD-10-CM | POA: Insufficient documentation

## 2022-11-24 DIAGNOSIS — I071 Rheumatic tricuspid insufficiency: Secondary | ICD-10-CM | POA: Insufficient documentation

## 2022-11-24 DIAGNOSIS — I4891 Unspecified atrial fibrillation: Secondary | ICD-10-CM

## 2022-11-24 DIAGNOSIS — K7460 Unspecified cirrhosis of liver: Secondary | ICD-10-CM | POA: Insufficient documentation

## 2022-11-24 DIAGNOSIS — I10 Essential (primary) hypertension: Secondary | ICD-10-CM | POA: Diagnosis present

## 2022-11-24 DIAGNOSIS — I50812 Chronic right heart failure: Secondary | ICD-10-CM | POA: Diagnosis not present

## 2022-11-24 DIAGNOSIS — I361 Nonrheumatic tricuspid (valve) insufficiency: Secondary | ICD-10-CM | POA: Diagnosis not present

## 2022-11-24 DIAGNOSIS — D6869 Other thrombophilia: Secondary | ICD-10-CM | POA: Insufficient documentation

## 2022-11-24 DIAGNOSIS — I252 Old myocardial infarction: Secondary | ICD-10-CM | POA: Diagnosis present

## 2022-11-24 DIAGNOSIS — I4821 Permanent atrial fibrillation: Secondary | ICD-10-CM | POA: Insufficient documentation

## 2022-11-24 DIAGNOSIS — R932 Abnormal findings on diagnostic imaging of liver and biliary tract: Secondary | ICD-10-CM

## 2022-11-24 LAB — POCT INR: INR: 2.8 (ref 2.0–3.0)

## 2022-11-24 NOTE — Patient Instructions (Signed)
Medication Instructions:  No changes *If you need a refill on your cardiac medications before your next appointment, please call your pharmacy*  Testing/Procedures: Your physician has requested that you have an echocardiogram. Echocardiography is a painless test that uses sound waves to create images of your heart. It provides your doctor with information about the size and shape of your heart and how well your heart's chambers and valves are working. This procedure takes approximately one hour. There are no restrictions for this procedure. Please do NOT wear cologne, perfume, aftershave, or lotions (deodorant is allowed). Please arrive 15 minutes prior to your appointment time.    Follow-Up: At [facility], you and your health needs are our priority.  As part of our continuing mission to provide you with exceptional heart care, we have created designated Provider Care Teams.  These Care Teams include your primary Cardiologist (physician) and Advanced Practice Providers (APPs -  Physician Assistants and Nurse Practitioners) who all work together to provide you with the care you need, when you need it.  We recommend signing up for the patient portal called "MyChart".  Sign up information is provided on this After Visit Summary.  MyChart is used to connect with patients for Virtual Visits (Telemedicine).  Patients are able to view lab/test results, encounter notes, upcoming appointments, etc.  Non-urgent messages can be sent to your provider as well.   To learn more about what you can do with MyChart, go to [email or web address].    Your next appointment:   1 year(s)  Provider:   Mihai Croitoru, MD      

## 2022-11-24 NOTE — Telephone Encounter (Signed)
Chelsea Boyer see message from pt PCP.  Is it ok to set up paracentesis or does she need f/u?

## 2022-11-24 NOTE — Telephone Encounter (Signed)
Zehr, Princella Pellegrini, PA-C  Loretha Stapler, RN She does need a follow-up appointment in the near future though since last time she was seen was March.  Understand it may be a while though so go ahead and order the paracentesis.  Then do a CBC, CMP and an AFP a week after her paracentesis.  Thank you.

## 2022-11-24 NOTE — Telephone Encounter (Signed)
Inbound call from patient's PCP requesting for a paracentesis for patient to remove abdominal fluid states it is best to call patient back to further discuss. Please advise, thank you.

## 2022-11-24 NOTE — Patient Instructions (Signed)
Description   CONTINUE TAKING WARFARIN 0.5 TABLET DAILY EXCEPT 1 TABLET MONDAY, WEDNESDAY AND FRIDAY.  Continue leafy vegetables in your diet & stay consistent.  Recheck INR in 7 weeks.  Please call with any questions. Coumadin Clinic [phone]

## 2022-11-24 NOTE — Progress Notes (Signed)
Cardiology Consultation Note:    Date:  11/28/2022   ID:  Chelsea Boyer, DOB [birth year], MRN [id number]  PCP:  Shon Hale, MD   [facility]  Cardiologist:  Thurmon Fair, MD  Advanced Practice Provider:  No care team member to display Electrophysiologist:  None       Referring MD: Shon Hale, *   Chief Complaint  Patient presents with   Cardiac Valve Problem     History of Present Illness:    Chelsea Boyer is a 77 y.o. female with a hx of suspected rheumatic heart disease and multiple subsequent valvular surgeries.  She has relocated from [place] to be closer to her grandchildren.  She generally feels well and has not been troubled by dyspnea, ascites or edema.  She does not have symptoms of hepatic encephalopathy.  She has mild exertional dyspnea (NYHA functional class II, but denies orthopnea or PND.  She has not had palpitations, dizziness or syncope.  She denies chest pain.  She has not had any bleeding problems.  She follows in our Coumadin clinic and her warfarin requirements have been pretty steady over the last 3 to 4 months, although her INR was running a little high in the September-March period.  INR today was 2.8.  She has not had any subtherapeutic INR's in the last 12 months.  She has severe tricuspid regurgitation and pulmonary hypertension, which are the causes for right heart failure and probably the reason for her cirrhosis.  She is planning to follow-up with Dr. Russella Dar in the gastroenterology clinic.  Her most recent abdominal ultrasound performed in April 2024 did show evidence of cirrhosis and ascites, but she did not require paracentesis.  Her portal vein is patent with normal hepatopetal flow.  Incidentally noted 8 mm gallstone without signs of cholecystitis.  She has also establish nephrology follow-up with Dr. Signe Colt at [facility].  Most recent creatinine was 1.39  She was initially diagnosed with mitral valve  disease and underwent mitral valve replacement with a biological prosthesis in 1986 (at that point she was still establishing her own family).  She subsequent underwent replacement of the biological prosthesis with a mechanical valve (Saint Jude 25 mm) in 1996.  During follow-up she developed aortic valve stenosis and underwent TAVR with a Edwards SAPIEN valve (23 mm, [id number]) on August 21. 2018.  She has a longstanding history of atrial fibrillation and after failing 3 previous attempts at cardioversion is being managed with rate control for multiple years.  Review of echocardiogram shows that she initially had mild-moderate tricuspid insufficiency as recently as 2013 that progressively worsened.  Since 2015 her tricuspid valve regurgitation has been described as being at least moderate to severe.    Before her TAVR procedure she was diagnosed with a non-STEMI, but as far as I understand, coronary angiography did not show any evidence of significant coronary stenoses (report not available).  Following her TAVR procedure in 2018 she developed ascites and severe lower extremity edema and was diagnosed with cirrhosis, but did not have esophageal varices.  This has been managed successfully with diuretics. She had a more extensive evaluation for pulmonary hypertension including a normal VQ scan and overnight oximetry and high resolution CT scan of the chest studies to exclude pulmonary embolism and obstructive sleep apnea and interstitial lung disease, respectively.  Her most recent echocardiogram shows normal left ventricular systolic function, normal function of the mechanical mitral valve prosthesis and TAVR prosthesis, severe tricuspid  insufficiency.  The estimated systolic PA pressure was 53 mmHg (but estimation of the right atrial pressure is likely to be inaccurate due to severe TR and the PA pressure could actually be higher).  Additional medical problems include well treated hypertension and  hypothyroidism.  Past Medical History:  Diagnosis Date   Atrial fibrillation (HCC)    Heart disease    aortic and mitral valve problems   Hepatic cirrhosis (HCC)    Hypothyroidism     Past Surgical History:  Procedure Laterality Date   ANKLE SURGERY  1998   AORTIC VALVE REPLACEMENT  2018   CESAREAN SECTION     MITRAL VALVE REPLACEMENT     x 2   TOTAL HIP ARTHROPLASTY Left 2014    Current Medications: Current Meds  Medication Sig   alendronate (FOSAMAX) 70 MG tablet Take 70 mg by mouth once a week.   amoxicillin (AMOXIL) 500 MG capsule TAKE 4 TABLETS (2000 MG) PRIOR TO THE DENTAL VISIT   Cholecalciferol (VITAMIN D3) 50 MCG (2000 UT) TABS Take 6,000 Units by mouth daily.   fluticasone (FLONASE) 50 MCG/ACT nasal spray Place 1 spray into both nostrils daily.   levothyroxine (SYNTHROID) 150 MCG tablet Take 150 mcg by mouth daily before breakfast.   metoprolol succinate (TOPROL-XL) 50 MG 24 hr tablet TAKE 1 TABLET BY MOUTH EVERY DAY WITH OR IMMEDIATELY FOLLOWING A MEAL   spironolactone (ALDACTONE) 25 MG tablet Take 1 tablet (25 mg total) by mouth every other day. (Patient taking differently: Take 50 mg by mouth daily.)   torsemide (DEMADEX) 20 MG tablet TAKE 1 TABLET (20 MG TOTAL) BY MOUTH DAILY. FOR A WEIGHT OVER 160 LBS, TAKE 40 MG ONCE DAILY.   warfarin (COUMADIN) 5 MG tablet TAKE 1/2 TO 1 TABLET BY MOUTH DAILY AS DIRECTED BY COUMADIN CLINIC     Allergies:   Patient has no known allergies.   Social History   Socioeconomic History   Marital status: Married    Spouse name: Not on file   Number of children: 1   Years of education: Not on file   Highest education level: Not on file  Occupational History   Occupation: retired  Tobacco Use   Smoking status: Never   Smokeless tobacco: Never  Vaping Use   Vaping status: Never Used  Substance and Sexual Activity   Alcohol use: Yes    Comment: rarely   Drug use: Not on file   Sexual activity: Not on file  Other Topics  Concern   Not on file  Social History Narrative   Not on file   Social Determinants of Health   Financial Resource Strain: Not on file  Food Insecurity: Not on file  Transportation Needs: Not on file  Physical Activity: Not on file  Stress: Not on file  Social Connections: Not on file     Family History: The patient's family history includes Breast cancer in her maternal grandmother; Diabetes in her brother; Emphysema in her brother; Heart attack in her brother; Heart disease in her brother; Heart disease (age of onset: 56) in her mother; Rheumatic fever in her mother.  ROS:   Please see the history of present illness.     All other systems reviewed and are negative.  EKGs/Labs/Other Studies Reviewed:    The following studies were reviewed today:  10/14/2021 echocardiogram  1. Left ventricular ejection fraction, by estimation, is 55 to 60%. The  left ventricle has normal function. The left ventricle demonstrates  regional wall  motion abnormalities. Septal hypokinesis. There is mild left  ventricular hypertrophy. There is the  interventricular septum is flattened in diastole ('D' shaped left  ventricle), consistent with right ventricular volume overload. The average  left ventricular global longitudinal strain is -27.1 %.   2. Right ventricular systolic function is mildly reduced. The right  ventricular size is moderately enlarged. There is moderately elevated  pulmonary artery systolic pressure. The estimated right ventricular  systolic pressure is 52.9 mmHg.   3. Right atrial size was severely dilated.   4. There is a 25 mm St. Jude mechanical valve present in the mitral  position     No evidence of mitral valve regurgitation. The mean mitral valve  gradient is 6.0 mmHg with average heart rate of 58 bpm, stable from prior  echo.   5. The tricuspid valve is abnormal. Tricuspid valve regurgitation is  severe.   6. There is a 23 mm Sapien prosthetic (TAVR) valve present in  the aortic  position     Aortic valve regurgitation is mild. There is a 23 mm Sapien prosthetic  (TAVR) valve present in the aortic position. Aortic valve mean gradient  measures 15.0 mmHg, stable from prior echo. Vmax 2.6 m/s, MG , EOA  2.3 cm^2, DI 0.67   7. The inferior vena cava is dilated in size with <50% respiratory  variability, suggesting right atrial pressure of 15 mmHg.   Comparison(s): 10/02/20 EF 60-65%. MV peak PG, mean PG. AV  mean PG, peak PG. Moderate-severe TR.  Office records, labs, nuclear stress test and numerous echocardiograms from [facility].    The most recent echocardiogram is from 03/06/2020.  This shows normal left ventricular size and function normally functioning aortic valve prosthesis (peak velocity 3 m/s, maximum gradient 35 mmHg, mean gradient 22 mmHg, trace aortic insufficiency, not specified whether perivalvular or intravalvular), normally functioning mechanical mitral valve prosthesis with a mean gradient of 7 mmHg (heart rate not specified, pressure half-time not reported), moderate to severe tricuspid insufficiency, calculated PA systolic pressure 44 mmHg assuming RA pressure 3 mmHg.  The most recent nuclear perfusion study is dated June 14, 2012 and shows a an apical attenuation defect, no ischemia seen, EF 55%  Echocardiogram 10/02/2020:  1. Left ventricular ejection fraction, by estimation, is 60 to 65%. The  left ventricle has normal function. The left ventricle has no regional  wall motion abnormalities. There is mild concentric left ventricular  hypertrophy. Left ventricular diastolic  function could not be evaluated.   2. Right ventricular systolic function is normal. The right ventricular  size is normal. There is moderately elevated pulmonary artery systolic  pressure. The estimated right ventricular systolic pressure is 45.8 mmHg.   3. Right atrial size was severely dilated.   4. Left atrial size was  severely dilated.   5. The mitral valve has been repaired/replaced. Cannot assess for MR due  to shadowing from mechanical MVR. There is a 25 mm St. Jude biological  prosthesis with mechanical present in the mitral position. Procedure Date:  1996. MV peak gradient, 18.1 mmHg.   The average mean mitral valve gradient is 6.5 mmHg.   6. The aortic valve has been repaired/replaced. Periprosthetic aortic  valve regurgitation is trivial. No aortic stenosis is present. There is a  unknown Sapien prosthetic (TAVR) valve present in the aortic position.  Procedure Date: 2018. Aortic valve  mean gradient measures 20.0 mmHg. Aortic valve Vmax measures 2.93 m/s.   7. Tricuspid valve  regurgitation is moderate to severe.   8. The inferior vena cava is normal in size with greater than 50%  respiratory variability, suggesting right atrial pressure of 3 mmHg.   9. Compared to echo 2021, mean AVG has decreased from 22 to and  average mean MVG is stable from prior echo of 2021 ( ). Moderate to  severe TR is unchanged. Diastolic function cannot be assess due to  underlying atrial fibrillation as well as  MVR. PASP has not changed ( in 2021).   EKG:  EKG is ordered today.  Personally reviewed, shows atrial fibrillation with controlled ventricular response and left bundle branch block with left axis deviation, unchanged from previous tracings. 06/09/2022: ALT 11; BUN 54; Creatinine, Ser 1.33; Hemoglobin 11.2; Platelets 168.0; Potassium 4.3; Sodium 124   11/19/2022 Hemoglobin 10.9, creatinine 1.39, potassium 4.5, ALT 11, TSH 0.13  Recent Lipid Panel No results found for: "CHOL", "TRIG", "HDL", "CHOLHDL", "VLDL", "LDLCALC", "LDLDIRECT" 11/19/2022 Cholesterol 128, HDL 38, LDL 70, triglycerides 68  Risk Assessment/Calculations:    CHA2DS2-VASc Score =   Not appropriate (mechanical heart valve) This indicates a  % annual risk of stroke. The patient's score is based upon:      Physical Exam:     VS:  BP (!) 128/58 (BP Location: Left Arm, Patient Position: Sitting, Cuff Size: Normal)   Pulse 72   Ht 4' 9.5" (1.461 m)   Wt 143 lb 12.8 oz (65.2 kg)   SpO2 99%   BMI 30.58 kg/m     Wt Readings from Last 3 Encounters:  11/24/22 143 lb 12.8 oz (65.2 kg)  06/09/22 145 lb (65.8 kg)  12/16/21 154 lb 6 oz (70 kg)     General: Alert, oriented x3, no distress, borderline obese Head: no evidence of trauma, PERRL, EOMI, no exophtalmos or lid lag, no myxedema, no xanthelasma; normal ears, nose and oropharynx Neck: Average JVP around 6-7 cm with very prominent V waves to the angle of the jaw; brisk carotid pulses without delay and no carotid bruits Chest: clear to auscultation, no signs of consolidation by percussion or palpation, normal fremitus, symmetrical and full respiratory excursions Cardiovascular: normal position and quality of the apical impulse, RV lift present, irregular rhythm, crisp prosthetic valve clicks, no diastolic murmurs, rubs or gallops.  3/6 holosystolic murmur heard broadly throughout the precordium, loudest at the left lower sternal border. Abdomen: no tenderness or distention, no masses by palpation, no abnormal pulsatility or arterial bruits, normal bowel sounds, no hepatosplenomegaly Extremities: no clubbing, cyanosis, 1+ symmetrical retromalleolar edema; 2+ radial, ulnar and brachial pulses bilaterally; 2+ right femoral, posterior tibial and dorsalis pedis pulses; 2+ left femoral, posterior tibial and dorsalis pedis pulses; no subclavian or femoral bruits Neurological: grossly nonfocal Psych: Normal mood and affect    ASSESSMENT:    1. Permanent atrial fibrillation (HCC)   2. Chronic right-sided heart failure (HCC)   3. PAH (pulmonary artery hypertension) (HCC)   4. Rheumatic tricuspid valve regurgitation   5. Nonrheumatic tricuspid valve regurgitation   6. H/O mitral valve replacement (mechanical prosthesis)   7. H/O aortic valve replacement (TAVR)   8.  History of myocardial infarction   9. Essential hypertension   10. Cirrhosis of liver with ascites, unspecified hepatic cirrhosis type (HCC)   11. Acquired thrombophilia (HCC)      PLAN:    In order of problems listed above:  Right heart failure/PAH: As far as I can tell she is euvolemic.  Jugular vein pulsations are only slightly  elevated on average, although she has very prominent V waves from severe tricuspid regurgitation.  No obvious ascites and minimum evidence of ankle edema.  Continue current dose of diuretics.  Repeat her echocardiogram to reassess PA pressure and RV function.  She is currently taking spironolactone 50 mg daily and alternates torsemide 20 mg / 40 mg daily. Severe tricuspid regurgitation: It is hard to say whether there is rheumatic involvement of the tricuspid valve or this is simply due to sequelae from longstanding mitral valve disease.  She has had 2 previous sternotomies and is not a candidate for a third open heart procedure.  She remains very skeptical of undergoing a Tri-clip procedure. Mechanical MVR: Repeat echocardiogram.  She has a chronic moderately elevated gradient across the mitral valve which has been stable (6 mmHg at 68 bpm).   Aware of the need for endocarditis prophylaxis. TAVR: Stable gradients (mean 50 mmHg, dimensionless index 0.67).  There is mild perivalvular leak (findings generally unchanged from previous echocardiograms in [place]). Hx of NSTEMI: She denies angina pectoris.  I suspect that this was not due to conventional coronary disease.  Could have been cardioembolic or demand ischemia in the setting of severe aortic stenosis.  There is no mention of coronary disease in her records. Permanent atrial fibrillation: She has severe biatrial dilation and severe TR.  Fully anticoagulated with warfarin. HTN: Notes rather low diastolic blood pressure.  Continue beta-blocker necessary for rate control and spironolactone with benefits for liver disease  related hemodynamic complications.  Option to switch to a nonselective beta-blocker if necessary for portal hypertension. 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  She remains well compensated without evidence of encephalopathy, parenchymal insufficiency on labs or gastrointestinal bleeding and without clinically significant amount of ascites.  Hepatopetal portal vein flow on liver ultrasound. Anticoagulation: Denies bleeding complications.  INR 2.8 today.     Medication Adjustments/Labs and Tests Ordered: Current medicines are reviewed at length with the patient today.  Concerns regarding medicines are outlined above.  Orders Placed This Encounter  Procedures   EKG 12-Lead   ECHOCARDIOGRAM COMPLETE    No orders of the defined types were placed in this encounter.    Patient Instructions  Medication Instructions:  No changes *If you need a refill on your cardiac medications before your next appointment, please call your pharmacy*   Testing/Procedures: Your physician has requested that you have an echocardiogram. Echocardiography is a painless test that uses sound waves to create images of your heart. It provides your doctor with information about the size and shape of your heart and how well your heart's chambers and valves are working. This procedure takes approximately one hour. There are no restrictions for this procedure. Please do NOT wear cologne, perfume, aftershave, or lotions (deodorant is allowed). Please arrive 15 minutes prior to your appointment time.    Follow-Up: At [facility], you and your health needs are our priority.  As part of our continuing mission to provide you with exceptional heart care, we have created designated Provider Care Teams.  These Care Teams include your primary Cardiologist (physician) and Advanced Practice Providers (APPs -  Physician Assistants and Nurse Practitioners) who all work together to provide you with the care  you need, when you need it.  We recommend signing up for the patient portal called "MyChart".  Sign up information is provided on this After Visit Summary.  MyChart is used to connect with patients for Virtual Visits (Telemedicine).  Patients are  able to view lab/test results, encounter notes, upcoming appointments, etc.  Non-urgent messages can be sent to your provider as well.   To learn more about what you can do with MyChart, go to [email or web address].    Your next appointment:   1 year(s)  Provider:   Thurmon Fair, MD        Signed, Thurmon Fair, MD  11/28/2022 10:26 AM    [facility]

## 2022-11-25 ENCOUNTER — Other Ambulatory Visit: Payer: Self-pay

## 2022-11-25 DIAGNOSIS — R188 Other ascites: Secondary | ICD-10-CM

## 2022-11-25 NOTE — Telephone Encounter (Signed)
Paracentesis order has been entered and sent to the schedulers   Appt made to see Dr Russella Dar on 01/11/23 at 3:20 pm   Lab order has been entered   Is she still on torsemide 40 mg daily and spironolactone 50 mg daily.    When is the last time she saw renal   when she is due to see them again.    Does she weigh herself regularly at home?  If so, what is her weight this morning?   Left message on machine to call back

## 2022-11-25 NOTE — Telephone Encounter (Signed)
The pt states she does not wish to proceed with paracentesis at this time. She will keep the f/u with Dr Russella Dar on October.

## 2022-11-25 NOTE — Telephone Encounter (Signed)
Patient called requesting to speak with Doug Sou Pa-[facility] regarding a paracentesis appt that was recommended for her.

## 2022-11-25 NOTE — Telephone Encounter (Signed)
I spoke with the pt and she tells me that she is taking torsemide 40 mg every other day and 20 mg on the other days, she is taking spironolactone 50 mg daily.    She agrees to the appt with Dr Russella Dar on 01/11/23 at 3:20 pm   She last saw Renal 2 weeks ago and has a follow up set up in October.  He weight this morning was 143 lbs but she states it stays consistent between 141-143 lbs.    She tells me that she is not sure she needs the paracentesis but her PCP is requesting.  Please advise.

## 2022-11-28 ENCOUNTER — Encounter: Payer: Self-pay | Admitting: Cardiovascular Disease

## 2022-12-01 ENCOUNTER — Other Ambulatory Visit: Payer: Self-pay | Admitting: Cardiovascular Disease

## 2022-12-01 DIAGNOSIS — Z7901 Long term (current) use of anticoagulants: Secondary | ICD-10-CM

## 2022-12-01 DIAGNOSIS — Z952 Presence of prosthetic heart valve: Secondary | ICD-10-CM

## 2022-12-01 DIAGNOSIS — I4891 Unspecified atrial fibrillation: Secondary | ICD-10-CM

## 2022-12-01 DIAGNOSIS — I824Y9 Acute embolism and thrombosis of unspecified deep veins of unspecified proximal lower extremity: Secondary | ICD-10-CM

## 2022-12-01 NOTE — Telephone Encounter (Signed)
Refill request for warfarin:  Last INR was 2.8 on 11/24/22 Next INR due 01/12/23 LOV was 11/24/22  Refill approved.

## 2022-12-15 ENCOUNTER — Ambulatory Visit (HOSPITAL_COMMUNITY): Payer: Medicare Other | Attending: Cardiovascular Disease

## 2022-12-15 DIAGNOSIS — I361 Nonrheumatic tricuspid (valve) insufficiency: Secondary | ICD-10-CM | POA: Insufficient documentation

## 2022-12-15 DIAGNOSIS — Z952 Presence of prosthetic heart valve: Secondary | ICD-10-CM | POA: Insufficient documentation

## 2022-12-15 LAB — ECHOCARDIOGRAM COMPLETE
AV Mean grad: 23.5 mmHg
AV Peak grad: 41.2 mmHg
Ao pk vel: 3.21 m/s
Area-P 1/2: 4.31 cm2
P 1/2 time: 403 msec
S' Lateral: 2.5 cm

## 2023-01-11 ENCOUNTER — Encounter: Payer: Self-pay | Admitting: Gastroenterology

## 2023-01-11 ENCOUNTER — Ambulatory Visit (INDEPENDENT_AMBULATORY_CARE_PROVIDER_SITE_OTHER): Payer: Medicare Other | Admitting: Gastroenterology

## 2023-01-11 ENCOUNTER — Other Ambulatory Visit (INDEPENDENT_AMBULATORY_CARE_PROVIDER_SITE_OTHER): Payer: Medicare Other

## 2023-01-11 VITALS — BP 122/74 | HR 67 | Ht 59.0 in | Wt 142.1 lb

## 2023-01-11 DIAGNOSIS — Z9889 Other specified postprocedural states: Secondary | ICD-10-CM

## 2023-01-11 DIAGNOSIS — F1090 Alcohol use, unspecified, uncomplicated: Secondary | ICD-10-CM | POA: Diagnosis not present

## 2023-01-11 DIAGNOSIS — I8500 Esophageal varices without bleeding: Secondary | ICD-10-CM | POA: Diagnosis not present

## 2023-01-11 DIAGNOSIS — R188 Other ascites: Secondary | ICD-10-CM

## 2023-01-11 DIAGNOSIS — K7460 Unspecified cirrhosis of liver: Secondary | ICD-10-CM

## 2023-01-11 LAB — CBC WITH DIFFERENTIAL/PLATELET
Basophils Absolute: 0.1 10*3/uL (ref 0.0–0.1)
Basophils Relative: 1.5 % (ref 0.0–3.0)
Eosinophils Absolute: 0.3 10*3/uL (ref 0.0–0.7)
Eosinophils Relative: 7 % — ABNORMAL HIGH (ref 0.0–5.0)
HCT: 36.3 % (ref 36.0–46.0)
Hemoglobin: 11.7 g/dL — ABNORMAL LOW (ref 12.0–15.0)
Lymphocytes Relative: 14.6 % (ref 12.0–46.0)
Lymphs Abs: 0.6 10*3/uL — ABNORMAL LOW (ref 0.7–4.0)
MCHC: 32.2 g/dL (ref 30.0–36.0)
MCV: 90.2 fL (ref 78.0–100.0)
Monocytes Absolute: 0.6 10*3/uL (ref 0.1–1.0)
Monocytes Relative: 13.5 % — ABNORMAL HIGH (ref 3.0–12.0)
Neutro Abs: 2.7 10*3/uL (ref 1.4–7.7)
Neutrophils Relative %: 63.4 % (ref 43.0–77.0)
Platelets: 151 10*3/uL (ref 150.0–400.0)
RBC: 4.02 Mil/uL (ref 3.87–5.11)
RDW: 16.5 % — ABNORMAL HIGH (ref 11.5–15.5)
WBC: 4.3 10*3/uL (ref 4.0–10.5)

## 2023-01-11 LAB — COMPREHENSIVE METABOLIC PANEL
ALT: 10 U/L (ref 0–35)
AST: 28 U/L (ref 0–37)
Albumin: 3.9 g/dL (ref 3.5–5.2)
Alkaline Phosphatase: 81 U/L (ref 39–117)
BUN: 61 mg/dL — ABNORMAL HIGH (ref 6–23)
CO2: 31 meq/L (ref 19–32)
Calcium: 10 mg/dL (ref 8.4–10.5)
Chloride: 93 meq/L — ABNORMAL LOW (ref 96–112)
Creatinine, Ser: 1.48 mg/dL — ABNORMAL HIGH (ref 0.40–1.20)
GFR: 33.79 mL/min — ABNORMAL LOW (ref >60.00)
Glucose, Bld: 80 mg/dL (ref 70–99)
Potassium: 5.1 meq/L (ref 3.5–5.1)
Sodium: 130 meq/L — ABNORMAL LOW (ref 135–145)
Total Bilirubin: 1.1 mg/dL (ref 0.2–1.2)
Total Protein: 9.2 g/dL — ABNORMAL HIGH (ref 6.0–8.3)

## 2023-01-11 NOTE — Patient Instructions (Signed)
Your provider has requested that you go to the basement level for lab work before leaving today. Press "B" on the elevator. The lab is located at the first door on the left as you exit the elevator.  You have been scheduled for an abdominal ultrasound at [facility] (1st floor of hospital) on 01/13/23 at 7:30am. Please arrive 30 minutes prior to your appointment for registration. Make certain not to have anything to eat or drink 6 hours prior to your appointment. Should you need to reschedule your appointment, please contact radiology at [phone]. This test typically takes about 30 minutes to perform.  The [facility] providers would like to encourage you to use [facility] to communicate with providers for non-urgent requests or questions.  Due to long hold times on the telephone, sending your provider a message by [facility] may be a faster and more efficient way to get a response.  Please allow 48 business hours for a response.  Please remember that this is for non-urgent requests.   Due to recent changes in healthcare laws, you may see the results of your imaging and laboratory studies on MyChart before your provider has had a chance to review them.  We understand that in some cases there may be results that are confusing or concerning to you. Not all laboratory results come back in the same time frame and the provider may be waiting for multiple results in order to interpret others.  Please give [place] 48 hours in order for your provider to thoroughly review all the results before contacting the office for clarification of your results.   Thank you for choosing me and [facility].  Venita Lick. Pleas Koch., MD., Clementeen Graham

## 2023-01-11 NOTE — Progress Notes (Signed)
    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 Her weights are stable on current diuretics. No GI complaints.  She relates her nephrologist is managing her diuretics.  Her weight has been stable and her renal function has slightly improved.  She notes no lower extremity edema.   Labs / Imaging       Latest Ref Rng & Units 06/09/2022   10:20 AM 12/16/2021    3:23 PM 05/25/2021   10:34 AM  Hepatic Function  Total Protein 6.0 - 8.3 g/dL 8.3  8.7  8.4   Albumin 3.5 - 5.2 g/dL 3.5  3.7  3.9   AST 0 - 37 U/L 27  23  21    ALT 0 - 35 U/L 11  10  9    Alk Phosphatase 39 - 117 U/L 77  73  72   Total Bilirubin 0.2 - 1.2 mg/dL 1.0  0.9  1.0        Latest Ref Rng & Units 06/09/2022   10:20 AM 12/16/2021    3:23 PM 05/25/2021   10:34 AM  CBC  WBC 4.0 - 10.5 K/uL 4.1  4.4  4.5   Hemoglobin 12.0 - 15.0 g/dL 16.1  09.6  04.5   Hematocrit 36.0 - 46.0 % 34.2  33.2  32.7   Platelets 150.0 - 400.0 K/uL 168.0  170.0  170.0     Current Medications, Allergies, Past Medical History, Past Surgical History, Family History and Social History were reviewed in [employer] record.   Physical Exam: General: Well developed, well nourished, no acute distress Head: Normocephalic and atraumatic Eyes: Sclerae anicteric, EOMI Ears: Normal auditory acuity Mouth: No deformities or lesions noted Lungs: Clear throughout to auscultation Heart: Regular rate and rhythm; No murmurs, rubs or bruits Abdomen: Soft, non tender and mildly distended. No masses, hepatosplenomegaly or hernias noted. Normal Bowel sounds Rectal: Not  done Musculoskeletal: Symmetrical with no gross deformities  Pulses:  Normal pulses noted Extremities: No edema or deformities noted Neurological: Alert oriented x 4, grossly nonfocal Psychological:  Alert and cooperative. Normal mood and affect   Trenae Brunke T. Russella Dar, MD 01/11/2023, 3:20 PM

## 2023-01-12 ENCOUNTER — Telehealth: Payer: Self-pay | Admitting: Gastroenterology

## 2023-01-12 ENCOUNTER — Other Ambulatory Visit: Payer: Self-pay

## 2023-01-12 ENCOUNTER — Ambulatory Visit: Payer: Medicare Other | Attending: Cardiovascular Disease | Admitting: *Deleted

## 2023-01-12 DIAGNOSIS — R188 Other ascites: Secondary | ICD-10-CM

## 2023-01-12 DIAGNOSIS — I824Y9 Acute embolism and thrombosis of unspecified deep veins of unspecified proximal lower extremity: Secondary | ICD-10-CM | POA: Insufficient documentation

## 2023-01-12 DIAGNOSIS — Z7901 Long term (current) use of anticoagulants: Secondary | ICD-10-CM | POA: Insufficient documentation

## 2023-01-12 DIAGNOSIS — Z952 Presence of prosthetic heart valve: Secondary | ICD-10-CM | POA: Insufficient documentation

## 2023-01-12 DIAGNOSIS — I4821 Permanent atrial fibrillation: Secondary | ICD-10-CM | POA: Insufficient documentation

## 2023-01-12 LAB — POCT INR: INR: 2.6 (ref 2.0–3.0)

## 2023-01-12 NOTE — Patient Instructions (Signed)
Description   CONTINUE TAKING WARFARIN 1/2 TABLET DAILY EXCEPT 1 TABLET MONDAY, WEDNESDAY AND FRIDAY. Continue leafy vegetables in your diet & stay consistent.  Recheck INR in 5 weeks. Please call with any questions. Coumadin Clinic [phone]

## 2023-01-12 NOTE — Telephone Encounter (Signed)
PT is returning call to discuss lab results. Please advise. 

## 2023-01-12 NOTE — Telephone Encounter (Signed)
See results note dated 01/12/23

## 2023-01-13 ENCOUNTER — Ambulatory Visit (HOSPITAL_COMMUNITY)
Admission: RE | Admit: 2023-01-13 | Discharge: 2023-01-13 | Disposition: A | Discharge status: Home / Self Care | Payer: Medicare Other | Source: Ambulatory Visit | Attending: Gastroenterology | Admitting: Gastroenterology

## 2023-01-13 ENCOUNTER — Telehealth: Payer: Self-pay

## 2023-01-13 DIAGNOSIS — R188 Other ascites: Secondary | ICD-10-CM | POA: Diagnosis present

## 2023-01-13 DIAGNOSIS — K7460 Unspecified cirrhosis of liver: Secondary | ICD-10-CM | POA: Insufficient documentation

## 2023-01-13 LAB — AFP TUMOR MARKER: AFP-Tumor Marker: 2.7 ng/mL

## 2023-01-13 NOTE — Telephone Encounter (Signed)
-----   Message from [place] T. Russella Dar sent at 01/13/2023 12:53 PM EDT ----- Regarding: FW: No focal hepatic lesions Cirrhosis Cholelithiasis   Repeat RUQ [place] in 6 months ----- Message ----- From: Interface, Rad Results In Sent: 01/13/2023   9:35 AM EDT To: Meryl Dare, MD

## 2023-01-13 NOTE — Telephone Encounter (Signed)
The patient has been notified of this information and all questions answered.   The pt will be contacted in 6 months for repeat labs and [place]

## 2023-01-19 ENCOUNTER — Other Ambulatory Visit: Payer: Self-pay | Admitting: Cardiovascular Disease

## 2023-02-09 ENCOUNTER — Ambulatory Visit: Payer: Medicare Other | Attending: Cardiovascular Disease

## 2023-02-09 ENCOUNTER — Ambulatory Visit: Payer: Medicare Other

## 2023-02-09 DIAGNOSIS — Z952 Presence of prosthetic heart valve: Secondary | ICD-10-CM | POA: Diagnosis not present

## 2023-02-09 DIAGNOSIS — Z5181 Encounter for therapeutic drug level monitoring: Secondary | ICD-10-CM | POA: Insufficient documentation

## 2023-02-09 LAB — POCT INR: INR: 3.1 — AB (ref 2.0–3.0)

## 2023-02-09 NOTE — Patient Instructions (Signed)
Description   CONTINUE TAKING WARFARIN 1/2 TABLET DAILY EXCEPT 1 TABLET MONDAY, WEDNESDAY AND FRIDAY.  Continue leafy vegetables in your diet & stay consistent.  Recheck INR in 4 weeks.  Please call with any questions. Coumadin Clinic [phone]

## 2023-03-10 ENCOUNTER — Ambulatory Visit: Payer: Medicare Other | Attending: Cardiovascular Disease

## 2023-03-10 DIAGNOSIS — Z952 Presence of prosthetic heart valve: Secondary | ICD-10-CM | POA: Diagnosis not present

## 2023-03-10 LAB — POCT INR: INR: 2.5 (ref 2.0–3.0)

## 2023-03-10 NOTE — Patient Instructions (Signed)
Description   TAKE 1 TABLET TODAY AND THEN CONTINUE TAKING WARFARIN 1/2 TABLET DAILY EXCEPT 1 TABLET MONDAY, WEDNESDAY AND FRIDAY.  Continue leafy vegetables in your diet & stay consistent.  Recheck INR in 5 weeks.  Please call with any questions. Coumadin Clinic [phone]

## 2023-04-13 ENCOUNTER — Other Ambulatory Visit: Payer: Self-pay | Admitting: Cardiovascular Disease

## 2023-04-14 ENCOUNTER — Ambulatory Visit: Payer: Medicare Other

## 2023-04-19 ENCOUNTER — Ambulatory Visit: Payer: Medicare Other | Attending: Cardiovascular Disease

## 2023-04-19 DIAGNOSIS — Z7901 Long term (current) use of anticoagulants: Secondary | ICD-10-CM | POA: Insufficient documentation

## 2023-04-19 DIAGNOSIS — Z952 Presence of prosthetic heart valve: Secondary | ICD-10-CM | POA: Insufficient documentation

## 2023-04-19 LAB — POCT INR: INR: 4.1 — AB (ref 2.0–3.0)

## 2023-04-19 NOTE — Patient Instructions (Signed)
Hold today only then continue 1/2 TABLET DAILY EXCEPT 1 TABLET MONDAY, WEDNESDAY AND FRIDAY.  Continue leafy vegetables in your diet & stay consistent.  Recheck INR in 4 weeks.  Please call with any questions. Coumadin Clinic [phone]

## 2023-05-17 ENCOUNTER — Ambulatory Visit: Payer: Medicare Other | Attending: Cardiology

## 2023-05-17 DIAGNOSIS — Z7901 Long term (current) use of anticoagulants: Secondary | ICD-10-CM | POA: Diagnosis not present

## 2023-05-17 DIAGNOSIS — Z952 Presence of prosthetic heart valve: Secondary | ICD-10-CM

## 2023-05-17 LAB — POCT INR: INR: 3.6 — AB (ref 2.0–3.0)

## 2023-05-17 NOTE — Patient Instructions (Signed)
 continue 1/2 TABLET DAILY EXCEPT 1 TABLET MONDAY, WEDNESDAY AND FRIDAY.  Continue leafy vegetables in your diet & stay consistent. Eat greens tonight Recheck INR in 4 weeks.  Please call with any questions. Coumadin Clinic [phone]

## 2023-06-14 ENCOUNTER — Ambulatory Visit: Payer: Medicare Other | Attending: Cardiology | Admitting: *Deleted

## 2023-06-14 DIAGNOSIS — I824Y9 Acute embolism and thrombosis of unspecified deep veins of unspecified proximal lower extremity: Secondary | ICD-10-CM | POA: Insufficient documentation

## 2023-06-14 DIAGNOSIS — Z7901 Long term (current) use of anticoagulants: Secondary | ICD-10-CM | POA: Insufficient documentation

## 2023-06-14 DIAGNOSIS — I4821 Permanent atrial fibrillation: Secondary | ICD-10-CM | POA: Diagnosis not present

## 2023-06-14 DIAGNOSIS — Z5181 Encounter for therapeutic drug level monitoring: Secondary | ICD-10-CM | POA: Diagnosis present

## 2023-06-14 DIAGNOSIS — Z952 Presence of prosthetic heart valve: Secondary | ICD-10-CM | POA: Insufficient documentation

## 2023-06-14 LAB — POCT INR: INR: 4 — AB (ref 2.0–3.0)

## 2023-06-14 NOTE — Patient Instructions (Addendum)
 Description   Do not take any warfarin today then START taking taking 1/2 TABLET DAILY EXCEPT 1 TABLET MONDAY AND FRIDAY.  Continue leafy vegetables in your diet & stay consistent. Recheck INR in 3 weeks.  Please call with any questions. Coumadin Clinic [phone]

## 2023-07-05 ENCOUNTER — Ambulatory Visit: Attending: Cardiology | Admitting: *Deleted

## 2023-07-05 DIAGNOSIS — Z952 Presence of prosthetic heart valve: Secondary | ICD-10-CM | POA: Diagnosis present

## 2023-07-05 DIAGNOSIS — Z7901 Long term (current) use of anticoagulants: Secondary | ICD-10-CM | POA: Diagnosis present

## 2023-07-05 DIAGNOSIS — I824Y9 Acute embolism and thrombosis of unspecified deep veins of unspecified proximal lower extremity: Secondary | ICD-10-CM | POA: Insufficient documentation

## 2023-07-05 DIAGNOSIS — I4821 Permanent atrial fibrillation: Secondary | ICD-10-CM | POA: Insufficient documentation

## 2023-07-05 LAB — POCT INR: INR: 2.7 (ref 2.0–3.0)

## 2023-07-05 NOTE — Patient Instructions (Signed)
 Description   Continue taking warfarin 1/2 TABLET DAILY EXCEPT 1 TABLET MONDAY AND FRIDAY.  Continue leafy vegetables in your diet & stay consistent. Recheck INR in 4 weeks.  Please call with any questions. Coumadin Clinic [phone]

## 2023-07-14 ENCOUNTER — Telehealth: Payer: Self-pay

## 2023-07-14 NOTE — Telephone Encounter (Signed)
 Spoke with the pt and she has a follow up appt to discuss imaging and labs.

## 2023-07-14 NOTE — Telephone Encounter (Signed)
-----   Message from Nurse Emalynn Clewis P sent at 01/13/2023  1:05 PM EDT ----- Asencion Blacksmith, MD  Aneita Keens, RN No focal hepatic lesions Cirrhosis Cholelithiasis  Repeat RUQ US  in 6 months

## 2023-08-02 ENCOUNTER — Ambulatory Visit: Attending: Cardiovascular Disease | Admitting: *Deleted

## 2023-08-02 DIAGNOSIS — I824Y9 Acute embolism and thrombosis of unspecified deep veins of unspecified proximal lower extremity: Secondary | ICD-10-CM | POA: Insufficient documentation

## 2023-08-02 DIAGNOSIS — Z952 Presence of prosthetic heart valve: Secondary | ICD-10-CM | POA: Diagnosis present

## 2023-08-02 DIAGNOSIS — I4821 Permanent atrial fibrillation: Secondary | ICD-10-CM | POA: Insufficient documentation

## 2023-08-02 DIAGNOSIS — Z7901 Long term (current) use of anticoagulants: Secondary | ICD-10-CM | POA: Insufficient documentation

## 2023-08-02 LAB — POCT INR: INR: 4.5 — AB (ref 2.0–3.0)

## 2023-08-02 NOTE — Patient Instructions (Signed)
 Description   Do not take any warfarin today then continue taking warfarin 1/2 TABLET DAILY EXCEPT 1 TABLET MONDAY AND FRIDAY.  Continue leafy vegetables in your diet & stay consistent. Recheck INR in 4 weeks.  Please call with any questions. Coumadin  Clinic [phone]

## 2023-08-09 ENCOUNTER — Ambulatory Visit: Payer: Self-pay | Admitting: Internal Medicine

## 2023-08-09 ENCOUNTER — Other Ambulatory Visit (INDEPENDENT_AMBULATORY_CARE_PROVIDER_SITE_OTHER)

## 2023-08-09 ENCOUNTER — Ambulatory Visit (INDEPENDENT_AMBULATORY_CARE_PROVIDER_SITE_OTHER): Admitting: Gastroenterology

## 2023-08-09 ENCOUNTER — Encounter: Payer: Self-pay | Admitting: Gastroenterology

## 2023-08-09 VITALS — BP 104/60 | HR 61 | Ht 59.0 in | Wt 140.0 lb

## 2023-08-09 DIAGNOSIS — D649 Anemia, unspecified: Secondary | ICD-10-CM | POA: Diagnosis not present

## 2023-08-09 DIAGNOSIS — R188 Other ascites: Secondary | ICD-10-CM

## 2023-08-09 DIAGNOSIS — K7460 Unspecified cirrhosis of liver: Secondary | ICD-10-CM

## 2023-08-09 DIAGNOSIS — Z8719 Personal history of other diseases of the digestive system: Secondary | ICD-10-CM | POA: Diagnosis not present

## 2023-08-09 DIAGNOSIS — Z7901 Long term (current) use of anticoagulants: Secondary | ICD-10-CM

## 2023-08-09 LAB — CBC WITH DIFFERENTIAL/PLATELET
Basophils Absolute: 0.1 10*3/uL (ref 0.0–0.1)
Basophils Relative: 2 % (ref 0.0–3.0)
Eosinophils Absolute: 0.3 10*3/uL (ref 0.0–0.7)
Eosinophils Relative: 7.6 % — ABNORMAL HIGH (ref 0.0–5.0)
HCT: 34.1 % — ABNORMAL LOW (ref 36.0–46.0)
Hemoglobin: 11.3 g/dL — ABNORMAL LOW (ref 12.0–15.0)
Lymphocytes Relative: 12.6 % (ref 12.0–46.0)
Lymphs Abs: 0.5 10*3/uL — ABNORMAL LOW (ref 0.7–4.0)
MCHC: 33.1 g/dL (ref 30.0–36.0)
MCV: 85.9 fl (ref 78.0–100.0)
Monocytes Absolute: 0.5 10*3/uL (ref 0.1–1.0)
Monocytes Relative: 11.5 % (ref 3.0–12.0)
Neutro Abs: 2.9 10*3/uL (ref 1.4–7.7)
Neutrophils Relative %: 66.3 % (ref 43.0–77.0)
Platelets: 170 10*3/uL (ref 150.0–400.0)
RBC: 3.97 Mil/uL (ref 3.87–5.11)
RDW: 17.6 % — ABNORMAL HIGH (ref 11.5–15.5)
WBC: 4.3 10*3/uL (ref 4.0–10.5)

## 2023-08-09 LAB — COMPREHENSIVE METABOLIC PANEL WITH GFR
ALT: 8 U/L (ref 0–35)
AST: 26 U/L (ref 0–37)
Albumin: 3.8 g/dL (ref 3.5–5.2)
Alkaline Phosphatase: 63 U/L (ref 39–117)
BUN: 60 mg/dL — ABNORMAL HIGH (ref 6–23)
CO2: 27 meq/L (ref 19–32)
Calcium: 10.1 mg/dL (ref 8.4–10.5)
Chloride: 94 meq/L — ABNORMAL LOW (ref 96–112)
Creatinine, Ser: 1.78 mg/dL — ABNORMAL HIGH (ref 0.40–1.20)
GFR: 26.97 mL/min — ABNORMAL LOW (ref >60.00)
Glucose, Bld: 85 mg/dL (ref 70–99)
Potassium: 4.8 meq/L (ref 3.5–5.1)
Sodium: 130 meq/L — ABNORMAL LOW (ref 135–145)
Total Bilirubin: 1.2 mg/dL (ref 0.2–1.2)
Total Protein: 9.4 g/dL — ABNORMAL HIGH (ref 6.0–8.3)

## 2023-08-09 LAB — PROTIME-INR
INR: 4.7 ratio — ABNORMAL HIGH (ref 0.8–1.0)
Prothrombin Time: 46.2 s — ABNORMAL HIGH (ref 9.6–13.1)

## 2023-08-09 NOTE — Progress Notes (Signed)
 08/09/2023 Chelsea Boyer [id number] [birth year]   HISTORY OF PRESENT ILLNESS: This is a 78 year old female who was previously patient of Dr. Emaline Handsome, her care will be assigned to Dr. Dominic Friendly.  She has cirrhosis, likely MASH, with ascites.  Her weights are stable on current diuretics.  Her diuretics are controlled by nephrology.  She is currently on torsemide  40 mg daily and spironolactone  50 mg daily.  No black or bloody stools.  We had discussed paracentesis previously, but she declined.  She does describe a somewhat fullness sensation in her upper abdomen.  She does have a protruding ventral hernia there as well.  No lower extremity edema.  No significant confusion.  Overall stable.  She has declined repeat EGDs.  Her care was established here in February 2022, previously seen at [facility].   EGD 04/11/2018 by Dr. 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  Recommended avoiding NSAIDs, less than 2 g sodium diet, and repeat EGD in 1 to 2 years     Past Medical History:  Diagnosis Date   Atrial fibrillation (HCC)    Heart disease    aortic and mitral valve problems   Hepatic cirrhosis (HCC)    Hypothyroidism    Past Surgical History:  Procedure Laterality Date   ANKLE SURGERY  1998   AORTIC VALVE REPLACEMENT  2018   CESAREAN SECTION     MITRAL VALVE REPLACEMENT     x 2   TOTAL HIP ARTHROPLASTY Left 2014    reports that she has never smoked. She has never used smokeless tobacco. She reports current alcohol use. She reports that she does not currently use drugs. family history includes Breast cancer in her maternal grandmother; Diabetes in her brother; Emphysema in her brother;  Heart attack in her brother; Heart disease in her brother; Heart disease (age of onset: 36) in her mother; Rheumatic fever in her mother. No Known Allergies    Outpatient Encounter Medications as of 08/09/2023  Medication Sig   alendronate (FOSAMAX) 70 MG tablet Take 70 mg by mouth once a week.   amoxicillin  (AMOXIL ) 500 MG capsule TAKE 4 TABLETS (2000 MG) PRIOR TO THE DENTAL VISIT   Cholecalciferol (VITAMIN D3) 50 MCG (2000 UT) TABS Take 6,000 Units by mouth daily.   fluticasone (FLONASE) 50 MCG/ACT nasal spray Place 1 spray into both nostrils daily.   levothyroxine (SYNTHROID) 125 MCG tablet Take 150 mcg by mouth every morning.   metoprolol  succinate (TOPROL -XL) 50 MG 24 hr tablet TAKE 1 TABLET BY MOUTH EVERY DAY WITH OR IMMEDIATELY FOLLOWING A MEAL   spironolactone  (ALDACTONE ) 25 MG tablet Take 1 tablet (25 mg total) by mouth every other day. (Patient taking differently: Take 50 mg by mouth daily.)   torsemide  (DEMADEX ) 20 MG tablet TAKE 1 TABLET BY MOUTH DAILY. FOR A WEIGHT OVER 160 LBS, TAKE 40 MG ONCE DAILY. (Patient taking differently: Take 40 mg by mouth daily.)   warfarin (COUMADIN ) 5 MG tablet TAKE 1/2 TO 1 TABLET BY MOUTH DAILY AS DIRECTED BY COUMADIN  CLINIC (Patient taking differently: TAKE 1/2 TO 1 TABLET BY MOUTH DAILY AS DIRECTED BY COUMADIN  CLINIC Take 0.5 tablet 4 days a week and 1 tablet 3 days a week)  No facility-administered encounter medications on file as of 08/09/2023.     REVIEW OF SYSTEMS  : All other systems reviewed and negative except where noted in the History of Present Illness.   PHYSICAL EXAM: BP 104/60   Pulse 61   Ht 4\' 11"  (1.499 m)   Wt 140 lb (63.5 kg)   BMI 28.28 kg/m  General: Chronically ill-appearing white female in no acute distress Head: Normocephalic and atraumatic Eyes:  Sclerae anicteric, conjunctiva pink. Ears: Normal auditory acuity Lungs: Clear throughout to auscultation; no W/R/R. Heart:  Valve clicks noted. Abdomen: Softly  distended with ascites fluid.  BS present.  Non-tender ventral hernia noted in the epigastrium.  Non-tender. Musculoskeletal: Symmetrical with no gross deformities  Skin: No lesions on visible extremities Extremities: No edema  Neurological: Alert oriented x 4, grossly non-focal Psychological:  Alert and cooperative. 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  Repeat EGD was recommended in 1 to 2 years.  She was scheduled for EGD when she was seen in 11/2020 but canceled and still does not wish to proceed at this time.  Does not want to have to stop coumadin  and bridge to Lovenox.  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  Can call back if she decides to proceed with paracentesis to see if that will help her abdominal distention.   CC:  Chelsea Boyer, *

## 2023-08-09 NOTE — Patient Instructions (Signed)
 Your provider has requested that you go to the basement level for lab work before leaving today. Press "B" on the elevator. The lab is located at the first door on the left as you exit the elevator.  Due to recent changes in healthcare laws, you may see the results of your imaging and laboratory studies on MyChart before your provider has had a chance to review them.  We understand that in some cases there may be results that are confusing or concerning to you. Not all laboratory results come back in the same time frame and the provider may be waiting for multiple results in order to interpret others.  Please give us  48 hours in order for your provider to thoroughly review all the results before contacting the office for clarification of your results.   You have been scheduled for an abdominal ultrasound at [facility] (1st floor of hospital) on 08/18/2023 at 9:30am Please arrive 30 minutes prior to your appointment for registration. Make certain not to have anything to eat or drink 6 hours prior to your appointment. Should you need to reschedule your appointment, please contact radiology at [phone]. This test typically takes about 30 minutes to perform.   I appreciate the opportunity to care for you. Jessica Zehr, PA

## 2023-08-11 ENCOUNTER — Other Ambulatory Visit: Payer: Self-pay

## 2023-08-11 LAB — AFP TUMOR MARKER: AFP-Tumor Marker: 3 ng/mL

## 2023-08-11 NOTE — Progress Notes (Signed)
 Changed to complete  U/S.

## 2023-08-11 NOTE — Progress Notes (Signed)
 ____________________________________________________________  Attending physician addendum:  Thank you for sending this case to me. I have reviewed the entire note and agree with the plan.  However, I recommend changing the right upper quadrant screening ultrasound to a complete abdominal ultrasound.  That will be a more complete assessment of her ascites in addition to screening for [facility].  Lorella Roles, MD  ____________________________________________________________

## 2023-08-17 ENCOUNTER — Ambulatory Visit: Payer: Self-pay | Admitting: Gastroenterology

## 2023-08-18 ENCOUNTER — Ambulatory Visit (HOSPITAL_COMMUNITY)
Admission: RE | Admit: 2023-08-18 | Discharge: 2023-08-18 | Disposition: A | Discharge status: Home / Self Care | Source: Ambulatory Visit | Attending: Gastroenterology | Admitting: Gastroenterology

## 2023-08-18 DIAGNOSIS — K7460 Unspecified cirrhosis of liver: Secondary | ICD-10-CM | POA: Insufficient documentation

## 2023-08-18 DIAGNOSIS — R188 Other ascites: Secondary | ICD-10-CM | POA: Insufficient documentation

## 2023-08-24 ENCOUNTER — Ambulatory Visit: Attending: Cardiovascular Disease | Admitting: *Deleted

## 2023-08-24 DIAGNOSIS — Z7901 Long term (current) use of anticoagulants: Secondary | ICD-10-CM | POA: Insufficient documentation

## 2023-08-24 DIAGNOSIS — I4821 Permanent atrial fibrillation: Secondary | ICD-10-CM | POA: Diagnosis present

## 2023-08-24 DIAGNOSIS — Z952 Presence of prosthetic heart valve: Secondary | ICD-10-CM | POA: Insufficient documentation

## 2023-08-24 DIAGNOSIS — I824Y9 Acute embolism and thrombosis of unspecified deep veins of unspecified proximal lower extremity: Secondary | ICD-10-CM | POA: Diagnosis not present

## 2023-08-24 LAB — POCT INR: INR: 3.9 — AB (ref 2.0–3.0)

## 2023-08-24 NOTE — Patient Instructions (Signed)
 Description   Do not take any warfarin today then continue taking warfarin 1/2 TABLET DAILY EXCEPT 1 TABLET MONDAY AND FRIDAY.  Continue leafy vegetables in your diet & stay consistent. Recheck INR in 4 weeks.  Please call with any questions. Coumadin  Clinic [phone]

## 2023-09-20 NOTE — Progress Notes (Signed)
 Anticoag encounter

## 2023-09-21 ENCOUNTER — Ambulatory Visit: Attending: Cardiovascular Disease | Admitting: *Deleted

## 2023-09-21 DIAGNOSIS — Z7901 Long term (current) use of anticoagulants: Secondary | ICD-10-CM | POA: Insufficient documentation

## 2023-09-21 DIAGNOSIS — I4821 Permanent atrial fibrillation: Secondary | ICD-10-CM | POA: Diagnosis present

## 2023-09-21 DIAGNOSIS — I824Y9 Acute embolism and thrombosis of unspecified deep veins of unspecified proximal lower extremity: Secondary | ICD-10-CM | POA: Insufficient documentation

## 2023-09-21 DIAGNOSIS — Z952 Presence of prosthetic heart valve: Secondary | ICD-10-CM | POA: Diagnosis present

## 2023-09-21 LAB — POCT INR: INR: 3.8 — AB (ref 2.0–3.0)

## 2023-09-21 NOTE — Patient Instructions (Signed)
 Description   Do not take any warfarin today then START taking warfarin 1/2 TABLET DAILY EXCEPT 1 TABLET ON FRIDAYS.  Continue leafy vegetables in your diet & stay consistent. Recheck INR in 4 weeks.  Please call with any questions. Coumadin  Clinic [phone]

## 2023-09-21 NOTE — Progress Notes (Signed)
Please see anticoagulation encounter.

## 2023-10-12 ENCOUNTER — Other Ambulatory Visit: Payer: Self-pay | Admitting: Cardiovascular Disease

## 2023-10-12 DIAGNOSIS — Z7901 Long term (current) use of anticoagulants: Secondary | ICD-10-CM

## 2023-10-12 DIAGNOSIS — Z952 Presence of prosthetic heart valve: Secondary | ICD-10-CM

## 2023-10-12 DIAGNOSIS — I824Y9 Acute embolism and thrombosis of unspecified deep veins of unspecified proximal lower extremity: Secondary | ICD-10-CM

## 2023-10-12 DIAGNOSIS — I4891 Unspecified atrial fibrillation: Secondary | ICD-10-CM

## 2023-10-14 ENCOUNTER — Telehealth: Payer: Self-pay | Admitting: Gastroenterology

## 2023-10-14 LAB — COMPREHENSIVE METABOLIC PANEL WITH GFR: EGFR: 29

## 2023-10-14 NOTE — Telephone Encounter (Signed)
 Inbound [facility] stated patient was recently seen with her PCP and they are concerned about fluid being in her stomach because her stomach is extended. Also stated sodium and liver levels were high and will be sending lab results. Requesting a call back to the patient. Please advise, thank you

## 2023-10-17 NOTE — Telephone Encounter (Signed)
 Spoke with the patient. She denies any abdominal discomfort, heaviness or changes in her breathing. She is not interested in paracentesis at this time.  Call to [facility] at [facility] Dr Linette assistant Orlean. Left a message of my call. Will ask for labs and office note to be faxed to my attention. Did Dr Chrystal feel the patient needed to be evaluated here or did she feel she needed a paracentesis? Also need to know if Dr Gearline at [facility] has been given a copy of the recent labs.

## 2023-10-17 NOTE — Telephone Encounter (Signed)
 Called the patient. No answer. Left a message of my call on her voicemail. (Patient also see nephrology)

## 2023-10-19 ENCOUNTER — Ambulatory Visit: Attending: Cardiovascular Disease | Admitting: *Deleted

## 2023-10-19 DIAGNOSIS — Z7901 Long term (current) use of anticoagulants: Secondary | ICD-10-CM | POA: Insufficient documentation

## 2023-10-19 DIAGNOSIS — Z952 Presence of prosthetic heart valve: Secondary | ICD-10-CM | POA: Insufficient documentation

## 2023-10-19 DIAGNOSIS — I824Y9 Acute embolism and thrombosis of unspecified deep veins of unspecified proximal lower extremity: Secondary | ICD-10-CM | POA: Diagnosis present

## 2023-10-19 DIAGNOSIS — I4821 Permanent atrial fibrillation: Secondary | ICD-10-CM | POA: Insufficient documentation

## 2023-10-19 LAB — POCT INR: INR: 5.3 — AB (ref 2.0–3.0)

## 2023-10-19 NOTE — Telephone Encounter (Signed)
-----   Message from [place] D. Zehr sent at 10/19/2023  8:26 AM EDT ----- Please let her know that I received her labs that she had performed at her PCPs office.  Everything appears completely stable from previous.  Thank you,  Jess

## 2023-10-19 NOTE — Patient Instructions (Signed)
 Description   Do not take any warfarin today and no warfarin tomorrow then START taking the dose you should be taking, which is, warfarin 1/2 TABLET DAILY EXCEPT 1 TABLET ON FRIDAYS.  Continue leafy vegetables in your diet & stay consistent. Recheck INR in 2 weeks.  Please call with any questions. Coumadin  Clinic [phone]

## 2023-10-19 NOTE — Progress Notes (Signed)
 INR-5.3; Please see anticoagulation encounter

## 2023-11-02 ENCOUNTER — Ambulatory Visit: Attending: Cardiovascular Disease

## 2023-11-02 DIAGNOSIS — Z952 Presence of prosthetic heart valve: Secondary | ICD-10-CM | POA: Diagnosis present

## 2023-11-02 LAB — POCT INR: INR: 3.6 — AB (ref 2.0–3.0)

## 2023-11-02 NOTE — Progress Notes (Signed)
 INR 3.6  Please see anticoagulation encounter

## 2023-11-02 NOTE — Patient Instructions (Signed)
 Description   Eat greens today and continue taking the dose you should be taking, which is, warfarin 1/2 TABLET DAILY EXCEPT 1 TABLET ON FRIDAYS.  Continue leafy vegetables in your diet & stay consistent.  Recheck INR in 3 weeks.  Please call with any questions. Coumadin  Clinic [phone]

## 2023-11-23 ENCOUNTER — Ambulatory Visit: Attending: Cardiovascular Disease | Admitting: *Deleted

## 2023-11-23 DIAGNOSIS — Z7901 Long term (current) use of anticoagulants: Secondary | ICD-10-CM | POA: Diagnosis present

## 2023-11-23 DIAGNOSIS — I824Y9 Acute embolism and thrombosis of unspecified deep veins of unspecified proximal lower extremity: Secondary | ICD-10-CM | POA: Insufficient documentation

## 2023-11-23 DIAGNOSIS — I4821 Permanent atrial fibrillation: Secondary | ICD-10-CM | POA: Insufficient documentation

## 2023-11-23 DIAGNOSIS — Z952 Presence of prosthetic heart valve: Secondary | ICD-10-CM | POA: Diagnosis present

## 2023-11-23 LAB — POCT INR: INR: 3.6 — AB (ref 2.0–3.0)

## 2023-11-23 NOTE — Progress Notes (Signed)
 Description   Eat greens today, then START taking 1/2 TABLET DAILY.   Continue leafy vegetables in your diet & stay consistent.  Recheck INR in 3 weeks.  Please call with any questions. Coumadin  Clinic [phone]

## 2023-11-23 NOTE — Patient Instructions (Signed)
 Description   Eat greens today, then START taking 1/2 TABLET DAILY.   Continue leafy vegetables in your diet & stay consistent.  Recheck INR in 3 weeks.  Please call with any questions. Coumadin  Clinic [phone]

## 2023-12-05 ENCOUNTER — Encounter: Payer: Self-pay | Admitting: Gastroenterology

## 2023-12-15 ENCOUNTER — Ambulatory Visit: Attending: Cardiovascular Disease | Admitting: *Deleted

## 2023-12-15 DIAGNOSIS — Z5181 Encounter for therapeutic drug level monitoring: Secondary | ICD-10-CM | POA: Diagnosis present

## 2023-12-15 DIAGNOSIS — Z952 Presence of prosthetic heart valve: Secondary | ICD-10-CM | POA: Insufficient documentation

## 2023-12-15 LAB — POCT INR: POC INR: 3

## 2023-12-15 NOTE — Patient Instructions (Addendum)
 Description   INR 3.0,  Continue  taking warfarin 1/2 TABLET DAILY.   Continue leafy vegetables in your diet & stay consistent.  Recheck INR in 4 weeks ( will schedule appoint on 10/14 because she is seeing Dr. JAYSON that day) Please call with any questions. Coumadin  Clinic [phone]

## 2023-12-15 NOTE — Progress Notes (Signed)
 Lab Results  Component Value Date   INR 3.0 12/15/2023   INR 3.6 (A) 11/23/2023   INR 3.6 (A) 11/02/2023   Description   INR 3.0,  Continue  taking warfarin 1/2 TABLET DAILY.   Continue leafy vegetables in your diet & stay consistent.  Recheck INR in 4 weeks.  Please call with any questions. Coumadin  Clinic [phone]

## 2023-12-23 ENCOUNTER — Ambulatory Visit: Payer: Self-pay

## 2023-12-23 NOTE — Telephone Encounter (Signed)
 FYI Only or Action Required?: FYI only for provider.  Called Nurse Triage reporting Fatigue.  Symptoms began A couple of months ago.  Symptoms are: unchanged.  Triage Disposition: See PCP Within 2 Weeks  Patient/caregiver understands and will follow disposition?: Yes      Copied from CRM [id number]. Topic: Clinical - Red Word Triage >> Dec 23, 2023  1:58 PM Graeme ORN wrote: Red Word that prompted transfer to Nurse Triage: Extreme fatigue, Confusion, Unsteady.      Reason for Disposition  [1] Longstanding confusion (e.g., dementia, stroke) AND [2] NO worsening or change  Answer Assessment - Initial Assessment Questions Patient currently being treated by a doctor for her confusion but wants to change to another office. First available appointment scheduled for 10/20. Patient's husband instructed to call back for new or worsening symptoms or to follow up with her current PCP. He verbalized understanding and agreement with this plan.        1. LEVEL OF CONSCIOUSNESS: How are they (the patient) acting right now? (e.g., alert-oriented, confused, lethargic, stuporous, comatose)     Confused  2. ONSET: When did the confusion start?  (e.g., minutes, hours, days)     1-2 months  3. PATTERN: Does this come and go, or has it been constant since it started?  Is it present now?     Some days are better than others   4. ALCOHOL or DRUGS: Have they been drinking alcohol or taking any drugs?      No 5. NARCOTIC MEDICINES: Have they been receiving any narcotic medications? (e.g., morphine, Vicodin)     No 6. CAUSE: What do you think is causing the confusion?      Unsure  7. OTHER SYMPTOMS: Are there any other symptoms? (e.g., difficulty breathing, fever, headache, weakness)     Fatigue, unsteady gait  Protocols used: Confusion - Delirium-A-AH

## 2023-12-29 ENCOUNTER — Inpatient Hospital Stay (HOSPITAL_COMMUNITY)
Admission: EM | Admit: 2023-12-29 | Discharge: 2024-01-16 | DRG: 286 | Disposition: A | Discharge status: Skilled Nursing Facility | Attending: Internal Medicine | Admitting: Internal Medicine

## 2023-12-29 ENCOUNTER — Emergency Department (HOSPITAL_COMMUNITY)

## 2023-12-29 ENCOUNTER — Other Ambulatory Visit: Payer: Self-pay

## 2023-12-29 DIAGNOSIS — T82857A Stenosis of cardiac prosthetic devices, implants and grafts, initial encounter: Secondary | ICD-10-CM | POA: Diagnosis present

## 2023-12-29 DIAGNOSIS — Z7983 Long term (current) use of bisphosphonates: Secondary | ICD-10-CM

## 2023-12-29 DIAGNOSIS — R410 Disorientation, unspecified: Secondary | ICD-10-CM | POA: Diagnosis present

## 2023-12-29 DIAGNOSIS — E871 Hypo-osmolality and hyponatremia: Secondary | ICD-10-CM | POA: Diagnosis not present

## 2023-12-29 DIAGNOSIS — I071 Rheumatic tricuspid insufficiency: Secondary | ICD-10-CM | POA: Diagnosis present

## 2023-12-29 DIAGNOSIS — I2729 Other secondary pulmonary hypertension: Secondary | ICD-10-CM | POA: Diagnosis present

## 2023-12-29 DIAGNOSIS — Z954 Presence of other heart-valve replacement: Secondary | ICD-10-CM

## 2023-12-29 DIAGNOSIS — D631 Anemia in chronic kidney disease: Secondary | ICD-10-CM | POA: Diagnosis present

## 2023-12-29 DIAGNOSIS — E039 Hypothyroidism, unspecified: Secondary | ICD-10-CM | POA: Diagnosis present

## 2023-12-29 DIAGNOSIS — I4891 Unspecified atrial fibrillation: Secondary | ICD-10-CM | POA: Diagnosis present

## 2023-12-29 DIAGNOSIS — E861 Hypovolemia: Secondary | ICD-10-CM | POA: Diagnosis present

## 2023-12-29 DIAGNOSIS — E875 Hyperkalemia: Secondary | ICD-10-CM | POA: Diagnosis present

## 2023-12-29 DIAGNOSIS — Z96642 Presence of left artificial hip joint: Secondary | ICD-10-CM | POA: Diagnosis present

## 2023-12-29 DIAGNOSIS — Z833 Family history of diabetes mellitus: Secondary | ICD-10-CM

## 2023-12-29 DIAGNOSIS — R7989 Other specified abnormal findings of blood chemistry: Secondary | ICD-10-CM | POA: Diagnosis not present

## 2023-12-29 DIAGNOSIS — Z6823 Body mass index (BMI) 23.0-23.9, adult: Secondary | ICD-10-CM

## 2023-12-29 DIAGNOSIS — K7460 Unspecified cirrhosis of liver: Secondary | ICD-10-CM | POA: Diagnosis present

## 2023-12-29 DIAGNOSIS — R64 Cachexia: Secondary | ICD-10-CM | POA: Diagnosis present

## 2023-12-29 DIAGNOSIS — Z952 Presence of prosthetic heart valve: Secondary | ICD-10-CM

## 2023-12-29 DIAGNOSIS — Z66 Do not resuscitate: Secondary | ICD-10-CM | POA: Diagnosis present

## 2023-12-29 DIAGNOSIS — E43 Unspecified severe protein-calorie malnutrition: Secondary | ICD-10-CM | POA: Diagnosis present

## 2023-12-29 DIAGNOSIS — I50810 Right heart failure, unspecified: Secondary | ICD-10-CM

## 2023-12-29 DIAGNOSIS — Z79890 Hormone replacement therapy (postmenopausal): Secondary | ICD-10-CM

## 2023-12-29 DIAGNOSIS — R188 Other ascites: Secondary | ICD-10-CM | POA: Diagnosis present

## 2023-12-29 DIAGNOSIS — R5381 Other malaise: Secondary | ICD-10-CM | POA: Diagnosis present

## 2023-12-29 DIAGNOSIS — Z79899 Other long term (current) drug therapy: Secondary | ICD-10-CM

## 2023-12-29 DIAGNOSIS — I447 Left bundle-branch block, unspecified: Secondary | ICD-10-CM | POA: Diagnosis present

## 2023-12-29 DIAGNOSIS — L03115 Cellulitis of right lower limb: Secondary | ICD-10-CM | POA: Diagnosis present

## 2023-12-29 DIAGNOSIS — R54 Age-related physical debility: Secondary | ICD-10-CM | POA: Diagnosis present

## 2023-12-29 DIAGNOSIS — Y831 Surgical operation with implant of artificial internal device as the cause of abnormal reaction of the patient, or of later complication, without mention of misadventure at the time of the procedure: Secondary | ICD-10-CM | POA: Diagnosis present

## 2023-12-29 DIAGNOSIS — D649 Anemia, unspecified: Secondary | ICD-10-CM | POA: Diagnosis present

## 2023-12-29 DIAGNOSIS — N183 Chronic kidney disease, stage 3 unspecified: Secondary | ICD-10-CM | POA: Diagnosis present

## 2023-12-29 DIAGNOSIS — Z751 Person awaiting admission to adequate facility elsewhere: Secondary | ICD-10-CM

## 2023-12-29 DIAGNOSIS — M109 Gout, unspecified: Secondary | ICD-10-CM | POA: Diagnosis present

## 2023-12-29 DIAGNOSIS — I130 Hypertensive heart and chronic kidney disease with heart failure and stage 1 through stage 4 chronic kidney disease, or unspecified chronic kidney disease: Secondary | ICD-10-CM | POA: Diagnosis not present

## 2023-12-29 DIAGNOSIS — R040 Epistaxis: Secondary | ICD-10-CM | POA: Diagnosis present

## 2023-12-29 DIAGNOSIS — Z825 Family history of asthma and other chronic lower respiratory diseases: Secondary | ICD-10-CM

## 2023-12-29 DIAGNOSIS — Z8249 Family history of ischemic heart disease and other diseases of the circulatory system: Secondary | ICD-10-CM

## 2023-12-29 DIAGNOSIS — Z515 Encounter for palliative care: Secondary | ICD-10-CM

## 2023-12-29 DIAGNOSIS — I4821 Permanent atrial fibrillation: Secondary | ICD-10-CM | POA: Diagnosis present

## 2023-12-29 DIAGNOSIS — R791 Abnormal coagulation profile: Secondary | ICD-10-CM | POA: Diagnosis not present

## 2023-12-29 DIAGNOSIS — E8809 Other disorders of plasma-protein metabolism, not elsewhere classified: Secondary | ICD-10-CM | POA: Diagnosis present

## 2023-12-29 DIAGNOSIS — N1832 Chronic kidney disease, stage 3b: Secondary | ICD-10-CM | POA: Diagnosis present

## 2023-12-29 DIAGNOSIS — K7581 Nonalcoholic steatohepatitis (NASH): Secondary | ICD-10-CM | POA: Diagnosis present

## 2023-12-29 DIAGNOSIS — I959 Hypotension, unspecified: Secondary | ICD-10-CM | POA: Diagnosis present

## 2023-12-29 DIAGNOSIS — N179 Acute kidney failure, unspecified: Secondary | ICD-10-CM | POA: Diagnosis present

## 2023-12-29 DIAGNOSIS — I5033 Acute on chronic diastolic (congestive) heart failure: Secondary | ICD-10-CM | POA: Diagnosis present

## 2023-12-29 DIAGNOSIS — K7290 Hepatic failure, unspecified without coma: Secondary | ICD-10-CM | POA: Diagnosis present

## 2023-12-29 DIAGNOSIS — Z803 Family history of malignant neoplasm of breast: Secondary | ICD-10-CM

## 2023-12-29 DIAGNOSIS — D696 Thrombocytopenia, unspecified: Secondary | ICD-10-CM | POA: Diagnosis present

## 2023-12-29 DIAGNOSIS — I824Y9 Acute embolism and thrombosis of unspecified deep veins of unspecified proximal lower extremity: Secondary | ICD-10-CM | POA: Diagnosis present

## 2023-12-29 DIAGNOSIS — I5082 Biventricular heart failure: Secondary | ICD-10-CM | POA: Diagnosis present

## 2023-12-29 DIAGNOSIS — I2510 Atherosclerotic heart disease of native coronary artery without angina pectoris: Secondary | ICD-10-CM | POA: Diagnosis present

## 2023-12-29 DIAGNOSIS — Z7901 Long term (current) use of anticoagulants: Secondary | ICD-10-CM

## 2023-12-29 DIAGNOSIS — K7682 Hepatic encephalopathy: Secondary | ICD-10-CM | POA: Diagnosis present

## 2023-12-29 LAB — CBC WITH DIFFERENTIAL/PLATELET
Abs Immature Granulocytes: 0.04 K/uL (ref 0.00–0.07)
Basophils Absolute: 0.1 K/uL (ref 0.0–0.1)
Basophils Relative: 1 %
Eosinophils Absolute: 0.2 K/uL (ref 0.0–0.5)
Eosinophils Relative: 5 %
HCT: 33.5 % — ABNORMAL LOW (ref 36.0–46.0)
Hemoglobin: 11 g/dL — ABNORMAL LOW (ref 12.0–15.0)
Immature Granulocytes: 1 %
Lymphocytes Relative: 9 %
Lymphs Abs: 0.4 K/uL — ABNORMAL LOW (ref 0.7–4.0)
MCH: 26.8 pg (ref 26.0–34.0)
MCHC: 32.8 g/dL (ref 30.0–36.0)
MCV: 81.5 fL (ref 80.0–100.0)
Monocytes Absolute: 0.6 K/uL (ref 0.1–1.0)
Monocytes Relative: 13 %
Neutro Abs: 3 K/uL (ref 1.7–7.7)
Neutrophils Relative %: 71 %
Platelets: 174 K/uL (ref 150–400)
RBC: 4.11 MIL/uL (ref 3.87–5.11)
RDW: 18.3 % — ABNORMAL HIGH (ref 11.5–15.5)
WBC: 4.3 K/uL (ref 4.0–10.5)
nRBC: 0 % (ref 0.0–0.2)

## 2023-12-29 LAB — BASIC METABOLIC PANEL WITH GFR
Anion gap: 12 (ref 5–15)
BUN: 31 mg/dL — ABNORMAL HIGH (ref 8–23)
CO2: 19 mmol/L — ABNORMAL LOW (ref 22–32)
Calcium: 8.8 mg/dL — ABNORMAL LOW (ref 8.9–10.3)
Chloride: 94 mmol/L — ABNORMAL LOW (ref 98–111)
Creatinine, Ser: 1.43 mg/dL — ABNORMAL HIGH (ref 0.44–1.00)
GFR, Estimated: 37 mL/min — ABNORMAL LOW (ref >60)
Glucose, Bld: 75 mg/dL (ref 70–99)
Potassium: 5.2 mmol/L — ABNORMAL HIGH (ref 3.5–5.1)
Sodium: 125 mmol/L — ABNORMAL LOW (ref 135–145)

## 2023-12-29 LAB — I-STAT VENOUS BLOOD GAS, ED
Acid-base deficit: 3 mmol/L — ABNORMAL HIGH (ref 0.0–2.0)
Bicarbonate: 23.2 mmol/L (ref 20.0–28.0)
Calcium, Ion: 1.2 mmol/L (ref 1.15–1.40)
HCT: 38 % (ref 36.0–46.0)
Hemoglobin: 12.9 g/dL (ref 12.0–15.0)
O2 Saturation: 57 %
Potassium: 5.5 mmol/L — ABNORMAL HIGH (ref 3.5–5.1)
Sodium: 127 mmol/L — ABNORMAL LOW (ref 135–145)
TCO2: 25 mmol/L (ref 22–32)
pCO2, Ven: 44.5 mmHg (ref 44–60)
pH, Ven: 7.326 (ref 7.25–7.43)
pO2, Ven: 32 mmHg (ref 32–45)

## 2023-12-29 LAB — COMPREHENSIVE METABOLIC PANEL WITH GFR
ALT: 10 U/L (ref 0–44)
AST: 35 U/L (ref 15–41)
Albumin: 3 g/dL — ABNORMAL LOW (ref 3.5–5.0)
Alkaline Phosphatase: 74 U/L (ref 38–126)
Anion gap: 10 (ref 5–15)
BUN: 32 mg/dL — ABNORMAL HIGH (ref 8–23)
CO2: 21 mmol/L — ABNORMAL LOW (ref 22–32)
Calcium: 9.2 mg/dL (ref 8.9–10.3)
Chloride: 93 mmol/L — ABNORMAL LOW (ref 98–111)
Creatinine, Ser: 1.54 mg/dL — ABNORMAL HIGH (ref 0.44–1.00)
GFR, Estimated: 34 mL/min — ABNORMAL LOW (ref >60)
Glucose, Bld: 83 mg/dL (ref 70–99)
Potassium: 5.5 mmol/L — ABNORMAL HIGH (ref 3.5–5.1)
Sodium: 124 mmol/L — ABNORMAL LOW (ref 135–145)
Total Bilirubin: 2.4 mg/dL — ABNORMAL HIGH (ref 0.0–1.2)
Total Protein: 8.7 g/dL — ABNORMAL HIGH (ref 6.5–8.1)

## 2023-12-29 LAB — URINALYSIS, ROUTINE W REFLEX MICROSCOPIC
Bilirubin Urine: NEGATIVE
Glucose, UA: NEGATIVE mg/dL
Hgb urine dipstick: NEGATIVE
Ketones, ur: NEGATIVE mg/dL
Leukocytes,Ua: NEGATIVE
Nitrite: NEGATIVE
Protein, ur: NEGATIVE mg/dL
Specific Gravity, Urine: 1.009 (ref 1.005–1.030)
pH: 5 (ref 5.0–8.0)

## 2023-12-29 LAB — RETICULOCYTES
Immature Retic Fract: 17.7 % — ABNORMAL HIGH (ref 2.3–15.9)
RBC.: 3.62 MIL/uL — ABNORMAL LOW (ref 3.87–5.11)
Retic Count, Absolute: 51 K/uL (ref 19.0–186.0)
Retic Ct Pct: 1.4 % (ref 0.4–3.1)

## 2023-12-29 LAB — PROTIME-INR
INR: 1.5 — ABNORMAL HIGH (ref 0.8–1.2)
Prothrombin Time: 19 s — ABNORMAL HIGH (ref 11.4–15.2)

## 2023-12-29 LAB — IRON AND TIBC
Iron: 39 ug/dL (ref 28–170)
Saturation Ratios: 11 % (ref 10.4–31.8)
TIBC: 372 ug/dL (ref 250–450)
UIBC: 333 ug/dL

## 2023-12-29 LAB — TROPONIN I (HIGH SENSITIVITY)
Troponin I (High Sensitivity): 12 ng/L (ref <18)
Troponin I (High Sensitivity): 12 ng/L (ref <18)

## 2023-12-29 LAB — FERRITIN: Ferritin: 251 ng/mL (ref 11–307)

## 2023-12-29 LAB — RESP PANEL BY RT-PCR (RSV, FLU A&B, COVID)  RVPGX2
Influenza A by PCR: NEGATIVE
Influenza B by PCR: NEGATIVE
Resp Syncytial Virus by PCR: NEGATIVE
SARS Coronavirus 2 by RT PCR: NEGATIVE

## 2023-12-29 LAB — CREATININE, URINE, RANDOM: Creatinine, Urine: 72 mg/dL

## 2023-12-29 LAB — MAGNESIUM: Magnesium: 2 mg/dL (ref 1.7–2.4)

## 2023-12-29 LAB — AMMONIA: Ammonia: 15 umol/L (ref 9–35)

## 2023-12-29 LAB — T4, FREE: Free T4: 1.26 ng/dL — ABNORMAL HIGH (ref 0.61–1.12)

## 2023-12-29 LAB — TSH: TSH: 19.514 u[IU]/mL — ABNORMAL HIGH (ref 0.350–4.500)

## 2023-12-29 LAB — OSMOLALITY: Osmolality: 285 mosm/kg (ref 275–295)

## 2023-12-29 LAB — BRAIN NATRIURETIC PEPTIDE: B Natriuretic Peptide: 639.2 pg/mL — ABNORMAL HIGH (ref 0.0–100.0)

## 2023-12-29 LAB — SODIUM, URINE, RANDOM: Sodium, Ur: 57 mmol/L

## 2023-12-29 LAB — OSMOLALITY, URINE: Osmolality, Ur: 332 mosm/kg (ref 300–900)

## 2023-12-29 MED ORDER — DOXYCYCLINE HYCLATE 100 MG PO TABS
100.0000 mg | ORAL_TABLET | Freq: Two times a day (BID) | ORAL | Status: DC
Start: 2023-12-30 — End: 2023-12-30
  Administered 2023-12-30 (×2): 100 mg via ORAL
  Filled 2023-12-29 (×2): qty 1

## 2023-12-29 MED ORDER — FUROSEMIDE 10 MG/ML IJ SOLN
60.0000 mg | Freq: Once | INTRAMUSCULAR | Status: AC
  Administered 2023-12-29: 60 mg via INTRAVENOUS
  Filled 2023-12-29: qty 6

## 2023-12-29 MED ORDER — ALBUMIN HUMAN 5 % IV SOLN
12.5000 g | Freq: Once | INTRAVENOUS | Status: AC
  Administered 2023-12-30: 12.5 g via INTRAVENOUS
  Filled 2023-12-29: qty 250

## 2023-12-29 MED ORDER — SODIUM ZIRCONIUM CYCLOSILICATE 5 G PO PACK
5.0000 g | PACK | Freq: Once | ORAL | Status: AC
  Administered 2023-12-29: 5 g via ORAL
  Filled 2023-12-29: qty 1

## 2023-12-29 MED ORDER — HEPARIN (PORCINE) 25000 UT/250ML-% IV SOLN
950.0000 [IU]/h | INTRAVENOUS | Status: DC
  Administered 2023-12-29: 850 [IU]/h via INTRAVENOUS
  Administered 2023-12-31: 950 [IU]/h via INTRAVENOUS
  Filled 2023-12-29 (×2): qty 250

## 2023-12-29 MED ORDER — ONDANSETRON HCL 4 MG/2ML IJ SOLN: 4.0000 mg | Freq: Once | INTRAMUSCULAR | Status: DC

## 2023-12-29 NOTE — Assessment & Plan Note (Signed)
 Mild slight redness of the right lower extremity Started on doxycycline twice daily

## 2023-12-29 NOTE — Progress Notes (Signed)
 PHARMACY - ANTICOAGULATION CONSULT NOTE  Pharmacy Consult for heparin Indication: mechanical mitral valve and subtherapeutic INR on warfarin  No Known Allergies  Patient Measurements: Height: 5' 1 (154.9 cm) Weight: 63.5 kg (140 lb) IBW/kg (Calculated) : 47.8 HEPARIN DW (KG): 60.9  Vital Signs: Temp: 97.4 F (36.3 C) (10/09 1904) Temp Source: Oral (10/09 1904) BP: 144/98 (10/09 1845) Pulse Rate: 79 (10/09 1845)  Labs: Recent Labs    12/29/23 1537 12/29/23 1550 12/29/23 1828  HGB 11.0* 12.9  --   HCT 33.5* 38.0  --   PLT 174  --   --   LABPROT 19.0*  --   --   INR 1.5*  --   --   CREATININE 1.54*  --   --   TROPONINIHS 12  --  12   Estimated Creatinine Clearance: 25.3 mL/min (A) (by C-G formula based on SCr of 1.54 mg/dL (H)).  Medical History: Past Medical History:  Diagnosis Date   Atrial fibrillation (HCC)    Heart disease    aortic and mitral valve problems   Hepatic cirrhosis (HCC)    Hypothyroidism     Medications:  Scheduled:   ondansetron (ZOFRAN) IV  4 mg Intravenous Once  Infusions:   Assessment: Patient is a 78 YO F presenting with abdominal distension, peripheral edema, and fatigue. 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 According to last anticoag clinic note on 9/25, warfarin regimen was one-half 5 mg tablet daily and INR was 3 at that time.   Initial INR subtherapeutic at 1.5; cardiology recommending bridge with heparin drip without bolus until the INR reaches at least 2.0. Pharmacy consulted to dose heparin drip. Initial Hgb and platelets WNL.   Goal of Therapy:  Heparin level 0.3-0.7 units/ml Monitor platelets by anticoagulation protocol: Yes   Plan:  -Start heparin infusion at 850 units/h.  -Check 1st heparin level with AM labs. -Monitor heparin levels, CBC, INR daily.   Maurilio Patten, PharmD PGY1 Pharmacy Resident [facility] 12/29/2023 8:39 PM

## 2023-12-29 NOTE — Assessment & Plan Note (Signed)
 Noted somewhat fluid overloaded.  Again it is unclear if this is due to cirrhosis versus cardiac etiology.  Appreciate GI consult patient may also benefit from cardiology follow-up will repeat echogram to further evaluate patient received 1 dose of Lasix in the emergency department with diuresis blood pressure stable.  Observe renal function post diuresis and then decide if patient can tolerate more diuresis especially if large-volume paracentesis was to be attempted would benefit from albumin is this may help with third spacing

## 2023-12-29 NOTE — Assessment & Plan Note (Signed)
 Currently anticoagulated with heparin restart Toprol  if able to tolerate blood pressure wise

## 2023-12-29 NOTE — Assessment & Plan Note (Signed)
-   Check TSH continue home medications Synthroid at 125 mcg po q day Patient states that she has been coming more fatigued when her thyroid medication dose is decreased.  Of note TSH is significantly elevated but T4 is also somewhat elevated await results of T3 patient will likely benefit from follow-up with endocrinology as an outpatient

## 2023-12-29 NOTE — Assessment & Plan Note (Signed)
 Obtain anemia panel  Transfuse for Hg <7 , rapidly dropping or  if symptomatic

## 2023-12-29 NOTE — Assessment & Plan Note (Signed)
 History of chronic hyponatremia but currently below baseline usually patient's sodium is closer to 130 Patient has been having more general fatigue and confusion for the past few months has been chronic and ongoing Fluid status wise she appears to have anasarca but hard to determine if intravascular status is somewhat deceptive obtain urine electrolytes

## 2023-12-29 NOTE — Assessment & Plan Note (Signed)
-  chronic avoid nephrotoxic medications such as NSAIDs, Vanco Zosyn combo,  avoid hypotension, continue to follow renal function

## 2023-12-29 NOTE — Assessment & Plan Note (Signed)
 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  Will email them

## 2023-12-29 NOTE — ED Triage Notes (Signed)
 Pt BIB [facility] from home c/o confusion, fatigue, and swelling. Pt is on Torsemide  and Coumadin  r/t valve replacement. A/Ox4 for EMS and vitally stable. Pt with known non-alcoholic cirrhosis and notably distended abdomen.

## 2023-12-29 NOTE — Assessment & Plan Note (Signed)
 Family states that patient for the past few months has been progressively getting more confused but they actually continuing that today after being in the emergency department she is actually looking somewhat better more alert and oriented.  Will continue to observe neurologically intact no asterixis ammonia level within normal limits

## 2023-12-29 NOTE — H&P (Signed)
 Chelsea Boyer FMW:[id number] DOB: 14-Mar-1946 DOA: 12/29/2023     PCP: Chrystal Lamarr RAMAN, MD   Outpatient Specialists:  CARDS:   Dr. Jerel Balding, MD  NEphrology:  Dr.  Gearline    GI Zehr, Harlene BIRCH, PA-C (  [facility])    Patient arrived to ER on 12/29/23 at 1439 Referred by Attending Pamella Ozell LABOR, DO   Patient coming from:    home Lives   With family     Chief Complaint:   Chief Complaint  Patient presents with   Altered Mental Status   Fatigue    HPI: Chelsea Boyer is a 78 y.o. female with medical history significant of NASH small esophageal varices , A.fib on coumadin  , , hx of mitral valve replacement on coumadin  , CKD, hypothyrodism, anemia, sp TAVR    Presented with abd distention Came in with increased confusion fatigued, increased leg and abd swelling. Hx of NASH wit cirrhosis Confusion worsening over past 1 m Hx of valve replacement on coumadin  Has been taking Torsemide  Has had poor appetite for the past week, increased confusion progressively  She gets up for 2 hours and foes back to bed and sleeps most of the time  Her thyroid medication was changed recently it was decreased    Her diuretic have been managed by nephrology  on torsemide  40 mg daily and spironolactone  50 mg daily.  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 Left ventricular ejection fraction, by estimation, is 50 to 55%.  The left ventricle has low normal function. The left ventricle has no regional wall motion abnormalities. Left ventricular diastolic function could not be evaluated.  2. Right ventricular systolic function is moderately reduced. The right ventricular size is mildly enlarged.  3. The mitral valve is normal in structure. No evidence of mitral valve regurgitation. No evidence of mitral stenosis.  4. Tricuspid valve regurgitation is severe.  5. The aortic valve has been repaired/replaced. Aortic valve regurgitation is mild to moderate. Possible stenosis of the bioprosthetic valve. There is a 23 mm Sapien prosthetic (TAVR) valve present in the aortic position. Aortic regurgitation PHT  measures 403 msec. Aortic valve mean gradient measures 23.5 mmHg. Aortic valve Vmax measures 3.21 m/s.  6. The inferior vena cava is normal in size with greater than 50% respiratory variability, suggesting right atrial pressure of 3 mmHg.            Hypothyroidism on synthroid now down to 125      A. Fib -   atrial fibrillation CHA2DS2 vas score 5     On metoprol  current  on anticoagulation with  Coumadin       CKD stage IIIb baseline Cr 1.5  Lab Results  Component Value Date   CREATININE 1.54 (H) 12/29/2023   CREATININE 1.78 (H) 08/09/2023   CREATININE 1.48 (H) 01/11/2023   Lab Results  Component Value Date   NA 127 (L) 12/29/2023   CL 93 (L) 12/29/2023   K 5.5 (H) 12/29/2023   CO2 21 (L) 12/29/2023   BUN 32 (H) 12/29/2023   CREATININE 1.54 (H) 12/29/2023   GFRNONAA 34 (L) 12/29/2023   CALCIUM 9.2 12/29/2023   ALBUMIN 3.0 (L) 12/29/2023   GLUCOSE 83 12/29/2023      Liver disease MELD 3.0: 26 at 12/29/2023  3:50 PM MELD-Na: 25 at 12/29/2023  3:50 PM Calculated from: Serum Creatinine: 1.54 mg/dL at 89/0/7974  6:62 PM Serum Sodium: 127 mmol/L at 12/29/2023  3:50 PM Total Bilirubin: 2.4 mg/dL at 89/0/7974  6:62 PM Serum Albumin: 3 g/dL at 89/0/7974  6:62 PM INR(ratio): 1.5 at 12/29/2023  3:37  PM Age at listing (hypothetical): 78 years Sex: Female at 12/29/2023  3:50 PM  Hepatic Function Panel     Component Value Date/Time   PROT 8.7 (H) 12/29/2023 1537   PROT 7.7 06/09/2020 1547   ALBUMIN 3.0 (L) 12/29/2023 1537   ALBUMIN 4.1 06/09/2020 1547   AST 35 12/29/2023 1537   ALT 10 12/29/2023 1537   ALKPHOS 74 12/29/2023 1537   BILITOT 2.4 (H) 12/29/2023 1537   BILITOT 0.8 06/09/2020 1547   1.5    Chronic anemia - baseline hg Hemoglobin & Hematocrit  Recent Labs    08/09/23 1048 12/29/23 1537 12/29/23 1550  HGB 11.3* 11.0* 12.9   Iron/TIBC/Ferritin/ %Sat No results found for: IRON, TIBC, FERRITIN, IRONPCTSAT    While in ER:     Was given a dose of Lasix Found to have subtherapeutic INR 1.6 Cardiology was consulted recommended starting heparin  Lab Orders         Resp panel by RT-PCR (RSV, Flu A&B, Covid) Anterior Nasal Swab         CBC with Differential         Comprehensive metabolic panel         Magnesium         Ammonia         Urinalysis, Routine w reflex microscopic -Urine, Clean Catch         Brain natriuretic peptide         Protime-INR         I-Stat venous blood gas, ([facility], [facility], [facility])      CT HEAD No acute intracranial abnormality. 2. 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      CXR -  NON acute  CTabd/pelvis - Hepatic cirrhosis with large volume abdominopelvic ascites.    Following Medications were ordered in ER: Medications  ondansetron (ZOFRAN) injection 4 mg (4 mg Intravenous Not Given 12/29/23 1635)  sodium zirconium cyclosilicate (LOKELMA) packet 5 g (has no administration in time range)  furosemide (LASIX) injection 60 mg (60 mg Intravenous Given 12/29/23 1827)    _______________________________________________________ ER Provider Called:    Cardiology    DrGOMEZ  They Recommend admit to medicine   Start heparin drip    ED Triage Vitals  Encounter Vitals Group     BP 12/29/23 1600 (!)  111/57     Girls Systolic BP Percentile --      Girls Diastolic BP Percentile --      Boys Systolic BP Percentile --      Boys Diastolic BP Percentile --  Pulse Rate 12/29/23 1600 61     Resp 12/29/23 1600 15     Temp 12/29/23 1442 97.6 F (36.4 C)     Temp Source 12/29/23 1442 Oral     SpO2 12/29/23 1600 100 %     Weight 12/29/23 1452 140 lb (63.5 kg)     Height 12/29/23 1452 5' 1 (1.549 m)     Head Circumference --      Peak Flow --      Pain Score 12/29/23 1452 0     Pain Loc --      Pain Education --      Exclude from Growth Chart --   UFJK(75)@     _________________________________________ Significant initial  Findings: Abnormal Labs Reviewed  CBC WITH DIFFERENTIAL/PLATELET - Abnormal; Notable for the following components:      Result Value   Hemoglobin 11.0 (*)    HCT 33.5 (*)    RDW 18.3 (*)    Lymphs Abs 0.4 (*)    All other components within normal limits  COMPREHENSIVE METABOLIC PANEL WITH GFR - Abnormal; Notable for the following components:   Sodium 124 (*)    Potassium 5.5 (*)    Chloride 93 (*)    CO2 21 (*)    BUN 32 (*)    Creatinine, Ser 1.54 (*)    Total Protein 8.7 (*)    Albumin 3.0 (*)    Total Bilirubin 2.4 (*)    GFR, Estimated 34 (*)    All other components within normal limits  BRAIN NATRIURETIC PEPTIDE - Abnormal; Notable for the following components:   B Natriuretic Peptide 639.2 (*)    All other components within normal limits  PROTIME-INR - Abnormal; Notable for the following components:   Prothrombin Time 19.0 (*)    INR 1.5 (*)    All other components within normal limits  I-STAT VENOUS BLOOD GAS, ED - Abnormal; Notable for the following components:   Acid-base deficit 3.0 (*)    Sodium 127 (*)    Potassium 5.5 (*)    All other components within normal limits      _________________________ Troponin  ordered Cardiac Panel (last 3 results) Recent Labs    12/29/23 1537 12/29/23 1828  TROPONINIHS 12 12     ECG:  Ordered Personally reviewed and interpreted by me showing: HR : 62 Rhythm: Right and left arm electrode reversal, interpretation assumes no reversal Atrial fibrillation Nonspecific intraventricular conduction delay Consider anterior infarct Abnormal T, consider ischemia, lateral leads QTC 484  BNP (last 3 results) Recent Labs    12/29/23 1537  BNP 639.2*       The recent clinical data is shown below. Vitals:   12/29/23 1600 12/29/23 1830 12/29/23 1845 12/29/23 1904  BP: (!) 111/57 126/62 (!) 144/98   Pulse: 61 61 79   Resp: 15 19 16    Temp:    (!) 97.4 F (36.3 C)  TempSrc:    Oral  SpO2: 100% 100% 100%   Weight:      Height:         WBC     Component Value Date/Time   WBC 4.3 12/29/2023 1537   LYMPHSABS 0.4 (L) 12/29/2023 1537   LYMPHSABS 1.0 06/09/2020 1547   MONOABS 0.6 12/29/2023 1537   EOSABS 0.2 12/29/2023 1537   EOSABS 0.4 06/09/2020 1547   BASOSABS 0.1 12/29/2023 1537   BASOSABS 0.1 06/09/2020 1547         UA   no  evidence of UTI     Urine analysis:    Component Value Date/Time   COLORURINE YELLOW 12/29/2023 1900   APPEARANCEUR CLEAR 12/29/2023 1900   LABSPEC 1.009 12/29/2023 1900   PHURINE 5.0 12/29/2023 1900   GLUCOSEU NEGATIVE 12/29/2023 1900   HGBUR NEGATIVE 12/29/2023 1900   BILIRUBINUR NEGATIVE 12/29/2023 1900   KETONESUR NEGATIVE 12/29/2023 1900   PROTEINUR NEGATIVE 12/29/2023 1900   NITRITE NEGATIVE 12/29/2023 1900   LEUKOCYTESUR NEGATIVE 12/29/2023 1900    Results for orders placed or performed during the hospital encounter of 12/29/23  Resp panel by RT-PCR (RSV, Flu A&B, Covid) Anterior Nasal Swab     Status: None   Collection Time: 12/29/23  3:37 PM   Specimen: Anterior Nasal Swab  Result Value Ref Range Status   SARS Coronavirus 2 by RT PCR NEGATIVE NEGATIVE Final   Influenza A by PCR NEGATIVE NEGATIVE Final   Influenza B by PCR NEGATIVE NEGATIVE Final         Resp Syncytial Virus by PCR NEGATIVE NEGATIVE Final           ________________________________________________________________  Venous  Blood Gas result:  pH  7.326 Sodium 127 Low  mmol/L   pCO2, Ven 44.5 mmHg Potassium 5.5 High  mmol/L  pO2, Ven 32 mmHg       __________________________________________________________ Recent Labs  Lab 12/29/23 1537 12/29/23 1550  NA 124* 127*  K 5.5* 5.5*  CO2 21*  --   GLUCOSE 83  --   BUN 32*  --   CREATININE 1.54*  --   CALCIUM 9.2  --   MG 2.0  --     Cr stable,    Lab Results  Component Value Date   CREATININE 1.54 (H) 12/29/2023   CREATININE 1.78 (H) 08/09/2023   CREATININE 1.48 (H) 01/11/2023    Recent Labs  Lab 12/29/23 1537  AST 35  ALT 10  ALKPHOS 74  BILITOT 2.4*  PROT 8.7*  ALBUMIN 3.0*   Lab Results  Component Value Date   CALCIUM 9.2 12/29/2023          Plt: Lab Results  Component Value Date   PLT 174 12/29/2023         Recent Labs  Lab 12/29/23 1537 12/29/23 1550  WBC 4.3  --   NEUTROABS 3.0  --   HGB 11.0* 12.9  HCT 33.5* 38.0  MCV 81.5  --   PLT 174  --     HG/HCT stable,       Component Value Date/Time   HGB 12.9 12/29/2023 1550   HGB 11.8 06/09/2020 1547   HCT 38.0 12/29/2023 1550   HCT 36.9 06/09/2020 1547   MCV 81.5 12/29/2023 1537   MCV 91 06/09/2020 1547     No results for input(s): LIPASE, AMYLASE in the last 168 hours. Recent Labs  Lab 12/29/23 1537  AMMONIA 15      _______________________________________________ Hospitalist was called for admission for fluid overload   The following Work up has been ordered so far:  Orders Placed This Encounter  Procedures   Critical Care   Resp panel by RT-PCR (RSV, Flu A&B, Covid) Anterior Nasal Swab   CT Head Wo Contrast   DG Chest Port 1 View   CT ABDOMEN PELVIS WO CONTRAST   CBC with Differential   Comprehensive metabolic panel   Magnesium   Ammonia   Urinalysis, Routine w reflex microscopic -Urine, Clean Catch   Brain natriuretic peptide   Protime-INR  Consult to  hospitalist   Airborne and Contact precautions   I-Stat venous blood gas, ([facility], [facility], [facility])   ED EKG   EKG 12-Lead     OTHER Significant initial  Findings:  labs showing:     DM  labs:  HbA1C: No results for input(s): HGBA1C in the last 8760 hours.     CBG (last 3)  No results for input(s): GLUCAP in the last 72 hours.        Cultures: No results found for: SDES, SPECREQUEST, CULT, REPTSTATUS   Radiological Exams on Admission: CT ABDOMEN PELVIS WO CONTRAST Result Date: 12/29/2023 CLINICAL DATA:  Acute abdominal pain.  Fatigue.  Swelling. EXAM: CT ABDOMEN AND PELVIS WITHOUT CONTRAST TECHNIQUE: Multidetector CT imaging of the abdomen and pelvis was performed following the standard protocol without IV contrast. RADIATION DOSE REDUCTION: This exam was performed according to the departmental dose-optimization program which includes automated exposure control, adjustment of the mA and/or kV according to patient size and/or use of iterative reconstruction technique. COMPARISON:  Abdominal ultrasound 08/18/2023 FINDINGS: Lower chest: Small left and trace right pleural effusion. The heart is enlarged. Circumferential left atrial calcifications. Mitral valve replacement, aortic valve replacement. Hepatobiliary: Hepatic cirrhosis with nodular contours. No obvious focal liver lesion on this unenhanced exam. Calcified gallstones within decompressed gallbladder. Pancreas: No ductal dilatation or inflammation. Spleen: No splenomegaly. The spleen is small in size measuring 7.8 cm in length. Adrenals/Urinary Tract: No adrenal nodule. Bilateral renal parenchymal thinning. No hydronephrosis. No renal calculi. Urinary bladder is only minimally distended. Stomach/Bowel: Bowel assessment is limited due to the presence of ascites. The stomach is nondistended. No small bowel obstruction or abnormal distention. The appendix is not confidently visualized. Small volume of formed stool in the colon. Short  segment of transverse colon extends into a umbilical hernia. No associated wall thickening or obstruction. Sigmoid colonic diverticulosis without diverticulitis. Vascular/Lymphatic: Aortic atherosclerosis. No aortic aneurysm. Suspected retroperitoneal collaterals. Ascites limits assessment for adenopathy, no gross bulky enlarged nodes. Reproductive: Uterus and bilateral adnexa are unremarkable. Other: Large volume abdominopelvic ascites. Ascites extends into an umbilical hernia. Ascitic fluid appears simple. There is generalized body wall edema. No free air. Musculoskeletal: Left hip arthroplasty. Lower lumbar facet hypertrophy. IMPRESSION: 1. Hepatic cirrhosis with large volume abdominopelvic ascites. 2. Cholelithiasis. 3. Short segment of transverse colon extends into a umbilical hernia. No associated wall thickening or obstruction. 4. Sigmoid colonic diverticulosis without diverticulitis. 5. Small left and trace right pleural effusions. Generalized body wall edema. Aortic Atherosclerosis (ICD10-I70.0). Electronically Signed   By: Andrea Gasman M.D.   On: 12/29/2023 17:24   CT Head Wo Contrast Result Date: 12/29/2023 EXAM: CT HEAD WITHOUT CONTRAST 12/29/2023 04:24:56 PM TECHNIQUE: CT of the head was performed without the administration of intravenous contrast. Automated exposure control, iterative reconstruction, and/or weight based adjustment of the mA/kV was utilized to reduce the radiation dose to as low as reasonably achievable. COMPARISON: None available. CLINICAL HISTORY: Mental status change, unknown cause. Table formatting from the original note was not included.; Pt BIB [facility] from home c/o confusion, fatigue, and swelling. FINDINGS: BRAIN AND VENTRICLES: No acute hemorrhage. No evidence of acute infarct. No hydrocephalus. No extra-axial collection. No mass effect or midline shift. Remote right cerebellar infarct. Patchy white matter hyperintensities, compatible with chronic small vessel ischemic  change. ORBITS: No acute abnormality. SINUSES: No acute abnormality. SOFT TISSUES AND SKULL: No acute soft tissue abnormality. No skull fracture. IMPRESSION: 1. No acute intracranial abnormality. 2. 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 Electronically signed by: Gilmore Molt MD 12/29/2023 05:11 PM EDT RP Workstation: [id number]   DG Chest Port 1 View Result Date: 12/29/2023 CLINICAL DATA:  Altered mental status. EXAM: PORTABLE CHEST 1 VIEW COMPARISON:  Aug 12, 2020. FINDINGS: Stable cardiomegaly. Sternotomy wires are noted. Status post transcatheter aortic valve repair. No acute pulmonary disease is noted. Bony thorax is unremarkable. IMPRESSION: No acute abnormality seen. Electronically Signed   By: Lynwood Landy Raddle M.D.   On: 12/29/2023 16:58   _______________________________________________________________________________________________________ Latest  Blood pressure (!) 144/98, pulse 79, temperature (!) 97.4 F (36.3 C), temperature source Oral, resp. rate 16, height 5' 1 (1.549 m), weight 63.5 kg, SpO2 100%.   Vitals  labs and radiology finding personally reviewed  Review of Systems:    Pertinent positives include: Deconditioning confusion increased abdominal swelling and leg edema  Constitutional:  No weight loss, night sweats, Fevers, chills, fatigue, weight loss  HEENT:  No headaches, Difficulty swallowing,Tooth/dental problems,Sore throat,  No sneezing, itching, ear ache, nasal congestion, post nasal drip,  Cardio-vascular:  No chest pain, Orthopnea, PND, anasarca, dizziness, palpitations.    GI:  No heartburn, indigestion,  nausea, vomiting, diarrhea, change in bowel habits, loss of appetite, melena, blood in stool, hematemesis Resp:  no shortness of breath at rest. No dyspnea on exertion, No excess mucus, no productive cough, No non-productive cough, No coughing up of blood.No change in color of mucus.No wheezing. Skin:  no rash or  lesions. No jaundice GU:  no dysuria, change in color of urine, no urgency or frequency. No straining to urinate.  No flank pain.  Musculoskeletal:  No joint pain or no joint swelling. No decreased range of motion. No back pain.  Psych:  No change in mood or affect. No depression or anxiety. No memory loss.  Neuro: no localizing neurological complaints, no tingling, no weakness, no double vision, no gait abnormality, no slurred speech,   All systems reviewed and apart from HOPI all are negative _______________________________________________________________________________________________ Past Medical History:   Past Medical History:  Diagnosis Date   Atrial fibrillation (HCC)    Heart disease    aortic and mitral valve problems   Hepatic cirrhosis (HCC)    Hypothyroidism       Past Surgical History:  Procedure Laterality Date   ANKLE SURGERY  1998   AORTIC VALVE REPLACEMENT  2018   CESAREAN SECTION     MITRAL VALVE REPLACEMENT     x 2   TOTAL HIP ARTHROPLASTY Left 2014    Social History:  Ambulatory   independently       reports that she has never smoked. She has never used smokeless tobacco. She reports current alcohol use. She reports that she does not currently use drugs.     Family History:   Family History  Problem Relation Age of Onset   Rheumatic fever Mother    Heart disease Mother 22   Emphysema Brother    Heart attack Brother    Breast cancer Maternal Grandmother    Heart disease Brother    Diabetes Brother    ______________________________________________________________________________________________ Allergies: No Known Allergies   Prior to Admission medications   Medication Sig Start Date End Date Taking? Authorizing Provider  alendronate (FOSAMAX) 70 MG tablet Take 70 mg by mouth once a week. 05/14/21   [provider]  amoxicillin  (AMOXIL ) 500 MG capsule TAKE 4 TABLETS (2000 MG) PRIOR TO THE DENTAL VISIT 01/26/22   Croitoru,  Jerel, MD  Cholecalciferol (VITAMIN D3)  50 MCG (2000 UT) TABS Take 6,000 Units by mouth daily.    [provider]  fluticasone (FLONASE) 50 MCG/ACT nasal spray Place 1 spray into both nostrils daily. 12/03/21   [provider]  levothyroxine (SYNTHROID) 125 MCG tablet Take 150 mcg by mouth every morning. 11/25/22   [provider]  metoprolol  succinate (TOPROL -XL) 50 MG 24 hr tablet TAKE 1 TABLET BY MOUTH EVERY DAY WITH OR IMMEDIATELY FOLLOWING A MEAL 01/19/23   Croitoru, Mihai, MD  spironolactone  (ALDACTONE ) 25 MG tablet Take 1 tablet (25 mg total) by mouth every other day. Patient taking differently: Take 50 mg by mouth daily. 10/31/20   Croitoru, Mihai, MD  torsemide  (DEMADEX ) 20 MG tablet TAKE 1 TABLET BY MOUTH DAILY. FOR A WEIGHT OVER 160 LBS, TAKE 40 MG ONCE DAILY. Patient taking differently: Take 40 mg by mouth daily. 04/14/23   Croitoru, Mihai, MD  warfarin (COUMADIN ) 5 MG tablet TAKE 1/2 TO 1 TABLET BY MOUTH DAILY AS DIRECTED BY COUMADIN  CLINIC 10/12/23   Croitoru, Jerel, MD    ___________________________________________________________________________________________________ Physical Exam:    12/29/2023    6:45 PM 12/29/2023    6:30 PM 12/29/2023    4:00 PM  Vitals with BMI  Systolic 144 126 888  Diastolic 98 62 57  Pulse 79 61 61     1. General:  in No  Acute distress   Chronically ill  -appearing 2. Psychological: Alert and   Oriented to self and situation 3. Head/ENT:    Dry Mucous Membranes                          Head Non traumatic, neck supple                          Poor Dentition 4. SKIN:    decreased Skin turgor,  Skin clean Dry and intact no rash    5. Heart: Regular rate and rhythm  Murmur, no Rub or gallop 6. Lungs: no wheezes or crackles   7. Abdomen: Soft,  non-tender,   distended  bowel sounds present 8. Lower extremities: no clubbing, cyanosis,1+ edema right leg with redness 9. Neurologically Grossly intact, moving all 4 extremities  equally no asterixis 10. MSK: Normal range of motion    Chart has been reviewed  ______________________________________________________________________________________________  Assessment/Plan 78 y.o. female with medical history significant of NASH small esophageal varices , A.fib on coumadin  , , hx of mitral valve replacement on coumadin  , CKD, hypothyrodism, anemia, sp TAVR  Admitted for worsening fluid overload in the setting of cirrhosis   Present on Admission:  Hyponatremia  Acute venous embolism and thrombosis of deep vessels of proximal lower extremity (HCC)  Anemia  Atrial fibrillation (HCC)  Cirrhosis of liver with ascites (HCC)  Other ascites  Cellulitis of right leg  Physical deconditioning  Hypothyroidism  CKD (chronic kidney disease), stage III (HCC)  Confusion  Acute on chronic diastolic CHF (congestive heart failure) (HCC)  Hyperkalemia     Acute venous embolism and thrombosis of deep vessels of proximal lower extremity (HCC) Pt denies hx of dvt  Anemia Obtain anemia panel  Transfuse for Hg <7 , rapidly dropping or  if symptomatic   Atrial fibrillation (HCC) Currently anticoagulated with heparin restart Toprol  if able to tolerate blood pressure wise  Cirrhosis of liver with ascites (HCC) Appreciate [facility] consult patient with significant ascites which is making it difficult for her  to move and has become significantly uncomfortable.  Patient in the past has refused paracentesis because it would require for her to come off of anticoagulation she is currently subtherapeutic on her INR and on heparin drip discussed with patient that if she wants to have large-volume paracentesis done which can be done while she is being on heparin for symptomatic relief patient is agreeable order IR paracentesis may help to administer albumin given known history of CKD  Hyponatremia History of chronic hyponatremia but currently below baseline usually patient's sodium is closer  to 130 Patient has been having more general fatigue and confusion for the past few months has been chronic and ongoing Fluid status wise she appears to have anasarca but hard to determine if intravascular status is somewhat deceptive obtain urine electrolytes  Other ascites Order IR paracentesis and studies  Cellulitis of right leg Mild slight redness of the right lower extremity Started on doxycycline twice daily  Physical deconditioning Pt ot eval   Hypothyroidism - Check TSH continue home medications Synthroid at 125 mcg po q day Patient states that she has been coming more fatigued when her thyroid medication dose is decreased.  Of note TSH is significantly elevated but T4 is also somewhat elevated await results of T3 patient will likely benefit from follow-up with endocrinology as an outpatient  CKD (chronic kidney disease), stage III (HCC)  -chronic avoid nephrotoxic medications such as NSAIDs, Vanco Zosyn combo,  avoid hypotension, continue to follow renal function   Confusion Family states that patient for the past few months has been progressively getting more confused but they actually continuing that today after being in the emergency department she is actually looking somewhat better more alert and oriented.  Will continue to observe neurologically intact no asterixis ammonia level within normal limits  Acute on chronic diastolic CHF (congestive heart failure) (HCC) Noted somewhat fluid overloaded.  Again it is unclear if this is due to cirrhosis versus cardiac etiology.  Appreciate GI consult patient may also benefit from cardiology follow-up will repeat echogram to further evaluate patient received 1 dose of Lasix in the emergency department with diuresis blood pressure stable.  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  Will email them  Hyperkalemia Mild no EKG changes will hold spironolactone    Other plan as per orders.  DVT prophylaxis:  heparin   Code Status:    Code Status: Not on file FULL CODE  as per patient   I had personally discussed CODE STATUS with patient and family  ACP   none    Family Communication:   Family   at  Bedside  plan of care was discussed with  Husband    Diet n.p.o. postmidnight  Disposition Plan:      To home once workup is complete and patient is stable   Following barriers for discharge:  Electrolytes corrected                                                           Will need consultants to evaluate patient prior to discharge                    Consult Orders  (From admission, onward)           Start     Ordered   12/29/23 1818  Consult to hospitalist  Once       Provider:  (Not yet assigned)  Question Answer Comment  Place call to: [facility]   Reason for Consult Admit      12/29/23 1817                               Would benefit from PT/OT eval prior to DC  Ordered                   Consults called:  Cardiology is aware [facility] is aware  Admission status:  ED Disposition     ED Disposition  Admit   Condition  --   Comment  Hospital Area: [facility] [[id number]]  Level of Care: Progressive [102]  Admit to Progressive based on following criteria: NEPHROLOGY stable condition  requiring close monitoring for AKI, requiring Hemodialysis or Peritoneal Dialysis either from expected electrolyte imbalance, acidosis, or fluid overload that can be managed by NIPPV or high flow oxygen.  May place patient in observation at [facility] or [facility] if equivalent level of care is available:: No  Covid Evaluation: Asymptomatic - no recent exposure (last 10 days) testing not required  Diagnosis: Hyponatremia [801480]  Admitting Physician: Taneesha Edgin [[id number]]  Attending Physician: Arlys Scatena [[id number]]  For patients discharging to extended facilities (i.e. SNF, AL, group homes or LTAC) initiate:: Discharge to SNF/Facility Placement COVID-19 Lab Testing Protocol          Obs      Level of care      progressive       Apphia Cropley 12/30/2023, 12:01 AM    [facility]     after 2 AM please page floor coverage   If 7AM-7PM, please contact the day team taking care of the patient using [email or web address]

## 2023-12-29 NOTE — Subjective & Objective (Signed)
 Came in with increased confusion fatigued, increased leg and abd swelling. Hx of NASH wit cirrhosis Confusion worsening over past 1 m Hx of valve replacement on coumadin  Has been taking Torsemide 

## 2023-12-29 NOTE — Assessment & Plan Note (Signed)
 Pt denies hx of dvt

## 2023-12-29 NOTE — Assessment & Plan Note (Signed)
 Order IR paracentesis and studies

## 2023-12-29 NOTE — Assessment & Plan Note (Signed)
 Appreciate [facility] consult patient with significant ascites which is making it difficult for her to move and has become significantly uncomfortable.  Patient in the past has refused paracentesis because it would require for her to come off of anticoagulation she is currently subtherapeutic on her INR and on heparin drip discussed with patient that if she wants to have large-volume paracentesis done which can be done while she is being on heparin for symptomatic relief patient is agreeable order IR paracentesis may help to administer albumin given known history of CKD

## 2023-12-29 NOTE — ED Provider Notes (Addendum)
 [place] [facility] AT [facility] Provider Note   CSN: [id number] Arrival date & time: 12/29/23  1439     Patient presents with: Altered Mental Status and Fatigue   Chelsea Boyer is a 78 y.o. female.   78 year old female with past medical history significant for cirrhosis, CHF presents today for concern of fatigue, confusion according to her daughter over the past month with dates and repeating conversations that have already occurred.  History of valve replacement currently on Coumadin .  Worsening abdominal distention.  The history is provided by the patient. No language interpreter was used.       Prior to Admission medications   Medication Sig Start Date End Date Taking? Authorizing Provider  alendronate (FOSAMAX) 70 MG tablet Take 70 mg by mouth once a week. 05/14/21   [provider]  amoxicillin  (AMOXIL ) 500 MG capsule TAKE 4 TABLETS (2000 MG) PRIOR TO THE DENTAL VISIT 01/26/22   Croitoru, Jerel, MD  Cholecalciferol (VITAMIN D3) 50 MCG (2000 UT) TABS Take 6,000 Units by mouth daily.    [provider]  fluticasone (FLONASE) 50 MCG/ACT nasal spray Place 1 spray into both nostrils daily. 12/03/21   [provider]  levothyroxine (SYNTHROID) 125 MCG tablet Take 150 mcg by mouth every morning. 11/25/22   [provider]  metoprolol  succinate (TOPROL -XL) 50 MG 24 hr tablet TAKE 1 TABLET BY MOUTH EVERY DAY WITH OR IMMEDIATELY FOLLOWING A MEAL 01/19/23   Croitoru, Mihai, MD  spironolactone  (ALDACTONE ) 25 MG tablet Take 1 tablet (25 mg total) by mouth every other day. Patient taking differently: Take 50 mg by mouth daily. 10/31/20   Croitoru, Mihai, MD  torsemide  (DEMADEX ) 20 MG tablet TAKE 1 TABLET BY MOUTH DAILY. FOR A WEIGHT OVER 160 LBS, TAKE 40 MG ONCE DAILY. Patient taking differently: Take 40 mg by mouth daily. 04/14/23   Croitoru, Mihai, MD  warfarin (COUMADIN ) 5 MG tablet TAKE 1/2 TO 1 TABLET BY MOUTH DAILY AS DIRECTED BY  COUMADIN  CLINIC 10/12/23   Croitoru, Jerel, MD    Allergies: Patient has no known allergies.    Review of Systems  Constitutional:  Negative for chills and fever.  Respiratory:  Negative for shortness of breath.   Cardiovascular:  Negative for chest pain.  Gastrointestinal:  Positive for abdominal distention. Negative for abdominal pain.  Genitourinary:  Negative for dysuria.  Neurological:  Negative for light-headedness.  All other systems reviewed and are negative.   Updated Vital Signs Temp 97.6 F (36.4 C) (Oral)   Ht 5' 1 (1.549 m)   Wt 63.5 kg   BMI 26.45 kg/m   Physical Exam Vitals and nursing note reviewed.  Constitutional:      General: She is not in acute distress.    Appearance: Normal appearance. She is not ill-appearing.  HENT:     Head: Normocephalic and atraumatic.     Nose: Nose normal.  Eyes:     Conjunctiva/sclera: Conjunctivae normal.  Cardiovascular:     Rate and Rhythm: Normal rate and regular rhythm.  Pulmonary:     Effort: Pulmonary effort is normal. No respiratory distress.  Abdominal:     General: There is distension.     Tenderness: There is no abdominal tenderness. There is no guarding.  Musculoskeletal:        General: No deformity.  Skin:    Findings: No rash.  Neurological:     Mental Status: She is alert.     (all labs ordered are listed,  but only abnormal results are displayed) Labs Reviewed  CBC WITH DIFFERENTIAL/PLATELET - Abnormal; Notable for the following components:      Result Value   Hemoglobin 11.0 (*)    HCT 33.5 (*)    RDW 18.3 (*)    Lymphs Abs 0.4 (*)    All other components within normal limits  I-STAT VENOUS BLOOD GAS, ED - Abnormal; Notable for the following components:   Acid-base deficit 3.0 (*)    Sodium 127 (*)    Potassium 5.5 (*)    All other components within normal limits  RESP PANEL BY RT-PCR (RSV, FLU A&B, COVID)  RVPGX2  COMPREHENSIVE METABOLIC PANEL WITH GFR  MAGNESIUM  AMMONIA  URINALYSIS,  ROUTINE W REFLEX MICROSCOPIC  BRAIN NATRIURETIC PEPTIDE  PROTIME-INR  TROPONIN I (HIGH SENSITIVITY)    EKG: None  Radiology: No results found.   .Critical Care  Performed by: Hildegard Loge, PA-C Authorized by: Hildegard Loge, PA-C   Critical care provider statement:    Critical care time (minutes):  76   Critical care was necessary to treat or prevent imminent or life-threatening deterioration of the following conditions: Hyponatremia requiring admission, hyperkalemia requiring Lokelma, heparin drip for subtherapeutic INR in the setting of mechanical valve.   Critical care was time spent personally by me on the following activities:  Development of treatment plan with patient or surrogate, discussions with consultants, evaluation of patient's response to treatment, examination of patient, ordering and review of laboratory studies, ordering and review of radiographic studies, ordering and performing treatments and interventions, pulse oximetry, re-evaluation of patient's condition and review of old charts   Care discussed with: admitting provider     Care discussed with comment:  Cardiologist    Medications Ordered in the ED  ondansetron (ZOFRAN) injection 4 mg (has no administration in time range)                                    Medical Decision Making Amount and/or Complexity of Data Reviewed Labs: ordered. Radiology: ordered.  Risk Prescription drug management.    Medical Decision Making / ED Course   This patient presents to the ED for concern of abdominal distention, peripheral edema, fatigue, this involves an extensive number of treatment options, and is a complaint that carries with it a high risk of complications and morbidity.  The differential diagnosis includes CHF exacerbation, pneumonia, SBP With pneumonia, UTI  MDM: 78 year old female presents today for concern of abdominal distention, confusion according to her daughter over the past month.  Compliant with  all of her home medicines. Alert and oriented during my conversation with the patient. No asterixis or jaundice that I can appreciate. Umbilical hernia present but this is chronic and is easily reducible without abdominal tenderness.  CBC without leukocytosis.  Mild anemia.  CMP shows hyponatremia at 124, potassium at 5.5.  Troponin negative.  BNP elevated at 640.  INR 1.5 which is subtherapeutic for her.  Respiratory panel negative.  Ammonia 15.  CT abdomen pelvis without contrast shows large volume abdominal pelvic ascites otherwise no acute finding.  CT head without acute finding.  Chest x-ray without signs of volume overload.  EKG without acute ischemic change.  Given the hyponatremia, significant volume overload discussed with hospitalist for admission.  They will evaluate patient.  I gave her a dose of 60 mg of Lasix.  Afebrile, without leukocytosis.  Low suspicion for SBP.  Discussed with cardiology.  They recommend heparin drip without bolus given setting of mechanical valve and patient is subtherapeutic.  Additional history obtained: -Additional history obtained from family at bedside, chart review -External records from outside source obtained and reviewed including: Chart review including previous notes, labs, imaging, consultation notes   Lab Tests: -I ordered, reviewed, and interpreted labs.   The pertinent results include:   Labs Reviewed  CBC WITH DIFFERENTIAL/PLATELET - Abnormal; Notable for the following components:      Result Value   Hemoglobin 11.0 (*)    HCT 33.5 (*)    RDW 18.3 (*)    Lymphs Abs 0.4 (*)    All other components within normal limits  COMPREHENSIVE METABOLIC PANEL WITH GFR - Abnormal; Notable for the following components:   Sodium 124 (*)    Potassium 5.5 (*)    Chloride 93 (*)    CO2 21 (*)    BUN 32 (*)    Creatinine, Ser 1.54 (*)    Total Protein 8.7 (*)    Albumin 3.0 (*)    Total Bilirubin 2.4 (*)    GFR, Estimated 34 (*)    All other  components within normal limits  BRAIN NATRIURETIC PEPTIDE - Abnormal; Notable for the following components:   B Natriuretic Peptide 639.2 (*)    All other components within normal limits  PROTIME-INR - Abnormal; Notable for the following components:   Prothrombin Time 19.0 (*)    INR 1.5 (*)    All other components within normal limits  I-STAT VENOUS BLOOD GAS, ED - Abnormal; Notable for the following components:   Acid-base deficit 3.0 (*)    Sodium 127 (*)    Potassium 5.5 (*)    All other components within normal limits  RESP PANEL BY RT-PCR (RSV, FLU A&B, COVID)  RVPGX2  MAGNESIUM  AMMONIA  URINALYSIS, ROUTINE W REFLEX MICROSCOPIC  TROPONIN I (HIGH SENSITIVITY)  TROPONIN I (HIGH SENSITIVITY)      EKG  EKG Interpretation Date/Time:  Thursday December 29 2023 15:37:50 EDT Ventricular Rate:  62 PR Interval:    QRS Duration:  161 QT Interval:  476 QTC Calculation: 484 R Axis:   225  Text Interpretation: Right and left arm electrode reversal, interpretation assumes no reversal Atrial fibrillation Nonspecific intraventricular conduction delay Consider anterior infarct Abnormal T, consider ischemia, lateral leads similar to prior no stemi Confirmed by Elnor Savant ([id number]) on 12/29/2023 6:38:03 PM         Imaging Studies ordered: I ordered imaging studies including CT abdomen pelvis without contrast, chest x-ray, CT head I independently visualized and interpreted imaging. I agree with the radiologist interpretation   Medicines ordered and prescription drug management: Meds ordered this encounter  Medications   ondansetron (ZOFRAN) injection 4 mg   furosemide (LASIX) injection 60 mg    -I have reviewed the patients home medicines and have made adjustments as needed  Critical interventions Lokelma for hyperkalemia, hyponatremia requiring admission   Social Determinants of Health:  Factors impacting patients care include: Good family support   Reevaluation: After  the interventions noted above, I reevaluated the patient and found that they have :stayed the same  Co morbidities that complicate the patient evaluation  Past Medical History:  Diagnosis Date   Atrial fibrillation (HCC)    Heart disease    aortic and mitral valve problems   Hepatic cirrhosis (HCC)    Hypothyroidism       Dispostion: Discussed with hospitalist for evaluation for  admission.  They will evaluate patient.   Final diagnoses:  Hyponatremia  Hyperkalemia  Subtherapeutic international normalized ratio (INR)  Elevated brain natriuretic peptide (BNP) level    ED Discharge Orders     None          Hildegard Loge, PA-C 12/29/23 1920    Hildegard Loge, PA-C 12/29/23 2032    Elnor Jayson LABOR, DO 12/29/23 2354    Elnor Jayson LABOR, DO 12/29/23 2354

## 2023-12-29 NOTE — Assessment & Plan Note (Signed)
Pt ot eval  

## 2023-12-30 ENCOUNTER — Observation Stay (HOSPITAL_COMMUNITY)

## 2023-12-30 DIAGNOSIS — D631 Anemia in chronic kidney disease: Secondary | ICD-10-CM | POA: Diagnosis present

## 2023-12-30 DIAGNOSIS — R531 Weakness: Secondary | ICD-10-CM | POA: Diagnosis not present

## 2023-12-30 DIAGNOSIS — Z515 Encounter for palliative care: Secondary | ICD-10-CM | POA: Diagnosis not present

## 2023-12-30 DIAGNOSIS — I5033 Acute on chronic diastolic (congestive) heart failure: Secondary | ICD-10-CM

## 2023-12-30 DIAGNOSIS — E871 Hypo-osmolality and hyponatremia: Secondary | ICD-10-CM | POA: Diagnosis present

## 2023-12-30 DIAGNOSIS — R7989 Other specified abnormal findings of blood chemistry: Secondary | ICD-10-CM | POA: Diagnosis not present

## 2023-12-30 DIAGNOSIS — Z952 Presence of prosthetic heart valve: Secondary | ICD-10-CM | POA: Diagnosis not present

## 2023-12-30 DIAGNOSIS — I2729 Other secondary pulmonary hypertension: Secondary | ICD-10-CM | POA: Diagnosis present

## 2023-12-30 DIAGNOSIS — E8809 Other disorders of plasma-protein metabolism, not elsewhere classified: Secondary | ICD-10-CM | POA: Diagnosis present

## 2023-12-30 DIAGNOSIS — I2781 Cor pulmonale (chronic): Secondary | ICD-10-CM

## 2023-12-30 DIAGNOSIS — I130 Hypertensive heart and chronic kidney disease with heart failure and stage 1 through stage 4 chronic kidney disease, or unspecified chronic kidney disease: Secondary | ICD-10-CM | POA: Diagnosis present

## 2023-12-30 DIAGNOSIS — N179 Acute kidney failure, unspecified: Secondary | ICD-10-CM | POA: Diagnosis present

## 2023-12-30 DIAGNOSIS — K7460 Unspecified cirrhosis of liver: Secondary | ICD-10-CM | POA: Diagnosis present

## 2023-12-30 DIAGNOSIS — K7290 Hepatic failure, unspecified without coma: Secondary | ICD-10-CM | POA: Diagnosis not present

## 2023-12-30 DIAGNOSIS — E43 Unspecified severe protein-calorie malnutrition: Secondary | ICD-10-CM | POA: Diagnosis present

## 2023-12-30 DIAGNOSIS — E039 Hypothyroidism, unspecified: Secondary | ICD-10-CM | POA: Diagnosis present

## 2023-12-30 DIAGNOSIS — Y831 Surgical operation with implant of artificial internal device as the cause of abnormal reaction of the patient, or of later complication, without mention of misadventure at the time of the procedure: Secondary | ICD-10-CM | POA: Diagnosis present

## 2023-12-30 DIAGNOSIS — K7682 Hepatic encephalopathy: Secondary | ICD-10-CM

## 2023-12-30 DIAGNOSIS — Z66 Do not resuscitate: Secondary | ICD-10-CM | POA: Diagnosis present

## 2023-12-30 DIAGNOSIS — I4821 Permanent atrial fibrillation: Secondary | ICD-10-CM | POA: Diagnosis present

## 2023-12-30 DIAGNOSIS — Z7189 Other specified counseling: Secondary | ICD-10-CM | POA: Diagnosis not present

## 2023-12-30 DIAGNOSIS — D696 Thrombocytopenia, unspecified: Secondary | ICD-10-CM | POA: Diagnosis present

## 2023-12-30 DIAGNOSIS — R188 Other ascites: Secondary | ICD-10-CM | POA: Diagnosis present

## 2023-12-30 DIAGNOSIS — E875 Hyperkalemia: Secondary | ICD-10-CM | POA: Diagnosis present

## 2023-12-30 DIAGNOSIS — T82857A Stenosis of cardiac prosthetic devices, implants and grafts, initial encounter: Secondary | ICD-10-CM | POA: Diagnosis present

## 2023-12-30 DIAGNOSIS — L03115 Cellulitis of right lower limb: Secondary | ICD-10-CM | POA: Diagnosis present

## 2023-12-30 DIAGNOSIS — N1832 Chronic kidney disease, stage 3b: Secondary | ICD-10-CM | POA: Diagnosis present

## 2023-12-30 DIAGNOSIS — I50810 Right heart failure, unspecified: Secondary | ICD-10-CM | POA: Diagnosis not present

## 2023-12-30 DIAGNOSIS — I824Y9 Acute embolism and thrombosis of unspecified deep veins of unspecified proximal lower extremity: Secondary | ICD-10-CM | POA: Diagnosis not present

## 2023-12-30 DIAGNOSIS — D649 Anemia, unspecified: Secondary | ICD-10-CM | POA: Diagnosis not present

## 2023-12-30 DIAGNOSIS — K7581 Nonalcoholic steatohepatitis (NASH): Secondary | ICD-10-CM | POA: Diagnosis present

## 2023-12-30 DIAGNOSIS — R791 Abnormal coagulation profile: Secondary | ICD-10-CM | POA: Diagnosis not present

## 2023-12-30 DIAGNOSIS — R64 Cachexia: Secondary | ICD-10-CM | POA: Diagnosis present

## 2023-12-30 DIAGNOSIS — I5082 Biventricular heart failure: Secondary | ICD-10-CM | POA: Diagnosis present

## 2023-12-30 HISTORY — PX: IR PARACENTESIS: IMG2679

## 2023-12-30 LAB — COMPREHENSIVE METABOLIC PANEL WITH GFR
ALT: 9 U/L (ref 0–44)
AST: 29 U/L (ref 15–41)
Albumin: 2.6 g/dL — ABNORMAL LOW (ref 3.5–5.0)
Alkaline Phosphatase: 60 U/L (ref 38–126)
Anion gap: 12 (ref 5–15)
BUN: 28 mg/dL — ABNORMAL HIGH (ref 8–23)
CO2: 20 mmol/L — ABNORMAL LOW (ref 22–32)
Calcium: 8.5 mg/dL — ABNORMAL LOW (ref 8.9–10.3)
Chloride: 93 mmol/L — ABNORMAL LOW (ref 98–111)
Creatinine, Ser: 1.38 mg/dL — ABNORMAL HIGH (ref 0.44–1.00)
GFR, Estimated: 39 mL/min — ABNORMAL LOW (ref >60)
Glucose, Bld: 65 mg/dL — ABNORMAL LOW (ref 70–99)
Potassium: 4.7 mmol/L (ref 3.5–5.1)
Sodium: 125 mmol/L — ABNORMAL LOW (ref 135–145)
Total Bilirubin: 2.1 mg/dL — ABNORMAL HIGH (ref 0.0–1.2)
Total Protein: 7.4 g/dL (ref 6.5–8.1)

## 2023-12-30 LAB — BODY FLUID CELL COUNT WITH DIFFERENTIAL
Eos, Fluid: 0 %
Lymphs, Fluid: 53 %
Monocyte-Macrophage-Serous Fluid: 39 % — ABNORMAL LOW (ref 50–90)
Neutrophil Count, Fluid: 8 % (ref 0–25)
Total Nucleated Cell Count, Fluid: 105 uL (ref 0–1000)

## 2023-12-30 LAB — URINE CULTURE: Culture: NO GROWTH

## 2023-12-30 LAB — PHOSPHORUS: Phosphorus: 3.4 mg/dL (ref 2.5–4.6)

## 2023-12-30 LAB — GLUCOSE, CAPILLARY: Glucose-Capillary: 67 mg/dL — ABNORMAL LOW (ref 70–99)

## 2023-12-30 LAB — ECHOCARDIOGRAM COMPLETE
AR max vel: 0.68 cm2
AV Area VTI: 0.72 cm2
AV Area mean vel: 0.68 cm2
AV Mean grad: 33 mmHg
AV Peak grad: 45.9 mmHg
Ao pk vel: 3.39 m/s
Area-P 1/2: 2.27 cm2
Calc EF: 61.7 %
Height: 61 in
MV VTI: 1.85 cm2
P 1/2 time: 550 ms
S' Lateral: 3 cm
Single Plane A2C EF: 62.5 %
Single Plane A4C EF: 54.1 %
Weight: 2240 [oz_av]

## 2023-12-30 LAB — CBC
HCT: 30.8 % — ABNORMAL LOW (ref 36.0–46.0)
Hemoglobin: 10.2 g/dL — ABNORMAL LOW (ref 12.0–15.0)
MCH: 26.8 pg (ref 26.0–34.0)
MCHC: 33.1 g/dL (ref 30.0–36.0)
MCV: 81.1 fL (ref 80.0–100.0)
Platelets: 165 K/uL (ref 150–400)
RBC: 3.8 MIL/uL — ABNORMAL LOW (ref 3.87–5.11)
RDW: 18.4 % — ABNORMAL HIGH (ref 11.5–15.5)
WBC: 4.5 K/uL (ref 4.0–10.5)
nRBC: 0 % (ref 0.0–0.2)

## 2023-12-30 LAB — PROTIME-INR
INR: 1.6 — ABNORMAL HIGH (ref 0.8–1.2)
Prothrombin Time: 20.2 s — ABNORMAL HIGH (ref 11.4–15.2)

## 2023-12-30 LAB — HEPARIN LEVEL (UNFRACTIONATED)
Heparin Unfractionated: 0.28 [IU]/mL — ABNORMAL LOW (ref 0.30–0.70)
Heparin Unfractionated: 0.36 [IU]/mL (ref 0.30–0.70)
Heparin Unfractionated: 0.53 [IU]/mL (ref 0.30–0.70)

## 2023-12-30 LAB — MAGNESIUM: Magnesium: 1.8 mg/dL (ref 1.7–2.4)

## 2023-12-30 LAB — LACTATE DEHYDROGENASE, PLEURAL OR PERITONEAL FLUID: LD, Fluid: 156 U/L — ABNORMAL HIGH (ref 3–23)

## 2023-12-30 LAB — VITAMIN B12: Vitamin B-12: 1469 pg/mL — ABNORMAL HIGH (ref 180–914)

## 2023-12-30 LAB — MRSA NEXT GEN BY PCR, NASAL: MRSA by PCR Next Gen: NOT DETECTED

## 2023-12-30 LAB — PROTEIN, PLEURAL OR PERITONEAL FLUID: Total protein, fluid: 4.4 g/dL

## 2023-12-30 LAB — GRAM STAIN: Gram Stain: NONE SEEN

## 2023-12-30 LAB — FOLATE: Folate: 10.1 ng/mL (ref >5.9)

## 2023-12-30 MED ORDER — SODIUM CHLORIDE 0.9% FLUSH: 3.0000 mL | INTRAVENOUS | Status: DC | PRN

## 2023-12-30 MED ORDER — LEVOTHYROXINE SODIUM 100 MCG/5ML IV SOLN
50.0000 ug | Freq: Every day | INTRAVENOUS | Status: DC
Start: 2023-12-31 — End: 2024-01-06
  Administered 2023-12-31 – 2024-01-06 (×7): 50 ug via INTRAVENOUS
  Filled 2023-12-30 (×7): qty 5

## 2023-12-30 MED ORDER — LACTULOSE 10 GM/15ML PO SOLN
20.0000 g | Freq: Two times a day (BID) | ORAL | Status: DC
  Administered 2023-12-30 – 2024-01-07 (×16): 20 g via ORAL
  Filled 2023-12-30 (×17): qty 30

## 2023-12-30 MED ORDER — WARFARIN SODIUM 5 MG PO TABS
5.0000 mg | ORAL_TABLET | Freq: Once | ORAL | Status: DC
  Filled 2023-12-30: qty 1

## 2023-12-30 MED ORDER — HYDROCODONE-ACETAMINOPHEN 5-325 MG PO TABS
1.0000 | ORAL_TABLET | ORAL | Status: DC | PRN
  Administered 2023-12-30 – 2024-01-02 (×2): 1 via ORAL
  Filled 2023-12-30 (×2): qty 1

## 2023-12-30 MED ORDER — FUROSEMIDE 10 MG/ML IJ SOLN
80.0000 mg | Freq: Two times a day (BID) | INTRAMUSCULAR | Status: DC
  Administered 2023-12-30 – 2024-01-04 (×9): 80 mg via INTRAVENOUS
  Filled 2023-12-30 (×9): qty 8

## 2023-12-30 MED ORDER — LIDOCAINE-EPINEPHRINE 1 %-1:100000 IJ SOLN
20.0000 mL | Freq: Once | INTRAMUSCULAR | Status: AC
Start: 2023-12-30 — End: 2023-12-30
  Administered 2023-12-30: 10 mL via INTRADERMAL

## 2023-12-30 MED ORDER — METOPROLOL SUCCINATE ER 25 MG PO TB24
25.0000 mg | ORAL_TABLET | Freq: Every day | ORAL | Status: DC
  Administered 2023-12-30 – 2024-01-15 (×12): 25 mg via ORAL
  Filled 2023-12-30 (×19): qty 1

## 2023-12-30 MED ORDER — WARFARIN - PHARMACIST DOSING INPATIENT: Freq: Every day | Status: DC

## 2023-12-30 MED ORDER — LEVOTHYROXINE SODIUM 100 MCG/5ML IV SOLN
50.0000 ug | Freq: Every day | INTRAVENOUS | Status: DC
  Filled 2023-12-30: qty 5

## 2023-12-30 MED ORDER — LEVOTHYROXINE SODIUM 75 MCG PO TABS
150.0000 ug | ORAL_TABLET | Freq: Every day | ORAL | Status: DC
  Administered 2023-12-30: 150 ug via ORAL
  Filled 2023-12-30: qty 2

## 2023-12-30 MED ORDER — SPIRONOLACTONE 25 MG PO TABS
25.0000 mg | ORAL_TABLET | Freq: Every day | ORAL | Status: DC
  Administered 2023-12-31 – 2024-01-16 (×16): 25 mg via ORAL
  Filled 2023-12-30 (×16): qty 1

## 2023-12-30 MED ORDER — ONDANSETRON HCL 4 MG PO TABS: 4.0000 mg | ORAL_TABLET | Freq: Four times a day (QID) | ORAL | Status: DC | PRN

## 2023-12-30 MED ORDER — MIDODRINE HCL 5 MG PO TABS
5.0000 mg | ORAL_TABLET | Freq: Three times a day (TID) | ORAL | Status: DC
  Administered 2023-12-30 – 2024-01-16 (×49): 5 mg via ORAL
  Filled 2023-12-30 (×50): qty 1

## 2023-12-30 MED ORDER — ONDANSETRON HCL 4 MG/2ML IJ SOLN
4.0000 mg | Freq: Four times a day (QID) | INTRAMUSCULAR | Status: DC | PRN
  Administered 2023-12-30: 4 mg via INTRAVENOUS
  Filled 2023-12-30: qty 2

## 2023-12-30 MED ORDER — SODIUM CHLORIDE 0.9% FLUSH
3.0000 mL | Freq: Two times a day (BID) | INTRAVENOUS | Status: DC
  Administered 2023-12-30 – 2024-01-16 (×34): 3 mL via INTRAVENOUS

## 2023-12-30 MED ORDER — SPIRONOLACTONE 25 MG PO TABS
25.0000 mg | ORAL_TABLET | Freq: Every day | ORAL | Status: DC
  Administered 2023-12-30: 25 mg via ORAL
  Filled 2023-12-30: qty 1

## 2023-12-30 MED ORDER — RIFAXIMIN 550 MG PO TABS
550.0000 mg | ORAL_TABLET | Freq: Two times a day (BID) | ORAL | Status: DC
  Administered 2023-12-30 – 2024-01-16 (×35): 550 mg via ORAL
  Filled 2023-12-30 (×36): qty 1

## 2023-12-30 MED ORDER — LIDOCAINE-EPINEPHRINE 1 %-1:100000 IJ SOLN
INTRAMUSCULAR | Status: AC
  Filled 2023-12-30: qty 1

## 2023-12-30 MED ORDER — ALBUMIN HUMAN 25 % IV SOLN
50.0000 g | INTRAVENOUS | Status: AC
  Administered 2023-12-30: 12.5 g via INTRAVENOUS
  Filled 2023-12-30: qty 200

## 2023-12-30 MED ORDER — SODIUM CHLORIDE 0.9 % IV SOLN: 250.0000 mL | INTRAVENOUS | Status: AC | PRN

## 2023-12-30 NOTE — ED Notes (Addendum)
 Current meds not verified unable to give at this time. OT just left bedside. Called IR to notify pt bed status is cleanings and pt will need to transport to 5W after procedure. [facility]W charge also aware

## 2023-12-30 NOTE — Evaluation (Signed)
 Occupational Therapy Evaluation Patient Details Name: Chelsea Boyer MRN: [id number] DOB: December 04, 1945 Today's Date: 12/30/2023   History of Present Illness   Pt is a 78 y.o female presenting to the ED 10/9 for hyponatremia and volume overload related to cirrhosis. EFY:rpmmyndpd, CHF, CKD, hypothyroidism, s/p TAVR     Clinical Impressions Pt admitted based on above, and was seen based on problem list below. PTA pt was independent with ADLs and IADLs. Today pt is requiring set up  to mod assist for ADLs. Bed mobility was mod assist from stretcher and functional transfers are  CGA with RW. Pt primarily limited by pain in R hip, causing decreased balance. Based on today's performance recommending HHOT upon d/c. OT will continue to follow acutely to maximize functional independence.    If plan is discharge home, recommend the following:   A little help with walking and/or transfers;A little help with bathing/dressing/bathroom;Assistance with cooking/housework     Functional Status Assessment   Patient has had a recent decline in their functional status and demonstrates the ability to make significant improvements in function in a reasonable and predictable amount of time.     Equipment Recommendations   None recommended by OT      Precautions/Restrictions   Precautions Precautions: Fall Recall of Precautions/Restrictions: Intact Precaution/Restrictions Comments: watch BP Restrictions Weight Bearing Restrictions Per Provider Order: No     Mobility Bed Mobility Overal bed mobility: Needs Assistance Bed Mobility: Supine to Sit, Sit to Supine     Supine to sit: Mod assist, HOB elevated Sit to supine: Max assist, HOB elevated   General bed mobility comments: Assist to bring R leg off scretcher and lift trunk, assist to return to stretcher and scoot up    Transfers Overall transfer level: Needs assistance Equipment used: Rolling walker (2 wheels) Transfers: Sit to/from  Stand Sit to Stand: Contact guard assist     General transfer comment: Able to stand from stretcher, and regular height toilet, slow to rise, no assist.      Balance Overall balance assessment: Needs assistance Sitting-balance support: No upper extremity supported, Feet supported Sitting balance-Leahy Scale: Good     Standing balance support: Bilateral upper extremity supported, During functional activity, Reliant on assistive device for balance Standing balance-Leahy Scale: Poor Standing balance comment: reliant on RW     ADL either performed or assessed with clinical judgement   ADL Overall ADL's : Needs assistance/impaired Eating/Feeding: Set up;Sitting   Grooming: Wash/dry hands;Contact guard assist;Standing           Upper Body Dressing : Set up;Sitting   Lower Body Dressing: Moderate assistance;Sit to/from stand Lower Body Dressing Details (indicate cue type and reason): Assist for socks and to thread legs Toilet Transfer: Contact guard assist;Ambulation;Regular Toilet;Rolling walker (2 wheels)   Toileting- Clothing Manipulation and Hygiene: Contact guard assist;Sit to/from stand       Functional mobility during ADLs: Contact guard assist;Rolling walker (2 wheels) General ADL Comments: CGA for balance     Vision Baseline Vision/History: 0 No visual deficits Patient Visual Report: No change from baseline Vision Assessment?: No apparent visual deficits            Pertinent Vitals/Pain Pain Assessment Pain Assessment: Faces Faces Pain Scale: Hurts even more Pain Location: back, R hip Pain Descriptors / Indicators: Discomfort, Sore, Grimacing, Guarding Pain Intervention(s): Limited activity within patient's tolerance     Extremity/Trunk Assessment Upper Extremity Assessment Upper Extremity Assessment: Right hand dominant   Lower Extremity Assessment Lower Extremity  Assessment: Generalized weakness;RLE deficits/detail;LLE deficits/detail RLE Deficits  / Details: grossly >/= 4/5 MMT; denied numbness/tingling; pitting edema 2+; erythema noted lower leg LLE Deficits / Details: grossly >/= 4/5 MMT; denied numbness/tingling; pitting edema 3+   Cervical / Trunk Assessment Cervical / Trunk Assessment: Other exceptions Cervical / Trunk Exceptions: hernia noted central upper abdomen; edema at trunk   Communication Communication Communication: No apparent difficulties   Cognition Arousal: Alert Behavior During Therapy: WFL for tasks assessed/performed Cognition: No apparent impairments     Following commands: Intact       Cueing  General Comments   Cueing Techniques: Verbal cues  VSS on RA           Home Living Family/patient expects to be discharged to:: Private residence Living Arrangements: Spouse/significant other Available Help at Discharge: Family;Available 24 hours/day Type of Home: House Home Access: Stairs to enter [employer] of Steps: 3 Entrance Stairs-Rails: Right;Left Home Layout: One level     Bathroom Shower/Tub: Producer, television/film/video: Standard     Home Equipment: None          Prior Functioning/Environment Prior Level of Function : Independent/Modified Independent;Driving     Mobility Comments: No AD, no falls in past 6 months ADLs Comments: retired    OT Problem List: Decreased strength;Decreased range of motion;Decreased activity tolerance;Impaired balance (sitting and/or standing);Decreased knowledge of use of DME or AE;Cardiopulmonary status limiting activity   OT Treatment/Interventions: Self-care/ADL training;Therapeutic exercise;Energy conservation;DME and/or AE instruction;Therapeutic activities;Patient/family education;Balance training      OT Goals(Current goals can be found in the care plan section)   Acute Rehab OT Goals Patient Stated Goal: To get better OT Goal Formulation: With patient Time For Goal Achievement: 01/13/24 Potential to Achieve Goals:  Good   OT Frequency:  Min 2X/week       AM-PAC OT 6 Clicks Daily Activity     Outcome Measure Help from another person eating meals?: None Help from another person taking care of personal grooming?: A Little Help from another person toileting, which includes using toliet, bedpan, or urinal?: A Little Help from another person bathing (including washing, rinsing, drying)?: A Lot Help from another person to put on and taking off regular upper body clothing?: A Little Help from another person to put on and taking off regular lower body clothing?: A Lot 6 Click Score: 17   End of Session Equipment Utilized During Treatment: Gait belt;Rolling walker (2 wheels) Nurse Communication: Mobility status  Activity Tolerance: Patient tolerated treatment well Patient left: in bed;Other (comment) (With transport)  OT Visit Diagnosis: Unsteadiness on feet (R26.81);Other abnormalities of gait and mobility (R26.89);Muscle weakness (generalized) (M62.81)                Time: 8981-8959 OT Time Calculation (min): 22 min Charges:  OT General Charges $OT Visit: 1 Visit OT Evaluation $OT Eval Moderate Complexity: 1 Mod  Adrianne BROCKS, OT  Acute Rehabilitation Services Office [phone] Secure chat preferred   Adrianne GORMAN Savers 12/30/2023, 11:52 AM

## 2023-12-30 NOTE — Consult Note (Addendum)
 Consultation  Referring Provider:TR[facility]/ Fairy Primary Care Physician:  Chrystal Lamarr RAMAN, MD Primary Gastroenterologist:  Dr.Danis  Reason for Consultation:  NASH cirrhosis with ascites  HPI: Chelsea Boyer is a 78 y.o. female with history of congestive heart failure, chronic kidney disease, NASH/MASH cirrhosis, A-fib, on chronic Coumadin  status post TAVR, also with history of mitral valve replacement, pulmonary hypertension, coronary artery disease status post prior MI.  Previous EF 50 to 55% with moderate reduced right ventricular function.  ( 2024). Patient was brought to the ER last night by family due to concerns for progressive fatigue, increased sleeping, some confusion, and concerns for increased peripheral edema. She is known to have ascites but has not been tapped in the past as she had not wanted to come off of Coumadin . Per the patient she says her husband has been more concerned than she has been about lower extremity swelling and fluid in her abdomen.  She says her abdomen does feel tight but it does not really bother her and there is no pain.  She says that her legs have been a bit more swollen over the past month or so.  Apparently she gives herself her own medications and says she has been taking everything but there is some concern from family perspective whether this is accurate.  She has no current complaints of chest pain or shortness of breath. She had been on torsemide  20 mg p.o. daily and Aldactone  25 mg daily.  Workup in the ER last p.m. with CT of the head showing a remote right cerebellar infarct otherwise negative. EKG showed patient to be in A-fib and heparin started  CT of the abdomen and pelvis without IV contrast shows multiple small bilateral effusions, cardiomegaly, cirrhotic appearing liver without obvious lesion, calcified gallstones, normal-sized spleen, large amount of ascites.  There is a small segment of transverse colon and umbilical hernia and  evidence of sigmoid diverticulosis.  Labs show TSH 19 Troponins negative BNP 639 Pro time 19/INR 1.5 Respiratory panel negative Venous ammonia 15 WBC 4.3/hemoglobin 11/hematocrit 33/platelets 174 Sodium 124/potassium 5.5/BUN 32/creatinine 1.54 Albumin 3.0 T. 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   Past Medical History:  Diagnosis Date   Atrial fibrillation (HCC)    Heart disease    aortic and mitral valve problems   Hepatic cirrhosis (HCC)    Hypothyroidism     Past Surgical History:  Procedure Laterality Date   ANKLE SURGERY  1998   AORTIC VALVE REPLACEMENT  2018   CESAREAN SECTION     MITRAL VALVE REPLACEMENT     x 2   TOTAL HIP ARTHROPLASTY Left 2014    Prior to Admission medications   Medication Sig Start Date End Date Taking? Authorizing Provider  alendronate (FOSAMAX) 70 MG tablet Take 70 mg by mouth once a week. 05/14/21  Yes [provider]  amoxicillin  (AMOXIL ) 500 MG capsule TAKE 4 TABLETS (2000 MG) PRIOR TO THE DENTAL  VISIT 01/26/22  Yes Croitoru, Mihai, MD  Cholecalciferol (VITAMIN D3) 50 MCG (2000 UT) TABS Take 6,000 Units by mouth daily.   Yes [provider]  fluticasone (FLONASE) 50 MCG/ACT nasal spray Place 1 spray into both nostrils daily. 12/03/21  Yes [provider]  levothyroxine (SYNTHROID) 125 MCG tablet Take 125 mcg by mouth every morning. 11/25/22  Yes [provider]  metoprolol  succinate  (TOPROL -XL) 50 MG 24 hr tablet TAKE 1 TABLET BY MOUTH EVERY DAY WITH OR IMMEDIATELY FOLLOWING A MEAL 01/19/23  Yes Croitoru, Mihai, MD  spironolactone  (ALDACTONE ) 25 MG tablet Take 1 tablet (25 mg total) by mouth every other day. Patient taking differently: Take 50 mg by mouth daily. 10/31/20  Yes Croitoru, Mihai, MD  torsemide  (DEMADEX ) 20 MG tablet TAKE 1 TABLET BY MOUTH DAILY. FOR A WEIGHT OVER 160 LBS, TAKE 40 MG ONCE DAILY. Patient taking differently: Take 40 mg by mouth daily. 04/14/23  Yes Croitoru, Mihai, MD  warfarin (COUMADIN ) 5 MG tablet TAKE 1/2 TO 1 TABLET BY MOUTH DAILY AS DIRECTED BY COUMADIN  CLINIC Patient taking differently: Take 2.5 mg by mouth See admin instructions. TAKE 1/2 TABLET BY MOUTH DAILY AS DIRECTED BY COUMADIN  CLINIC 10/12/23  Yes Croitoru, Mihai, MD    Current Facility-Administered Medications  Medication Dose Route Frequency Provider Last Rate Last Admin   0.9 %  sodium chloride infusion  250 mL Intravenous PRN Doutova, Anastassia, MD       doxycycline (VIBRA-TABS) tablet 100 mg  100 mg Oral Q12H Doutova, Anastassia, MD   100 mg at 12/30/23 0158   heparin ADULT infusion 100 units/mL (25000 units/250mL)  950 Units/hr Intravenous Continuous Tanda Powell ORN, RPH 9.5 mL/hr at 12/30/23 0805 950 Units/hr at 12/30/23 0805   HYDROcodone-acetaminophen (NORCO/VICODIN) 5-325 MG per tablet 1-2 tablet  1-2 tablet Oral Q4H PRN Doutova, Anastassia, MD       levothyroxine (SYNTHROID) tablet 150 mcg  150 mcg Oral Q0600 Doutova, Anastassia, MD   150 mcg at 12/30/23 9366   metoprolol  succinate (TOPROL -XL) 24 hr tablet 25 mg  25 mg Oral Daily Doutova, Anastassia, MD       ondansetron (ZOFRAN) injection 4 mg  4 mg Intravenous Once Doutova, Anastassia, MD       ondansetron (ZOFRAN) tablet 4 mg  4 mg Oral Q6H PRN Doutova, Anastassia, MD       Or   ondansetron (ZOFRAN) injection 4 mg  4 mg Intravenous Q6H PRN Doutova, Anastassia, MD       sodium chloride flush (NS) 0.9 % injection 3 mL  3  mL Intravenous Q12H Doutova, Anastassia, MD       sodium chloride flush (NS) 0.9 % injection 3 mL  3 mL Intravenous PRN Doutova, Anastassia, MD       Current Outpatient Medications  Medication Sig Dispense Refill   alendronate (FOSAMAX) 70 MG tablet Take 70 mg by mouth once a week.     amoxicillin  (AMOXIL ) 500 MG capsule TAKE 4 TABLETS (2000 MG) PRIOR TO THE DENTAL VISIT 4 capsule 1   Cholecalciferol (VITAMIN D3) 50 MCG (2000 UT) TABS Take 6,000 Units by mouth daily.     fluticasone (FLONASE) 50 MCG/ACT nasal spray Place 1 spray into both nostrils daily.     levothyroxine (SYNTHROID) 125 MCG tablet Take 125 mcg by mouth every morning.     metoprolol  succinate (TOPROL -XL) 50 MG 24 hr tablet TAKE 1 TABLET BY MOUTH EVERY DAY WITH OR IMMEDIATELY FOLLOWING A MEAL 90 tablet 3  spironolactone  (ALDACTONE ) 25 MG tablet Take 1 tablet (25 mg total) by mouth every other day. (Patient taking differently: Take 50 mg by mouth daily.) 90 tablet 2   torsemide  (DEMADEX ) 20 MG tablet TAKE 1 TABLET BY MOUTH DAILY. FOR A WEIGHT OVER 160 LBS, TAKE 40 MG ONCE DAILY. (Patient taking differently: Take 40 mg by mouth daily.) 180 tablet 2   warfarin (COUMADIN ) 5 MG tablet TAKE 1/2 TO 1 TABLET BY MOUTH DAILY AS DIRECTED BY COUMADIN  CLINIC (Patient taking differently: Take 2.5 mg by mouth See admin instructions. TAKE 1/2 TABLET BY MOUTH DAILY AS DIRECTED BY COUMADIN  CLINIC) 90 tablet 1    Allergies as of 12/29/2023   (No Known Allergies)    Family History  Problem Relation Age of Onset   Rheumatic fever Mother    Heart disease Mother 39   Emphysema Brother    Heart attack Brother    Breast cancer Maternal Grandmother    Heart disease Brother    Diabetes Brother     Social History   Socioeconomic History   Marital status: Married    Spouse name: Not on file   Number of children: 1   Years of education: Not on file   Highest education level: Not on file  Occupational History   Occupation: retired  Tobacco  Use   Smoking status: Never   Smokeless tobacco: Never  Vaping Use   Vaping status: Never Used  Substance and Sexual Activity   Alcohol use: Yes    Comment: rarely   Drug use: Not Currently   Sexual activity: Not on file  Other Topics Concern   Not on file  Social History Narrative   Not on file   Social Drivers of Health   Financial Resource Strain: Not on file  Food Insecurity: Not on file  Transportation Needs: Not on file  Physical Activity: Not on file  Stress: Not on file  Social Connections: Not on file  Intimate Partner Violence: Not on file    Review of Systems: Pertinent positive and negative review of systems were noted in the above HPI section.  All other review of systems was otherwise negative.   Physical Exam: Vital signs in last 24 hours: Temp:  [97.4 F (36.3 C)-98.7 F (37.1 C)] 98.7 F (37.1 C) (10/10 0758) Pulse Rate:  [60-86] 86 (10/10 0815) Resp:  [15-21] 19 (10/10 0815) BP: (108-144)/(51-98) 108/51 (10/10 0815) SpO2:  [100 %] 100 % (10/10 0815) Weight:  [63.5 kg] 63.5 kg (10/09 1452)   General:   Alert,  Well-developed, phonically ill-appearing elderly white female pleasant and cooperative in NAD Head:  Normocephalic and atraumatic. Eyes:  Sclera clear, no icterus.   Conjunctiva pink. Ears:  Normal auditory acuity. Nose:  No deformity, discharge,  or lesions. Mouth:  No deformity or lesions.   Neck:  Supple; no masses or thyromegaly.  +JVD Lungs: Creased breath sounds bilateral bases  Heart:  irRegular rate and rhythm; no murmurs, clicks, rubs,  or gallops. Abdomen: Marked ascites though nontense, nontender, ventral hernia reducible, no palpable mass or hepatosplenomegaly bowel sounds present Rectal: Not done Msk:  Symmetrical without gross deformities. . Pulses:  Normal pulses noted. Extremities: 2+ edema to the knees Neurologic:  Alert and  oriented x3;  -oriented to year, said month was April and season was spring president was Greg  corrected herself to Trump-no asterixis Skin:  Intact without significant lesions or rashes.. Psych:  Alert and cooperative. Normal mood and affect.  Intake/Output from  previous day: No intake/output data recorded. Intake/Output this shift: No intake/output data recorded.  Lab Results: Recent Labs    12/29/23 1537 12/29/23 1550 12/30/23 0345  WBC 4.3  --  4.5  HGB 11.0* 12.9 10.2*  HCT 33.5* 38.0 30.8*  PLT 174  --  165   BMET Recent Labs    12/29/23 1537 12/29/23 1550 12/29/23 2005 12/30/23 0639  NA 124* 127* 125* 125*  K 5.5* 5.5* 5.2* 4.7  CL 93*  --  94* 93*  CO2 21*  --  19* 20*  GLUCOSE 83  --  75 65*  BUN 32*  --  31* 28*  CREATININE 1.54*  --  1.43* 1.38*  CALCIUM 9.2  --  8.8* 8.5*   LFT Recent Labs    12/30/23 0639  PROT 7.4  ALBUMIN 2.6*  AST 29  ALT 9  ALKPHOS 60  BILITOT 2.1*   PT/INR Recent Labs    12/29/23 1537 12/30/23 0345  LABPROT 19.0* 20.2*  INR 1.5* 1.6*   Hepatitis Panel No results for input(s): HEPBSAG, HCVAB, HEPAIGM, HEPBIGM in the last 72 hours.   IMPRESSION:  #3 78 year old white female with known Hollie cirrhosis with ascites, and previously documented evidence for portal hypertension on EGD with small varix and portal gastropathy 2020. Ascites has been managed with low-dose furosemide and Aldactone , patient had not had prior paracentesis as has been chronically anticoagulated  Brought to the ER last p.m. by family with concerns for increased edema peripheral edema, question increased abdominal girth over her baseline, and also concerns for progressive fatigue and some recent confusion  CT head negative/remote infarct  EKG A-fib now on heparin  BNP elevated at 639  MELD-Na here 27-partially being driven by hyponatremia  Patient does have history of coronary artery disease, status post prior MI, congestive heart failure with EF 50 to 55% 2024, status post mitral valve replacement, history of pulmonary  hypertension and status post TAVR.  Venous ammonia is normal here, though clinically she does have evidence of mild hepatic encephalopathy  Workup is in progress to evaluate for worsening of heart failure as etiology for increase in peripheral edema  #2 hyponatremia #3 chronic Coumadin  use-INR normal here query if she had been taking medication accurately #4 anemia Normocytic, chronic #5 Hypothyroid- TSH 19    PLAN: 2 g sodium diet Cardiology evaluation in progress, 2D echo pending Will start lactulose 30 cc twice daily, and Xifaxan 550 twice daily Patient still seems hesitant to undergo paracentesis.  We can get this done later today or tomorrow, will need to hold heparin for 6 hours-should be therapeutic and diagnostic Check AFP GI will follow with you Will await cardiac evaluation to determine changes in diuretics/ management of diuresis   Amy Esterwood PA-C 12/30/2023, 10:03 AM    ATTENDING ADDENDUM 78 year old female with a history of M cirrhosisASH complicated by ascites, CHF, CKD, A-fib on Coumadin  status post TAVR, pulmonary hypertension, admitted to the hospital last night for fatigue, confusion, worsening edema and ascites.  On torsemide  and Aldactone  low-dose as outpatient.  INR was 1.5 at admission, she was found to be in A-fib in the ED and heparin started.  CT scan shows cirrhosis with large amount of ascites.  She also has an umbilical hernia  that contains part of the transverse colon.  Cardiomegaly and pleural effusions noted as well.  MELD is 27.  She is decompensated with worsening ascites.  History of heart failure, she does need an echo to reassess  cardiac functioning, that is pending.  Agree with paracentesis to rule out SBP.  She had that ordered and done by IR, 10 L removed today.  Cell count is pending.  We will give albumin to help protect her kidneys given the amount of peritoneal fluid removed during paracentesis.  Obviously if she has SBP she will need to be  treated for that.  Otherwise, low-sodium diet, titrate up diuretics as renal function allows.  Await echocardiogram.  Given possible hepatic encephalopathy, starting on lactulose and rifaximin.  Will check AFP for HCC screening.  Otherwise, given her anticoagulated status and decompensated cirrhosis, she is in need of varices screening.  She can have that done as outpatient with her primary GI MD, Dr. Legrand. She is not a good candidate for beta blockade at the moment with worsening ascites.  We will reassess her tomorrow, call us  with questions or concerns in the interim.  I personally saw the patient and performed a substantive portion of this encounter (>50% time spent), including complex MDM  Marcey Naval, MD [facility]

## 2023-12-30 NOTE — Progress Notes (Signed)
  Echocardiogram 2D Echocardiogram has been performed.  Norleen ORN Yasin Ducat 12/30/2023, 10:12 AM

## 2023-12-30 NOTE — ED Notes (Signed)
 Awaiting albumin medicating from pharmacy sent request

## 2023-12-30 NOTE — Plan of Care (Signed)
  Problem: Clinical Measurements: Goal: Ability to maintain clinical measurements within normal limits will improve Outcome: Progressing Goal: Will remain free from infection Outcome: Progressing Goal: Diagnostic test results will improve Outcome: Progressing Goal: Respiratory complications will improve Outcome: Progressing Goal: Cardiovascular complication will be avoided Outcome: Progressing   Problem: Activity: Goal: Risk for activity intolerance will decrease Outcome: Progressing   Problem: Nutrition: Goal: Adequate nutrition will be maintained Outcome: Progressing   Problem: Coping: Goal: Level of anxiety will decrease Outcome: Progressing   Problem: Elimination: Goal: Will not experience complications related to urinary retention Outcome: Progressing   Problem: Pain Managment: Goal: General experience of comfort will improve and/or be controlled Outcome: Progressing   Problem: Safety: Goal: Ability to remain free from injury will improve Outcome: Progressing   Problem: Skin Integrity: Goal: Risk for impaired skin integrity will decrease Outcome: Progressing   Problem: Education: Goal: Ability to demonstrate management of disease process will improve Outcome: Progressing Goal: Ability to verbalize understanding of medication therapies will improve Outcome: Progressing Goal: Individualized Educational Video(s) Outcome: Progressing   Problem: Activity: Goal: Capacity to carry out activities will improve Outcome: Progressing   Problem: Cardiac: Goal: Ability to achieve and maintain adequate cardiopulmonary perfusion will improve Outcome: Progressing   Problem: Clinical Measurements: Goal: Ability to avoid or minimize complications of infection will improve Outcome: Progressing   Problem: Skin Integrity: Goal: Skin integrity will improve Outcome: Progressing

## 2023-12-30 NOTE — Consult Note (Signed)
 Cardiology Consultation   Patient ID: Chelsea Boyer MRN: [id number]; DOB: 10/20/1945  Admit date: 12/29/2023 Date of Consult: 12/30/2023  PCP:  Chrystal Lamarr RAMAN, MD   [facility] Providers Cardiologist:  Jerel Balding, MD        Patient Profile: Chelsea Boyer is a 78 y.o. 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 Fairy.  History of Present Illness: Ms. Acrey is a 78 year old female with a complex history as above who is followed by Dr. Balding.  She has a long history of valvular heart disease suspected to be rheumatic in nature is undergone multiple valve surgeries in the past. She had a mitral valve replacement with a biological prosthesis in 1987 with subsequent replacement of the biological prosthesis with a St Jude mechanical valve in 1996.  She subsequently developed severe aortic stenosis and underwent TAVR in 10/2016.  She also has severe tricuspid regurgitation and pulmonary artery pretension leading to right heart failure.  It has been hard to say whether her tricuspid regurgitation is due to rheumatic involvement or simply due to sequela from longstanding mitral valve disease.  Last Echo in 11/2022 showed LVEF of 50-55%, moderately reduced RV, possible to stenosis of the TAVR valve with mild to moderate AI (mean gradient 23.5 mmHg), and severe TR. Echo report mentions then normal mitral valve with no evidence of stenosis or regurgitation.  Per Dr. Tyrone review of  Echo, there is no significant regurgitation of either the aortic or mitral valves but both had some moderate stenosis.  Overall, Echo was unchanged from prior one in 2023.  Patient was last seen by Dr. Balding in 11/2022 which time she was stable from a cardiac standpoint.  It was felt that her cirrhosis may be due to severe tricuspid regurgitation and pulmonary hypertension.  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  Patient presented to the ED yesterday for further evaluation of confusion, fatigue, and swelling. EKG showed atrial fibrillation, rate 62 bpm, with LBBB and T wave inversions in leads I and aVL no significant changes compared to prior tracing.  High-sensitivity troponin negative x2.  BNP elevated at 639.  Chest x-ray showed no acute findings. WBC 4.3, Hgb 11.0, Plts 174.  Serum osmolality 285.  In Na 124, K 5.5, Cl 93, CO2 21, Glucose 83, BUN 32, Cr 1.54. Albumin 3.0, AST 35, ALT 10, Alk Phos 74, Total Bili 2.4.  Serum osmolality 285.  Urine osmolality 332 and urine sodium 57.  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  Abdominal/pelvic CT showed hepatic cirrhosis with large volume abdominopelvic ascites and small left and trace right pleural effusions as well as generalized body wall edema.  It was unclear whether her volume overload was more due to her cirrhosis or CHF.  She was given 1 dose of IV Lasix in the ED.  GI was consulted for evaluation of cirrhosis and abdominal ascites.  She underwent a paracentesis today with removal of 10 L of hazy dark yellow fluid.  Cardiology  was consulted for further evaluation of CHF.  Patient's husband states that she has had significant fatigue over the last 6 to 8 weeks but especially over the last 2 weeks.  He states she will be in bed for 18 hours a day and then get up and sit in the recliner and sleeps more.  She has been so tired that she has  not even been able to do daily chores around the house.  Patient states she was not aware that she had been sleeping so much.  She denies any chest pain or shortness of breath but husband states that he does feel like her breathing has been more shallow.  She denies any orthopnea or PND.  She has had significant edema her abdomen and lower extremities.  She notes significant abdominal distention over the last 2 to 3 months.  She denies any abdominal pain, nausea, or vomiting with this but has had a poor appetite.  She has also noticed bilateral lower extremity edema (right > left) over the last few weeks.  She reports redness of her right leg for the past week. She has also had some cramping in her legs and feet.  Denies any palpitations, lightheadedness, dizziness, or syncope.  No recent fevers or illnesses.  She has had a mild cough recently but nothing significant.  Husband also states that she has been more confused lately especially in regards to dates and appointment times.  Her long-term memory has been fine but she has had some short-term memory issues.   Past Medical History:  Diagnosis Date   Atrial fibrillation (HCC)    Heart disease    aortic and mitral valve problems   Hepatic cirrhosis (HCC)    Hypothyroidism     Past Surgical History:  Procedure Laterality Date   ANKLE SURGERY  1998   AORTIC VALVE REPLACEMENT  2018   CESAREAN SECTION     IR PARACENTESIS  12/30/2023   MITRAL VALVE REPLACEMENT     x 2   TOTAL HIP ARTHROPLASTY Left 2014     Home Medications:  Prior to Admission medications   Medication Sig Start Date End Date Taking? Authorizing Provider  alendronate (FOSAMAX) 70 MG tablet Take 70 mg by mouth once a week. 05/14/21  Yes [provider]  amoxicillin  (AMOXIL ) 500 MG capsule TAKE 4 TABLETS (2000 MG) PRIOR TO THE DENTAL VISIT 01/26/22  Yes Croitoru, Mihai, MD  Cholecalciferol (VITAMIN D3) 50 MCG (2000 UT) TABS Take 6,000 Units by mouth daily.   Yes [provider]  fluticasone (FLONASE) 50 MCG/ACT nasal spray Place 1 spray into both nostrils daily. 12/03/21  Yes [provider]  levothyroxine (SYNTHROID) 125 MCG tablet Take 125 mcg by mouth every morning. 11/25/22  Yes [provider]  metoprolol  succinate (TOPROL -XL) 50 MG 24 hr tablet TAKE 1 TABLET BY MOUTH EVERY DAY WITH OR IMMEDIATELY FOLLOWING A MEAL 01/19/23  Yes Croitoru, Mihai, MD  spironolactone  (ALDACTONE ) 25 MG tablet Take 1 tablet (25 mg total) by mouth every other day. Patient taking differently: Take 50 mg by mouth daily. 10/31/20  Yes Croitoru, Mihai, MD  torsemide  (DEMADEX ) 20 MG tablet TAKE 1 TABLET BY MOUTH DAILY. FOR A WEIGHT OVER 160 LBS, TAKE 40 MG ONCE DAILY. Patient taking differently: Take 40 mg by mouth daily. 04/14/23  Yes Croitoru, Mihai, MD  warfarin (COUMADIN ) 5 MG tablet TAKE 1/2 TO 1 TABLET BY MOUTH DAILY AS DIRECTED BY COUMADIN  CLINIC Patient taking differently: Take 2.5 mg by  mouth See admin instructions. TAKE 1/2 TABLET BY MOUTH DAILY AS DIRECTED BY COUMADIN  CLINIC 10/12/23  Yes Croitoru, Mihai, MD    Scheduled Meds:  furosemide  80 mg Intravenous BID   lactulose  20 g Oral BID   [START ON 12/31/2023] levothyroxine  50 mcg Intravenous Daily   metoprolol  succinate  25 mg Oral Daily   ondansetron (ZOFRAN) IV  4 mg Intravenous Once   rifaximin  550 mg Oral BID   sodium chloride flush  3 mL Intravenous Q12H   warfarin  5 mg Oral ONCE-1600   Warfarin - Pharmacist Dosing Inpatient   Does not apply q1600   Continuous Infusions:  sodium chloride     albumin human     heparin 950 Units/hr (12/30/23 0805)   PRN Meds: sodium chloride, HYDROcodone-acetaminophen, ondansetron **OR** ondansetron (ZOFRAN) IV, sodium chloride flush  Allergies:   No Known Allergies  Social History:   Social History   Socioeconomic History   Marital status: Married    Spouse name: Not on file   Number of children: 1   Years of education: Not on file   Highest  education level: Not on file  Occupational History   Occupation: retired  Tobacco Use   Smoking status: Never   Smokeless tobacco: Never  Vaping Use   Vaping status: Never Used  Substance and Sexual Activity   Alcohol use: Yes    Comment: rarely   Drug use: Not Currently   Sexual activity: Not on file  Other Topics Concern   Not on file  Social History Narrative   Not on file   Social Drivers of Health   Financial Resource Strain: Not on file  Food Insecurity: No Food Insecurity (12/30/2023)   Hunger Vital Sign    Worried About Running Out of Food in the Last Year: Never true    Ran Out of Food in the Last Year: Never true  Transportation Needs: No Transportation Needs (12/30/2023)   PRAPARE - Administrator, Civil Service (Medical): No    Lack of Transportation (Non-Medical): No  Physical Activity: Not on file  Stress: Not on file  Social Connections: Patient Declined (12/30/2023)   Social Connection and Isolation Panel    Frequency of Communication with Friends and Family: Patient declined    Frequency of Social Gatherings with Friends and Family: Patient declined    Attends Religious Services: Patient declined    Database administrator or Organizations: Patient declined    Attends Banker Meetings: Patient declined    Marital Status: Patient declined  Intimate Partner Violence: Not At Risk (12/30/2023)   Humiliation, Afraid, Rape, and Kick questionnaire    Fear of Current or Ex-Partner: No    Emotionally Abused: No    Physically Abused: No    Sexually Abused: No    Family History:   Family History  Problem Relation Age of Onset   Rheumatic fever Mother    Heart disease Mother 31   Emphysema Brother    Heart attack Brother    Breast cancer Maternal Grandmother    Heart disease Brother    Diabetes Brother      ROS:  Please see the history of present illness.     Physical Exam/Data: Vitals:   12/30/23 0758 12/30/23 0815 12/30/23  1000 12/30/23 1241  BP:  (!) 108/51 109/61 (!) 108/46  Pulse:  86 81 84  Resp:  19 18 15   Temp: 98.7 F (37.1 C)   (!)  97.5 F (36.4 C)  TempSrc:    Axillary  SpO2:  100% 100% 100%  Weight:      Height:       No intake or output data in the 24 hours ending 12/30/23 1540    12/29/2023    2:52 PM 08/09/2023   10:00 AM 01/11/2023    3:08 PM  Last 3 Weights  Weight (lbs) 140 lb 140 lb 142 lb 2 oz  Weight (kg) 63.504 kg 63.504 kg 64.467 kg     Body mass index is 26.45 kg/m.  General: 78 y.o. Caucasian female resting comfortably in no acute distress. HEENT: Normocephalic and atraumatic. Sclera clear.  Neck: Supple. No JVD. Heart: Irregular irregular rhythm with normal rate. III/VI systolic murmur.  Lungs: No increased work of breathing. Clear to ausculation bilaterally. No wheezes, rhonchi, or rales.  Abdomen: Soft, non-distended, and non-tender to palpation.  Extremities: 2+ pitting edema of right lower extremity with erythema.  1+ pitting edema of the left lower extremity. Skin: Warm and dry. Neuro: No focal deficits. Psych: Normal affect. Responds appropriately.   EKG:  The EKG was personally reviewed and demonstrates:   Atrial fibrillation, rate 62 bpm, with LBBB and T wave inversions in leads I and aVL no significant changes compared to prior tracing.  Telemetry:  Telemetry was personally reviewed and demonstrates:  Atrial fibrillation with rates in the 60s to 70s.   Relevant CV Studies:  Echocardiogram 12/15/2022: Impressions:  1. Left ventricular ejection fraction, by estimation, is 50 to 55%. The  left ventricle has low normal function. The left ventricle has no regional  wall motion abnormalities. Left ventricular diastolic function could not  be evaluated.   2. Right ventricular systolic function is moderately reduced. The right  ventricular size is mildly enlarged.   3. The mitral valve is normal in structure. No evidence of mitral valve  regurgitation. No evidence  of mitral stenosis.   4. Tricuspid valve regurgitation is severe.   5. The aortic valve has been repaired/replaced. Aortic valve  regurgitation is mild to moderate. Possible stenosis of the bioprosthetic  valve. There is a 23 mm Sapien prosthetic (TAVR) valve present in the  aortic position. Aortic regurgitation PHT  measures 403 msec. Aortic valve mean gradient measures 23.5 mmHg. Aortic  valve Vmax measures 3.21 m/s.   6. The inferior vena cava is normal in size with greater than 50%  respiratory variability, suggesting right atrial pressure of 3 mmHg.    Laboratory Data: High Sensitivity Troponin:   Recent Labs  Lab 12/29/23 1537 12/29/23 1828  TROPONINIHS 12 12     Chemistry Recent Labs  Lab 12/29/23 1537 12/29/23 1550 12/29/23 2005 12/30/23 0639  NA 124* 127* 125* 125*  K 5.5* 5.5* 5.2* 4.7  CL 93*  --  94* 93*  CO2 21*  --  19* 20*  GLUCOSE 83  --  75 65*  BUN 32*  --  31* 28*  CREATININE 1.54*  --  1.43* 1.38*  CALCIUM 9.2  --  8.8* 8.5*  MG 2.0  --   --  1.8  GFRNONAA 34*  --  37* 39*  ANIONGAP 10  --  12 12    Recent Labs  Lab 12/29/23 1537 12/30/23 0639  PROT 8.7* 7.4  ALBUMIN 3.0* 2.6*  AST 35 29  ALT 10 9  ALKPHOS 74 60  BILITOT 2.4* 2.1*   Lipids No results for input(s): CHOL, TRIG, HDL, LABVLDL, LDLCALC, CHOLHDL in the last 168  hours.  Hematology Recent Labs  Lab 12/29/23 1537 12/29/23 1550 12/29/23 2005 12/30/23 0345  WBC 4.3  --   --  4.5  RBC 4.11  --  3.62* 3.80*  HGB 11.0* 12.9  --  10.2*  HCT 33.5* 38.0  --  30.8*  MCV 81.5  --   --  81.1  MCH 26.8  --   --  26.8  MCHC 32.8  --   --  33.1  RDW 18.3*  --   --  18.4*  PLT 174  --   --  165   Thyroid  Recent Labs  Lab 12/29/23 2005  TSH 19.514*  FREET4 1.26*    BNP Recent Labs  Lab 12/29/23 1537  BNP 639.2*    DDimer No results for input(s): DDIMER in the last 168 hours.  Radiology/Studies:  ECHOCARDIOGRAM COMPLETE Result Date: 12/30/2023     ECHOCARDIOGRAM REPORT   Patient Name:   URI COVEY Kimball Date of Exam: 12/30/2023 Medical Rec #:  [id number]      Height:       61.0 in Accession #:    [id number]     Weight:       140.0 lb Date of Birth:  [birth year]      BSA:          1.623 m Patient Age:    11 years       BP:           112/61 mmHg Patient Gender: F              HR:           82 bpm. Exam Location:  Inpatient Procedure: 2D Echo (Both Spectral and Color Flow Doppler were utilized during            procedure). Indications:    CHF  History:        Patient has prior history of Echocardiogram examinations. CHF;                 Aortic Valve Disease and Mitral Valve Disease.                 Aortic Valve: 23 mm Sapien prosthetic, stented (TAVR) valve is                 present in the aortic position.                 Mitral Valve: 25 mm St. Jude mechanical valve valve is present                 in the mitral position.  Sonographer:    Norleen Amour Referring Phys: [id number] ANASTASSIA DOUTOVA IMPRESSIONS  1. Left ventricular ejection fraction, by estimation, is 55 to 60%. The left ventricle has normal function. The left ventricle has no regional wall motion abnormalities. There is mild left ventricular hypertrophy. Left ventricular diastolic function could not be evaluated.  2. Right ventricular systolic function is moderately reduced. The right ventricular size is severely enlarged. There is severely elevated pulmonary artery systolic pressure. The estimated right ventricular systolic pressure is 76.8 mmHg.  3. Left atrial size was severely dilated.  4. Right atrial size was severely dilated.  5. The mitral valve has been repaired/replaced. No evidence of mitral valve regurgitation. The mean mitral valve gradient is 7.7 mmHg. There is a 25 mm St. Jude mechanical valve present in the mitral position.  6. Tricuspid valve regurgitation is severe.  7. The  aortic valve has been repaired/replaced. There is moderate calcification of the aortic valve. There is moderate  thickening of the aortic valve. Aortic valve regurgitation is mild. Moderate to severe aortic valve stenosis. There is a 23 mm Sapien prosthetic (TAVR) valve present in the aortic position.  8. The inferior vena cava is dilated in size with <50% respiratory variability, suggesting right atrial pressure of 15 mmHg. Comparison(s): Changes from prior study are noted. Aortic valve gradient has ranged from 15-20 mmHg since at least 2019 based on reports, with increase to 23.5 mmHg last year and now 33 mmHg. Visually TAVR leaflets are calcified and restricted. Stroke volume is low, which likely underestimates gradients. Suspect based on AVA and DI that TAVR has moderate to severe stenosis. FINDINGS  Left Ventricle: Left ventricular ejection fraction, by estimation, is 55 to 60%. The left ventricle has normal function. The left ventricle has no regional wall motion abnormalities. The left ventricular internal cavity size was normal in size. There is  mild left ventricular hypertrophy. Abnormal (paradoxical) septal motion consistent with post-operative status. Left ventricular diastolic function could not be evaluated due to mitral valve replacement. Left ventricular diastolic function could not be evaluated. Right Ventricle: The right ventricular size is severely enlarged. Right vetricular wall thickness was not well visualized. Right ventricular systolic function is moderately reduced. There is severely elevated pulmonary artery systolic pressure. The tricuspid regurgitant velocity is 3.93 m/s, and with an assumed right atrial pressure of 15 mmHg, the estimated right ventricular systolic pressure is 76.8 mmHg. Left Atrium: Left atrial size was severely dilated. Right Atrium: Right atrial size was severely dilated. Pericardium: There is no evidence of pericardial effusion. Mitral Valve: The mitral valve has been repaired/replaced. No evidence of mitral valve regurgitation. There is a 25 mm St. Jude mechanical valve present  in the mitral position. MV peak gradient, 17.6 mmHg. The mean mitral valve gradient is 7.7 mmHg. Tricuspid Valve: The tricuspid valve is normal in structure. Tricuspid valve regurgitation is severe. No evidence of tricuspid stenosis. Aortic Valve: Thickened/calcified leaflets of TAVR valve with restricted motion. The aortic valve has been repaired/replaced. There is moderate calcification of the aortic valve. There is moderate thickening of the aortic valve. Aortic valve regurgitation is mild. Aortic regurgitation PHT measures 550 msec. Moderate to severe aortic stenosis is present. Aortic valve mean gradient measures 33.0 mmHg. Aortic valve peak gradient measures 45.9 mmHg. Aortic valve area, by VTI measures 0.72 cm. There is a 23 mm Sapien prosthetic, stented (TAVR) valve present in the aortic position. Pulmonic Valve: The pulmonic valve was grossly normal. Pulmonic valve regurgitation is mild. No evidence of pulmonic stenosis. Aorta: The aortic root, ascending aorta, aortic arch and descending aorta are all structurally normal, with no evidence of dilitation or obstruction. Venous: The inferior vena cava is dilated in size with less than 50% respiratory variability, suggesting right atrial pressure of 15 mmHg. IAS/Shunts: The atrial septum is grossly normal.  LEFT VENTRICLE PLAX 2D LVIDd:         4.50 cm LVIDs:         3.00 cm LV PW:         1.10 cm LV IVS:        1.20 cm LVOT diam:     2.10 cm LV SV:         58 LV SV Index:   36 LVOT Area:     3.46 cm  LV Volumes (MOD) LV vol d, MOD A2C: 166.0 ml LV vol d,  MOD A4C: 101.0 ml LV vol s, MOD A2C: 62.3 ml LV vol s, MOD A4C: 46.4 ml LV SV MOD A2C:     103.7 ml LV SV MOD A4C:     101.0 ml LV SV MOD BP:      87.5 ml RIGHT VENTRICLE            IVC RV Basal diam:  5.00 cm    IVC diam: 2.20 cm RV S prime:     8.05 cm/s TAPSE (M-mode): 1.5 cm LEFT ATRIUM             Index        RIGHT ATRIUM           Index LA diam:        5.90 cm 3.64 cm/m   RA Area:     27.80 cm LA  Vol (A2C):   91.0 ml 56.07 ml/m  RA Volume:   101.00 ml 62.23 ml/m LA Vol (A4C):   57.1 ml 35.18 ml/m LA Biplane Vol: 76.3 ml 47.01 ml/m  AORTIC VALVE                     PULMONIC VALVE AV Area (Vmax):    0.68 cm      PV Vmax:       1.40 m/s AV Area (Vmean):   0.68 cm      PV Peak grad:  7.8 mmHg AV Area (VTI):     0.72 cm AV Vmax:           338.80 cm/s AV Vmean:          235.600 cm/s AV VTI:            0.804 m AV Peak Grad:      45.9 mmHg AV Mean Grad:      33.0 mmHg LVOT Vmax:         66.37 cm/s LVOT Vmean:        46.500 cm/s LVOT VTI:          0.168 m LVOT/AV VTI ratio: 0.21 AI PHT:            550 msec  AORTA Ao Root diam: 2.70 cm Ao Asc diam:  2.80 cm MITRAL VALVE               TRICUSPID VALVE MV Area (PHT): 2.27 cm    TR Peak grad:   61.8 mmHg MV Area VTI:   1.85 cm    TR Vmax:        393.00 cm/s MV Peak grad:  17.6 mmHg MV Mean grad:  7.7 mmHg    SHUNTS MV Vmax:       2.10 m/s    Systemic VTI:  0.17 m MV Vmean:      122.4 cm/s  Systemic Diam: 2.10 cm Shelda Bruckner MD Electronically signed by Shelda Bruckner MD Signature Date/Time: 12/30/2023/1:41:41 PM    Final    IR Paracentesis Result Date: 12/30/2023 INDICATION: Hx of NASH/MASH cirrhosis with ascites. Consult for her first diagnostic and therapeutic paracentesis. EXAM: ULTRASOUND GUIDED DIAGNOSTIC AND THERAPEUTIC PARACENTESIS MEDICATIONS: 7 mL 1% lidocaine COMPLICATIONS: None immediate. PROCEDURE: Informed written consent was obtained from the patient after a discussion of the risks, benefits and alternatives to treatment. A timeout was performed prior to the initiation of the procedure. Initial ultrasound scanning demonstrates a large amount of ascites within the right lower abdominal quadrant. The right lower abdomen was prepped and draped in the  usual sterile fashion. 1% lidocaine was used for local anesthesia. Following this, a 19 gauge, 7-cm, Yueh catheter was introduced. An ultrasound image was saved for documentation  purposes. The paracentesis was performed. The catheter was removed and a dressing was applied. The patient tolerated the procedure well without immediate post procedural complication. Patient received post-procedure intravenous albumin; see nursing notes for details. FINDINGS: A total of approximately 10 liters of hazy dark yellow fluid was removed. Samples were sent to the laboratory as requested by the clinical team. IMPRESSION: Successful ultrasound-guided paracentesis yielding 10 liters of peritoneal fluid. Performed by: Kimble Clas, PA-C Electronically Signed   By: Cordella Banner   On: 12/30/2023 12:35   CT ABDOMEN PELVIS WO CONTRAST Result Date: 12/29/2023 CLINICAL DATA:  Acute abdominal pain.  Fatigue.  Swelling. EXAM: CT ABDOMEN AND PELVIS WITHOUT CONTRAST TECHNIQUE: Multidetector CT imaging of the abdomen and pelvis was performed following the standard protocol without IV contrast. RADIATION DOSE REDUCTION: This exam was performed according to the departmental dose-optimization program which includes automated exposure control, adjustment of the mA and/or kV according to patient size and/or use of iterative reconstruction technique. COMPARISON:  Abdominal ultrasound 08/18/2023 FINDINGS: Lower chest: Small left and trace right pleural effusion. The heart is enlarged. Circumferential left atrial calcifications. Mitral valve replacement, aortic valve replacement. Hepatobiliary: Hepatic cirrhosis with nodular contours. No obvious focal liver lesion on this unenhanced exam. Calcified gallstones within decompressed gallbladder. Pancreas: No ductal dilatation or inflammation. Spleen: No splenomegaly. The spleen is small in size measuring 7.8 cm in length. Adrenals/Urinary Tract: No adrenal nodule. Bilateral renal parenchymal thinning. No hydronephrosis. No renal calculi. Urinary bladder is only minimally distended. Stomach/Bowel: Bowel assessment is limited due to the presence of ascites. The stomach is  nondistended. No small bowel obstruction or abnormal distention. The appendix is not confidently visualized. Small volume of formed stool in the colon. Short segment of transverse colon extends into a umbilical hernia. No associated wall thickening or obstruction. Sigmoid colonic diverticulosis without diverticulitis. Vascular/Lymphatic: Aortic atherosclerosis. No aortic aneurysm. Suspected retroperitoneal collaterals. Ascites limits assessment for adenopathy, no gross bulky enlarged nodes. Reproductive: Uterus and bilateral adnexa are unremarkable. Other: Large volume abdominopelvic ascites. Ascites extends into an umbilical hernia. Ascitic fluid appears simple. There is generalized body wall edema. No free air. Musculoskeletal: Left hip arthroplasty. Lower lumbar facet hypertrophy. IMPRESSION: 1. Hepatic cirrhosis with large volume abdominopelvic ascites. 2. Cholelithiasis. 3. Short segment of transverse colon extends into a umbilical hernia. No associated wall thickening or obstruction. 4. Sigmoid colonic diverticulosis without diverticulitis. 5. Small left and trace right pleural effusions. Generalized body wall edema. Aortic Atherosclerosis (ICD10-I70.0). Electronically Signed   By: Andrea Gasman M.D.   On: 12/29/2023 17:24   CT Head Wo Contrast Result Date: 12/29/2023 EXAM: CT HEAD WITHOUT CONTRAST 12/29/2023 04:24:56 PM TECHNIQUE: CT of the head was performed without the administration of intravenous contrast. Automated exposure control, iterative reconstruction, and/or weight based adjustment of the mA/kV was utilized to reduce the radiation dose to as low as reasonably achievable. COMPARISON: None available. CLINICAL HISTORY: Mental status change, unknown cause. Table formatting from the original note was not included.; Pt BIB [facility] from home c/o confusion, fatigue, and swelling. FINDINGS: BRAIN AND VENTRICLES: No acute hemorrhage. No evidence of acute infarct. No hydrocephalus. No extra-axial  collection. No mass effect or midline shift. Remote right cerebellar infarct. Patchy white matter hyperintensities, compatible with chronic small vessel ischemic change. ORBITS: No acute abnormality. SINUSES: No acute abnormality. SOFT TISSUES AND SKULL: No  acute soft tissue abnormality. No skull fracture. IMPRESSION: 1. No acute intracranial abnormality. 2. 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 Electronically signed by: Gilmore Molt MD 12/29/2023 05:11 PM EDT RP Workstation: [id number]   DG Chest Port 1 View Result Date: 12/29/2023 CLINICAL DATA:  Altered mental status. EXAM: PORTABLE CHEST 1 VIEW COMPARISON:  Aug 12, 2020. FINDINGS: Stable cardiomegaly. Sternotomy wires are noted. Status post transcatheter aortic valve repair. No acute pulmonary disease is noted. Bony thorax is unremarkable. IMPRESSION: No acute abnormality seen. Electronically Signed   By: Lynwood Landy Raddle M.D.   On: 12/29/2023 16:58     Assessment and Plan:  Ascites Right Heart Failure  Cirrhosis Patient has a history of right heart failure and cirrhosis felt to be due to [facility].  She presented to the ED with confusion, fatigue, and edema.  She was noted to have severe hyponatremia with sodium level of 124.  BNP was elevated in the 600s.  No edema noted on chest x-ray.  Abdominal/pelvic CT showed hepatic cirrhosis with large volume abdominopelvic ascites and small left and trace right pleural effusions as well as generalized body wall edema. Echo this admission showed  LVEF of 50-60% with mild LVH as well as severely enlarged RV with moderately reduced RV function and severely elevated RVSP of 76.8 mmHg. She has been started on IV Lasix and underwent a paracentesis today with removal of 10 L of dark hazy yellow fluid. - Abdomen is soft of paracentesis earlier today. She continues to have some lower extremity edema (right > left).  - She has been started on IV Lasix 80mg  twice daily.  -  Continue Spironolactone  25mg  daily.  - Volume overload likely a combination of her right heart failure and cirrhosis. Will continue diuresis as above. GI following and helping with her cirrhosis. They have started Lactulose and Rifaximin given possible hepatic encephalopathy and are checking AFP for HCC screening.  Permanent Atrial Fibrillation Rate controlled.  - Continue Toprol -XL 25mg  daily. - On chronic anticoagulation with Coumadin  at home given mechanical valve. Continue IV Heparin for now given need for possible cath next week. Can restart Coumadin  when it is clear no additional procedure will be needed.  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 Echo this admission showed LVEF of 50-60% with mild LVH, severely enlarged RV with moderately reduced RV function and severely elevated RVSP of 76.8 mmHg, severe biatrial enlargement, mechanical mitral valve replacement with mean gradient of 7.7 mmHg, moderate to severe stenosis of TAVR with mean gradient of 33 mmHg, and severe TR. - Continue diuresis for right sided heart failure as above. Will diuresis over the weekend and then can consider a R/ LHC on Monday. However, unclear whether she would even benefit from a valve in valve surgery even if she were a candidate given her RV failure.   Hypertension BP stable.  - Continue diuresis and Toprol -XL as above.  CKD Stage IIIb Creatinine 1.54 on admission. Baseline around 1.3 to 1.5. - Improved to 1.38 today. - Continue to monitor.   Hyponatremia Patient has intermittent had problems with hyponatremia before.Sodium 124 on admission. Serum osmolality 285.  Urine osmolality 332 and urine sodium 57. Felt to be secondary to hypervolemia. - Sodium 125  today. - Continue diuresis as above.  Hyperkalemia Potassium 5.5 on admission.  - Improved with Lokelma. Potassium 4.7 today. - Home Spironolactone  was reduced to 25mg  daily. - Continue to monitor.  Otherwise, per primary team: - Cellulitis - Hypothyroidism - Anemia  - Physical deconditioning    Risk Assessment/Risk Scores:   CHA2DS2-VASc Score = 8   This indicates a 10.8% annual risk of stroke. The patient's score is based upon: CHF History: 1 HTN History: 1 Diabetes History: 0 Stroke History: 2 (remote infarct noted on head CT) Vascular Disease History: 1 (aortic atherosclerosis noted on CT) Age Score: 2 Gender Score: 1       For questions or updates, please contact  [facility] Please consult [email or web address] for contact info under      Signed, Jahlen Bollman E Rayman Petrosian, PA-C  12/30/2023 3:40 PM

## 2023-12-30 NOTE — ED Notes (Signed)
 PT at bedside.

## 2023-12-30 NOTE — Progress Notes (Signed)
 PHARMACY - ANTICOAGULATION CONSULT NOTE  Pharmacy Consult for Heparin Indication: mechanical mitral valve and subtherapeutic INR on warfarin  No Known Allergies  Patient Measurements: Height: 5' 1 (154.9 cm) Weight: 63.5 kg (140 lb) IBW/kg (Calculated) : 47.8 HEPARIN DW (KG): 60.9  Vital Signs: Temp: 97.5 F (36.4 C) (10/10 1241) Temp Source: Axillary (10/10 1241) BP: 108/46 (10/10 1241) Pulse Rate: 84 (10/10 1241)  Labs: Recent Labs    12/29/23 1537 12/29/23 1550 12/29/23 1828 12/29/23 2005 12/30/23 0345 12/30/23 0639 12/30/23 1553  HGB 11.0* 12.9  --   --  10.2*  --   --   HCT 33.5* 38.0  --   --  30.8*  --   --   PLT 174  --   --   --  165  --   --   LABPROT 19.0*  --   --   --  20.2*  --   --   INR 1.5*  --   --   --  1.6*  --   --   HEPARINUNFRC  --   --   --   --   --  0.28* 0.36  CREATININE 1.54*  --   --  1.43*  --  1.38*  --   TROPONINIHS 12  --  12  --   --   --   --    Estimated Creatinine Clearance: 28.2 mL/min (A) (by C-G formula based on SCr of 1.38 mg/dL (H)).  Medical History: Past Medical History:  Diagnosis Date   Atrial fibrillation (HCC)    Heart disease    aortic and mitral valve problems   Hepatic cirrhosis (HCC)    Hypothyroidism     Medications:  Scheduled:   furosemide  80 mg Intravenous BID   lactulose  20 g Oral BID   [START ON 12/31/2023] levothyroxine  50 mcg Intravenous Daily   metoprolol  succinate  25 mg Oral Daily   ondansetron (ZOFRAN) IV  4 mg Intravenous Once   rifaximin  550 mg Oral BID   sodium chloride flush  3 mL Intravenous Q12H   spironolactone   25 mg Oral Daily  Infusions:   sodium chloride     heparin 950 Units/hr (12/30/23 1632)    Assessment: Patient is a 78 YO F presenting with abdominal distension, peripheral edema, and fatigue. 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 According to last anticoag clinic note on 9/25, warfarin regimen was  one-half 5 mg tablet daily and INR was 3 at that time.   Initial INR subtherapeutic at 1.5; cardiology recommending bridge with heparin drip without bolus until the INR reaches at least 2.0. Pharmacy consulted to dose heparin drip.   Heparin level therapeutic at 0.36. HgB 10.2 and PLTs 165. INR this AM remains subtherapeutic at 1.6, warfarin on hold for possible R/L cardiac cath on Monday 10/13. No issues with heparin infusion or bleeding reported.   Goal of Therapy:  Heparin level 0.3-0.7 units/ml INR 2.5-3.5 Monitor platelets by anticoagulation protocol: Yes   Plan:  Continue Heparin infusion at 950 units/hr.  Check confirmatory heparin level in 8 hours at 23:30  Warfarin on hold Monitor heparin levels, CBC, INR daily.   Thank you for allowing pharmacy to be part of this patients care team.  Rocky Slade, PharmD, BCPS Clinical Pharmacist 12/30/2023 5:14 PM  Please check AMION for all [facility] Pharmacy phone numbers After 10:00 PM, call Main Pharmacy [phone]

## 2023-12-30 NOTE — Discharge Instructions (Addendum)
 [facility] [address] [place], [place], [address] [phone] *Hedda will contact you within 2 days of your hospital discharge to schedule home physical therapy.       High-Calorie, High-Protein Nutrition Therapy (2021) A high-calorie, high-protein diet has been recommended to you. Your registered dietitian nutritionist (RDN) may have recommended this diet because you are having difficulty eating enough calories throughout the day, you have lost weight, and/or you need to add protein to your diet. Sometimes you may not feel like eating, even if you know the importance of good nutrition. The recommendations in this handout can help you with the following: Regaining your strength and energy Keeping your body healthy Healing and recovering from surgery or illness and fighting infection Tips: Schedule Your Meals and Snacks Several small meals and snacks are often better tolerated and digested than large meals. Strategies Plan to eat 3 meals and 3 snacks daily. Experiment with timing meals to find out when you have a larger appetite. Appetite may be greatest in the morning after not eating all night so you may prefer to eat your larger meals and snacks in the morning and at lunch. Breakfast-type foods are often better tolerated so eat foods such as eggs, pancakes, waffles and cereal for any meal or snack. Carry snacks with you so you are prepared to eat every 2 to 3 hours. Determine what works best for you if your body's cues for feeling hungry or full are not working. Eat a small meal or snack even if you don't feel hungry. Set a timer to remind you when it is time to eat. Take a walk before you eat (with health care provider's approval). Light or moderate physical activity can help you maintain muscle and increase your appetite. Make Eating Enjoyable Taking steps to make the experience enjoyable may help to increase your interest in eating and improve your  appetite. Strategies: Eat with others whenever possible. Include your favorite foods to make meals more enjoyable. Try new foods. Save your beverage for the end of the meal so that you have more room for food before you get full. Add Calories to Your Meals and Snacks Try adding calorie-dense foods so that each bite provides more nutrition. Strategies Drink milk, chocolate milk, soy milk, or smoothies instead of low-calorie beverages such as diet drinks or water. Cook with milk or soy milk instead of water when making dishes such as hot cereal, cocoa, or pudding. Add jelly, jam, honey, butter or margarine to bread and crackers. Add jam or fruit to ice cream and as a topping over cake. Mix dried fruit, nuts, granola, honey, or dry cereal with yogurt or hot cereals. Enjoy snacks such as milkshakes, smoothies, pudding, ice cream, or custard. Blend a fruit smoothie of a banana, frozen berries, milk or soy milk, and 1 tablespoon nonfat powdered milk or protein powder. Add Protein to Your Meals and Snacks Choose at least one protein food at each meal and snack to increase your daily intake. Strategies Add  cup nonfat dry milk powder or protein powder to make a high-protein milk to drink or to use in recipes that call for milk. Vanilla or peppermint extract or unsweetened cocoa powder could help to boost the flavor. Add hard-cooked eggs, leftover meat, grated cheese, canned beans or tofu to noodles, rice, salads, sandwiches, soups, casseroles, pasta, tuna and other mixed dishes. Add powdered milk or protein powder to hot cereals, meatloaf, casseroles, scrambled eggs, sauces, cream soups, and shakes. Add  beans and lentils to salads, soups, casseroles, and vegetable dishes. Eat cottage cheese or yogurt, especially Greek yogurt, with fruit as a snack or dessert. Eat peanut or other nut butters on crackers, bread, toast, waffles, apples, bananas or celery sticks. Add it to milkshakes, smoothies, or  desserts. Consider a ready-made protein shake. Your RDN will make recommendations. Add Fats to Your Meals and Snacks Try adding fats to your meals and snacks. Fat provides more calories in fewer bites than carbohydrate or protein and adds flavors to your foods. Strategies Snack on nuts and seeds or add them to foods like salads, pasta, cereals, yogurt, and ice cream.  Saut or stir-fry vegetables, meats, chicken, fish or tofu in olive or canola oil.  Add olive oil, other vegetable oils, butter or margarine to soups, vegetables, potatoes, cooked cereal, rice, pasta, bread, crackers, pancakes, or waffles. Snack on olives or add to pasta, pizza, or salad. Add avocado or guacamole to your salads, sandwiches, and other entrees. Include fatty fish such as salmon in your weekly meal plan. For general food safety tips, especially for clients with immunocompromised conditions, ask your RDN for the Food Safety Nutrition Therapy handout. Small Meal and Snack Ideas These snacks and meals are recommended when you have to eat but aren't necessarily hungry.  They are good choices because they are high in protein and high in calories.  2 graham crackers 2 tablespoons peanut or other nut butter 1 cup milk 2 slices whole wheat toast topped with:  avocado, mashed Seasoning of your choice   cup Greek yogurt  cup fruit  cup granola 2 deviled egg halves 5 whole wheat crackers  1 cup cream of tomato soup  grilled cheese sandwich 1 toasted waffle topped with: 2 tablespoons peanut or nut butter 1 tablespoon jam  Trail mix made with:  cup nuts  cup dried fruit  cup cold cereal, any variety  cup oatmeal or cream of wheat cereal 1 tablespoon peanut or nut butter  cup diced fruit   High-Calorie, High-Protein Sample 1-Day Menu View Nutrient Info Breakfast 1 egg, scrambled 1 ounce cheddar cheese 1 English muffin, whole wheat 1 tablespoon margarine 1 tablespoon jam  cup orange juice, fortified with  calcium and vitamin D  Morning Snack 1 tablespoon peanut butter 1 banana 1 cup 1% milk  Lunch Tuna salad sandwich made with: 2 slices bread, whole wheat 3 ounces tuna mixed with: 1 tablespoon mayonnaise  cup pudding  Afternoon Snack  cup hummus  cup carrots 1 pita  Evening Meal Enchilada casserole made with: 2 corn tortillas 3 ounces ground beef, cooked  cup black beans, cooked  cup corn, cooked 1 ounce grated cheddar cheese  cup enchilada sauce  avocado, sliced, topping for enchilada 1 tablespoon sour cream, topping for enchilada Salad:  cup lettuce, shredded  cup tomatoes, chopped, for salad 1 tablespoon olive oil and vinegar dressing, for salad  Evening Snack  cup Greek yogurt  cup blueberries  cup granola

## 2023-12-30 NOTE — ED Notes (Addendum)
 Pt getting Heparin drip. Nurse will collect morning labs

## 2023-12-30 NOTE — Assessment & Plan Note (Signed)
 Mild no EKG changes will hold spironolactone 

## 2023-12-30 NOTE — Evaluation (Signed)
 Physical Therapy Evaluation Patient Details Name: Chelsea Boyer MRN: [id number] DOB: February 03, 1946 Today's Date: 12/30/2023  History of Present Illness  Pt is a 78 y.o female presenting to the ED 10/9 for hyponatremia and volume overload related to cirrhosis. EFY:rpmmyndpd, CHF, CKD, hypothyroidism, s/p TAVR  Clinical Impression  Pt presents with condition above and deficits mentioned below, see PT Problem List. PTA, she was independent without DME, living with her husband in a 1-level house with 3-7 STE. Currently, the pt is A&Ox4, demonstrating deficits in balance, gross strength, power, and activity tolerance. She has noticeable edema in her trunk and legs and is limited by R hip pain. She denies any falls or known injuries to the R hip. She is currently requiring mod-maxA for bed mobility, but would likely do better on a regular bed rather than the stretcher she is currently on. She was able to transfer to stand with CGA and ambulating up to ~20 ft slowly with an antalgic, step-to gait pattern with a RW and minA. She is at risk for falls, but I anticipate she will progress quickly provided her R hip pain continues to improve and she mobilizes frequently. Thus, am hopeful pt can d/c home with family support and HHPT once medically cleared. Pt was unsure how much physical assistance her husband can provide, but did report he was available 24/7. Will continue to follow acutely.        If plan is discharge home, recommend the following: A little help with walking and/or transfers;A little help with bathing/dressing/bathroom;Assistance with cooking/housework;Assist for transportation;Help with stairs or ramp for entrance   Can travel by private vehicle        Equipment Recommendations Rolling walker (2 wheels);BSC/3in1  Recommendations for Other Services       Functional Status Assessment Patient has had a recent decline in their functional status and demonstrates the ability to make significant  improvements in function in a reasonable and predictable amount of time.     Precautions / Restrictions Precautions Precautions: Fall Precaution/Restrictions Comments: watch BP Restrictions Weight Bearing Restrictions Per Provider Order: No      Mobility  Bed Mobility Overal bed mobility: Needs Assistance Bed Mobility: Supine to Sit, Sit to Supine     Supine to sit: Mod assist, HOB elevated Sit to supine: Max assist, HOB elevated   General bed mobility comments: Pt needed cues for sequencing bed mobility. Pt needed minA to assist R leg off R EOB due to R hip pain. ModA needed to ascend trunk with pt pulling up on therapist's hands. MaxA needed to return to supine for pt safety due to her short stature and sitting proximally on edge of stretcher with poor ability to scoot posteriorly more onto the bed prior to laying down.    Transfers Overall transfer level: Needs assistance Equipment used: Rolling walker (2 wheels) Transfers: Sit to/from Stand Sit to Stand: Contact guard assist           General transfer comment: Pt able to stand from edge of stretcher (high compared to most surfaces even though at lowest height it could go) with bil hands on RW, CGA for safety    Ambulation/Gait Ambulation/Gait assistance: Min assist Gait Distance (Feet): 20 Feet Assistive device: Rolling walker (2 wheels) Gait Pattern/deviations: Step-to pattern, Decreased stance time - right, Decreased step length - left, Decreased weight shift to right, Antalgic, Trunk flexed, Narrow base of support Gait velocity: reduced Gait velocity interpretation: <1.31 ft/sec, indicative of household ambulator  General Gait Details: Pt takes slow, small, antalgic steps, ambulating with a step-to pattern due to decreased R weight shift and stance time due to R hip pain thus decreased L step length. MinA intermittently needed for balance and RW management, cuing pt to sequence feet and RW especially when turning.  Cues provided to offload R leg through pushing down on RW during R stance phase.  Stairs            Wheelchair Mobility     Tilt Bed    Modified Rankin (Stroke Patients Only)       Balance Overall balance assessment: Needs assistance Sitting-balance support: No upper extremity supported, Feet supported Sitting balance-Leahy Scale: Good     Standing balance support: Bilateral upper extremity supported, During functional activity, Reliant on assistive device for balance Standing balance-Leahy Scale: Poor Standing balance comment: reliant on RW and up to minA                             Pertinent Vitals/Pain Pain Assessment Pain Assessment: 0-10 Pain Score: 7  Pain Location: back, R hip Pain Descriptors / Indicators: Discomfort, Sore, Grimacing, Guarding Pain Intervention(s): Limited activity within patient's tolerance, Monitored during session, Repositioned    Home Living Family/patient expects to be discharged to:: Private residence Living Arrangements: Spouse/significant other Available Help at Discharge: Family;Available 24 hours/day Type of Home: House Home Access: Stairs to enter Entrance Stairs-Rails: Right;Left (cannot reach both on front but can reach both at back) Entrance Stairs-Number of Steps: 3 (in front, 6-7 in back)   Home Layout: One level Home Equipment: None      Prior Function Prior Level of Function : Independent/Modified Independent;Driving             Mobility Comments: No AD, no falls in past 6 months ADLs Comments: retired     Extremity/Trunk Assessment   Upper Extremity Assessment Upper Extremity Assessment: Right hand dominant;Defer to OT evaluation    Lower Extremity Assessment Lower Extremity Assessment: Generalized weakness;RLE deficits/detail;LLE deficits/detail RLE Deficits / Details: grossly >/= 4/5 MMT; denied numbness/tingling; pitting edema 2+; erythema noted lower leg LLE Deficits / Details: grossly  >/= 4/5 MMT; denied numbness/tingling; pitting edema 3+    Cervical / Trunk Assessment Cervical / Trunk Assessment: Other exceptions Cervical / Trunk Exceptions: hernia noted central upper abdomen; edema at trunk  Communication   Communication Communication: No apparent difficulties    Cognition Arousal: Alert Behavior During Therapy: WFL for tasks assessed/performed   PT - Cognitive impairments: No family/caregiver present to determine baseline                       PT - Cognition Comments: A&Ox4, pt aware her husband believed her to have AMS but feels she is back to her baseline. Follows cues well, but slow to problem solve, unsure if baseline or not. Following commands: Intact       Cueing Cueing Techniques: Verbal cues     General Comments General comments (skin integrity, edema, etc.): VSS on RA, except monitor alarming VTach at a point but not noted when looking at rhythm; educated pt on importance of frequent mobility to d/c home    Exercises     Assessment/Plan    PT Assessment Patient needs continued PT services  PT Problem List Decreased strength;Decreased activity tolerance;Decreased mobility;Decreased balance;Decreased knowledge of use of DME       PT Treatment Interventions DME instruction;Stair  training;Gait training;Functional mobility training;Therapeutic activities;Therapeutic exercise;Balance training;Neuromuscular re-education;Patient/family education    PT Goals (Current goals can be found in the Care Plan section)  Acute Rehab PT Goals Patient Stated Goal: to improve and go home PT Goal Formulation: With patient Time For Goal Achievement: 01/13/24 Potential to Achieve Goals: Good    Frequency Min 2X/week     Co-evaluation               AM-PAC PT 6 Clicks Mobility  Outcome Measure Help needed turning from your back to your side while in a flat bed without using bedrails?: A Lot Help needed moving from lying on your back to  sitting on the side of a flat bed without using bedrails?: A Lot Help needed moving to and from a bed to a chair (including a wheelchair)?: A Little Help needed standing up from a chair using your arms (e.g., wheelchair or bedside chair)?: A Little Help needed to walk in hospital room?: A Little Help needed climbing 3-5 steps with a railing? : Total 6 Click Score: 14    End of Session Equipment Utilized During Treatment: Gait belt Activity Tolerance: Patient tolerated treatment well Patient left: in bed;with call bell/phone within reach Nurse Communication: Mobility status PT Visit Diagnosis: Unsteadiness on feet (R26.81);Other abnormalities of gait and mobility (R26.89);Muscle weakness (generalized) (M62.81);Difficulty in walking, not elsewhere classified (R26.2)    Time: 9192-9161 PT Time Calculation (min) (ACUTE ONLY): 31 min   Charges:   PT Evaluation $PT Eval Moderate Complexity: 1 Mod PT Treatments $Therapeutic Activity: 8-22 mins PT General Charges $$ ACUTE PT VISIT: 1 Visit         Theo Ferretti, PT, DPT Acute Rehabilitation Services  Office: [phone]   Theo CHRISTELLA Ferretti 12/30/2023, 8:51 AM

## 2023-12-30 NOTE — Progress Notes (Signed)
   12/30/23 0944  TOC Brief Assessment  Insurance and Status Reviewed  Patient has primary care physician Yes  Home environment has been reviewed From home c/husband  Prior level of function: Independent  Prior/Current Home Services No current home services  Social Drivers of Health Review SDOH reviewed no interventions necessary  Readmission risk has been reviewed Yes  Transition of care needs transition of care needs identified, TOC will continue to follow   Pt will dc c/HHPT c/[place], AVS updated.  Spoke c/pt and her husband about getting a walker. Husband stated that he will get a walker prior to pt's dc.

## 2023-12-30 NOTE — Procedures (Signed)
 PROCEDURE SUMMARY:  Successful image-guided paracentesis from the right lower abdomen.  Yielded 10 liters of hazy dark yellow fluid.  No immediate complications.  EBL: trace Patient tolerated well.   Specimen was sent for labs.  Please see imaging section of Epic for full dictation.  Kimble DEL Blessings Inglett PA-C 12/30/2023 11:30 AM

## 2023-12-30 NOTE — Progress Notes (Addendum)
 PROGRESS NOTE    Chelsea Boyer  FMW:[id number] DOB: [birth year] DOA: 12/29/2023 PCP: Chrystal Lamarr RAMAN, MD  Chronically ill 78/F with history of [place]/cirrhosis, esophageal varices, diastolic CHF, RV failure, paroxysmal A-fib on Coumadin , history of mitral valve replacement, CKD, hypothyroidism, chronic anemia, history of TAVR presented to the ED with abdominal distention, increased confusion fatigue and swelling.   In the ED she was noted to be in A-fib, CT abdomen pelvis noted bilateral pleural effusions, cirrhotic liver, large volume ascites, anasarca. 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 -For large-volume paracentesis today - Continue IV Lasix increased dose to 80 mg twice daily, add Aldactone  - Continue lactulose, rifaximin, ammonia level was not significantly elevated  Acute on chronic diastolic CHF, moderate RV failure Pulmonary hypertension History of valvular heart disease - Last echo 9/24 with EF 50-55, moderately reduced RV, TAVR - Diuretics as noted above - May be a poor candidate for SGLT2i - Follow-up repeat echo  Permanent atrial fibrillation - On warfarin at baseline, restart, continue IV heparin now  Hyponatremia - Secondary to hypervolemia, monitor with diuresis  Severe hypothyroidism - TSH significantly elevated at 19, T4 in acceptable range - Reports compliance with Synthroid, I suspect bowel edema could be contributing to lower absorption - Change Synthroid to IV for few days  CKD 3A Stable, monitor  History of mechanical MVR - Continue IV heparin, resume Coumadin  - INR subtherapeutic on insulin  History of TAVR in 2018     DVT prophylaxis: IV  heparin Code Status: Full code Family Communication: No family at bedside at this time Disposition Plan: Home pending clinical improvement s:    Objective: Vitals:   12/30/23 0700 12/30/23 0758 12/30/23 0815 12/30/23 1000  BP: 112/61  (!) 108/51 109/61  Pulse: 74  86 81  Resp: 18  19 18   Temp:  98.7 F (37.1 C)    TempSrc:      SpO2: 100%  100% 100%  Weight:      Height:       No intake or output data in the 24 hours ending 12/30/23 1125 Filed Weights   12/29/23 1452  Weight: 63.5 kg    Examination:  General exam: Chronically ill frail cachectic laying in bed, AAO x 2, mild cognitive deficits HEENT: Positive JVD CVS: S1-S2, irregular rhythm Lungs: Decreased breath sounds the bases Abdomen: Massively distended, positive fluid thrill, nontender Extremities: 1+ edema Data Reviewed:   CBC: Recent Labs  Lab 12/29/23 1537 12/29/23 1550 12/30/23 0345  WBC 4.3  --  4.5  NEUTROABS 3.0  --   --   HGB 11.0* 12.9 10.2*  HCT 33.5* 38.0 30.8*  MCV 81.5  --  81.1  PLT 174  --  165   Basic Metabolic Panel: Recent Labs  Lab 12/29/23 1537 12/29/23 1550 12/29/23 2005 12/30/23 0639  NA 124* 127* 125* 125*  K 5.5* 5.5* 5.2* 4.7  CL 93*  --  94* 93*  CO2 21*  --  19* 20*  GLUCOSE 83  --  75 65*  BUN 32*  --  31* 28*  CREATININE 1.54*  --  1.43* 1.38*  CALCIUM 9.2  --  8.8* 8.5*  MG 2.0  --   --  1.8  PHOS  --   --   --  3.4   GFR: Estimated Creatinine Clearance: 28.2 mL/min (A) (by C-G formula based on SCr of 1.38 mg/dL (H)). Liver Function Tests: Recent Labs  Lab 12/29/23 1537 12/30/23 0639  AST 35 29  ALT 10 9  ALKPHOS 74 60  BILITOT 2.4* 2.1*  PROT 8.7* 7.4  ALBUMIN 3.0* 2.6*   No results for input(s): LIPASE, AMYLASE in the last 168 hours. Recent Labs  Lab 12/29/23 1537  AMMONIA 15   Coagulation Profile: Recent Labs  Lab 12/29/23 1537 12/30/23 0345  INR 1.5* 1.6*   Cardiac Enzymes: No results for input(s): CKTOTAL, CKMB,  CKMBINDEX, TROPONINI in the last 168 hours. BNP (last 3 results) No results for input(s): PROBNP in the last 8760 hours. HbA1C: No results for input(s): HGBA1C in the last 72 hours. CBG: No results for input(s): GLUCAP in the last 168 hours. Lipid Profile: No results for input(s): CHOL, HDL, LDLCALC, TRIG, CHOLHDL, LDLDIRECT in the last 72 hours. Thyroid Function Tests: Recent Labs    12/29/23 2005  TSH 19.514*  FREET4 1.26*   Anemia Panel: Recent Labs    12/29/23 2005 12/30/23 0345  VITAMINB12  --  1,469*  FOLATE  --  10.1  FERRITIN 251  --   TIBC 372  --   IRON 39  --   RETICCTPCT 1.4  --    Urine analysis:    Component Value Date/Time   COLORURINE YELLOW 12/29/2023 1900   APPEARANCEUR CLEAR 12/29/2023 1900   LABSPEC 1.009 12/29/2023 1900   PHURINE 5.0 12/29/2023 1900   GLUCOSEU NEGATIVE 12/29/2023 1900   HGBUR NEGATIVE 12/29/2023 1900   BILIRUBINUR NEGATIVE 12/29/2023 1900   KETONESUR NEGATIVE 12/29/2023 1900   PROTEINUR NEGATIVE 12/29/2023 1900   NITRITE NEGATIVE 12/29/2023 1900   LEUKOCYTESUR NEGATIVE 12/29/2023 1900   Sepsis Labs: @LABRCNTIP (procalcitonin:4,lacticidven:4)  ) Recent Results (from the past 240 hours)  Resp panel by RT-PCR (RSV, Flu A&B, Covid) Anterior Nasal Swab     Status: None   Collection Time: 12/29/23  3:37 PM   Specimen: Anterior Nasal Swab  Result Value Ref Range Status   SARS Coronavirus 2 by RT PCR NEGATIVE NEGATIVE Final   Influenza A by PCR NEGATIVE NEGATIVE Final   Influenza B by PCR NEGATIVE NEGATIVE Final    Comment: (NOTE) The Xpert Xpress SARS-CoV-2/FLU/RSV plus assay is intended as an aid in the diagnosis of influenza from Nasopharyngeal swab specimens and should not be used as a sole basis for treatment. Nasal washings and aspirates are unacceptable for Xpert Xpress SARS-CoV-2/FLU/RSV testing.  Fact Sheet for Patients: BloggerCourse.com  Fact Sheet for Healthcare  Providers: SeriousBroker.it  This test is not yet approved or cleared by the [place]  FDA and has been authorized for detection and/or diagnosis of SARS-CoV-2 by FDA under an Emergency Use Authorization (EUA). This EUA will remain in effect (meaning this test can be used) for the duration of the COVID-19 declaration under Section 564(b)(1) of the Act, 21 U.S.C. section 360bbb-3(b)(1), unless the authorization is terminated or revoked.     Resp Syncytial Virus by PCR NEGATIVE NEGATIVE Final    Comment: (NOTE) Fact Sheet for Patients: BloggerCourse.com  Fact Sheet for Healthcare Providers: SeriousBroker.it  This test is not yet approved or cleared by the [place]  FDA and has been authorized for detection and/or diagnosis of SARS-CoV-2 by FDA under an Emergency Use Authorization (EUA). This EUA will remain in effect (meaning this test can be used) for the duration of the COVID-19 declaration under Section 564(b)(1) of the  Act, 21 U.S.C. section 360bbb-3(b)(1), unless the authorization is terminated or revoked.  Performed at [facility], [address], [place], [place] [address]   MRSA Next Gen by PCR, Nasal     Status: None   Collection Time: 12/30/23  2:00 AM   Specimen: Nasal Mucosa; Nasal Swab  Result Value Ref Range Status   MRSA by PCR Next Gen NOT DETECTED NOT DETECTED Final    Comment: (NOTE) The GeneXpert MRSA Assay (FDA approved for NASAL specimens only), is one component of a comprehensive MRSA colonization surveillance program. It is not intended to diagnose MRSA infection nor to guide or monitor treatment for MRSA infections. Test performance is not FDA approved in patients less than 97 years old. Performed at [facility], [address] [place], [place] [address]      Radiology Studies: CT ABDOMEN PELVIS WO CONTRAST Result Date: 12/29/2023 CLINICAL DATA:   Acute abdominal pain.  Fatigue.  Swelling. EXAM: CT ABDOMEN AND PELVIS WITHOUT CONTRAST TECHNIQUE: Multidetector CT imaging of the abdomen and pelvis was performed following the standard protocol without IV contrast. RADIATION DOSE REDUCTION: This exam was performed according to the departmental dose-optimization program which includes automated exposure control, adjustment of the mA and/or kV according to patient size and/or use of iterative reconstruction technique. COMPARISON:  Abdominal ultrasound 08/18/2023 FINDINGS: Lower chest: Small left and trace right pleural effusion. The heart is enlarged. Circumferential left atrial calcifications. Mitral valve replacement, aortic valve replacement. Hepatobiliary: Hepatic cirrhosis with nodular contours. No obvious focal liver lesion on this unenhanced exam. Calcified gallstones within decompressed gallbladder. Pancreas: No ductal dilatation or inflammation. Spleen: No splenomegaly. The spleen is small in size measuring 7.8 cm in length. Adrenals/Urinary Tract: No adrenal nodule. Bilateral renal parenchymal thinning. No hydronephrosis. No renal calculi. Urinary bladder is only minimally distended. Stomach/Bowel: Bowel assessment is limited due to the presence of ascites. The stomach is nondistended. No small bowel obstruction or abnormal distention. The appendix is not confidently visualized. Small volume of formed stool in the colon. Short segment of transverse colon extends into a umbilical hernia. No associated wall thickening or obstruction. Sigmoid colonic diverticulosis without diverticulitis. Vascular/Lymphatic: Aortic atherosclerosis. No aortic aneurysm. Suspected retroperitoneal collaterals. Ascites limits assessment for adenopathy, no gross bulky enlarged nodes. Reproductive: Uterus and bilateral adnexa are unremarkable. Other: Large volume abdominopelvic ascites. Ascites extends into an umbilical hernia. Ascitic fluid appears simple. There is generalized body  wall edema. No free air. Musculoskeletal: Left hip arthroplasty. Lower lumbar facet hypertrophy. IMPRESSION: 1. Hepatic cirrhosis with large volume abdominopelvic ascites. 2. Cholelithiasis. 3. Short segment of transverse colon extends into a umbilical hernia. No associated wall thickening or obstruction. 4. Sigmoid colonic diverticulosis without diverticulitis. 5. Small left and trace right pleural effusions. Generalized body wall edema. Aortic Atherosclerosis (ICD10-I70.0). Electronically Signed   By: Andrea Gasman M.D.   On: 12/29/2023 17:24   CT Head Wo Contrast Result Date: 12/29/2023 EXAM: CT HEAD WITHOUT CONTRAST 12/29/2023 04:24:56 PM TECHNIQUE: CT of the head was performed without the administration of intravenous contrast. Automated exposure control, iterative reconstruction, and/or weight based adjustment of the mA/kV was utilized to reduce the radiation dose to as low as reasonably achievable. COMPARISON: None available. CLINICAL HISTORY: Mental status change, unknown cause. Table formatting from the original note was not included.; Pt BIB [facility] from home c/o confusion, fatigue, and swelling. FINDINGS: BRAIN AND VENTRICLES: No acute hemorrhage. No evidence of acute infarct. No hydrocephalus. No extra-axial collection. No mass effect or midline  shift. Remote right cerebellar infarct. Patchy white matter hyperintensities, compatible with chronic small vessel ischemic change. ORBITS: No acute abnormality. SINUSES: No acute abnormality. SOFT TISSUES AND SKULL: No acute soft tissue abnormality. No skull fracture. IMPRESSION: 1. No acute intracranial abnormality. 2. 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 Electronically signed by: Gilmore Molt MD 12/29/2023 05:11 PM EDT RP Workstation: [id number]   DG Chest Port 1 View Result Date: 12/29/2023 CLINICAL DATA:  Altered mental status. EXAM: PORTABLE CHEST 1 VIEW COMPARISON:  Aug 12, 2020. FINDINGS: Stable  cardiomegaly. Sternotomy wires are noted. Status post transcatheter aortic valve repair. No acute pulmonary disease is noted. Bony thorax is unremarkable. IMPRESSION: No acute abnormality seen. Electronically Signed   By: Lynwood Landy Raddle M.D.   On: 12/29/2023 16:58     Scheduled Meds:  doxycycline  100 mg Oral Q12H   lactulose  20 g Oral BID   levothyroxine  150 mcg Oral Q0600   metoprolol  succinate  25 mg Oral Daily   ondansetron (ZOFRAN) IV  4 mg Intravenous Once   rifaximin  550 mg Oral BID   sodium chloride flush  3 mL Intravenous Q12H   Continuous Infusions:  sodium chloride     heparin 950 Units/hr (12/30/23 0805)     LOS: 0 days    Time spent:    Sigurd Pac, MD [facility]   12/30/2023, 11:25 AM

## 2023-12-30 NOTE — ED Notes (Signed)
 Assisted pt onto bed pan for one urine occurences

## 2023-12-30 NOTE — Progress Notes (Signed)
 PHARMACY - ANTICOAGULATION CONSULT NOTE  Pharmacy Consult for heparin Indication: mechanical mitral valve and subtherapeutic INR on warfarin  No Known Allergies  Patient Measurements: Height: 5' 1 (154.9 cm) Weight: 63.5 kg (140 lb) IBW/kg (Calculated) : 47.8 HEPARIN DW (KG): 60.9  Vital Signs: Temp: 97.4 F (36.3 C) (10/10 0354) Temp Source: Oral (10/10 0354) BP: 112/61 (10/10 0700) Pulse Rate: 74 (10/10 0700)  Labs: Recent Labs    12/29/23 1537 12/29/23 1550 12/29/23 1828 12/29/23 2005 12/30/23 0345 12/30/23 0639  HGB 11.0* 12.9  --   --  10.2*  --   HCT 33.5* 38.0  --   --  30.8*  --   PLT 174  --   --   --  165  --   LABPROT 19.0*  --   --   --  20.2*  --   INR 1.5*  --   --   --  1.6*  --   HEPARINUNFRC  --   --   --   --   --  0.28*  CREATININE 1.54*  --   --  1.43*  --  1.38*  TROPONINIHS 12  --  12  --   --   --    Estimated Creatinine Clearance: 28.2 mL/min (A) (by C-G formula based on SCr of 1.38 mg/dL (H)).  Medical History: Past Medical History:  Diagnosis Date   Atrial fibrillation (HCC)    Heart disease    aortic and mitral valve problems   Hepatic cirrhosis (HCC)    Hypothyroidism     Medications:  Scheduled:   doxycycline  100 mg Oral Q12H   levothyroxine  150 mcg Oral Q0600   metoprolol  succinate  25 mg Oral Daily   ondansetron (ZOFRAN) IV  4 mg Intravenous Once   sodium chloride flush  3 mL Intravenous Q12H  Infusions:   sodium chloride     albumin human     heparin 850 Units/hr (12/30/23 0715)    Assessment: Patient is a 78 YO F presenting with abdominal distension, peripheral edema, and fatigue. 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 According to last anticoag clinic note on 9/25, warfarin regimen was one-half 5 mg tablet daily and INR was 3 at that time.   Initial INR subtherapeutic at 1.5; cardiology recommending bridge with heparin drip without bolus until the INR  reaches at least 2.0. Pharmacy consulted to dose heparin drip.   Heparin level subtherapeutic at 0.28. HgB 10.2 and PLTs 165. INR this AM remains subtherapeutic at 1.6, warfarin has not been resumed. No issues with heparin infusion or bleeding per RN.   Goal of Therapy:  Heparin level 0.3-0.7 units/ml Monitor platelets by anticoagulation protocol: Yes   Plan:  Increase heparin infusion to 950u/hr.  Recheck heparin level in 8 hours.  Monitor heparin levels, CBC, INR daily.  Will reach out to primary regarding when to resume warfarin.   Powell Blush, PharmD, BCCCP  [facility] 12/30/2023 7:43 AM

## 2023-12-30 NOTE — Progress Notes (Addendum)
 PHARMACY - ANTICOAGULATION CONSULT NOTE  Pharmacy Consult for heparin Indication: mechanical mitral valve and subtherapeutic INR on warfarin  No Known Allergies  Patient Measurements: Height: 5' 1 (154.9 cm) Weight: 63.5 kg (140 lb) IBW/kg (Calculated) : 47.8 HEPARIN DW (KG): 60.9  Vital Signs: Temp: 97.5 F (36.4 C) (10/10 1241) Temp Source: Axillary (10/10 1241) BP: 108/46 (10/10 1241) Pulse Rate: 84 (10/10 1241)  Labs: Recent Labs    12/29/23 1537 12/29/23 1550 12/29/23 1828 12/29/23 2005 12/30/23 0345 12/30/23 0639  HGB 11.0* 12.9  --   --  10.2*  --   HCT 33.5* 38.0  --   --  30.8*  --   PLT 174  --   --   --  165  --   LABPROT 19.0*  --   --   --  20.2*  --   INR 1.5*  --   --   --  1.6*  --   HEPARINUNFRC  --   --   --   --   --  0.28*  CREATININE 1.54*  --   --  1.43*  --  1.38*  TROPONINIHS 12  --  12  --   --   --    Estimated Creatinine Clearance: 28.2 mL/min (A) (by C-G formula based on SCr of 1.38 mg/dL (H)).  Medical History: Past Medical History:  Diagnosis Date   Atrial fibrillation (HCC)    Heart disease    aortic and mitral valve problems   Hepatic cirrhosis (HCC)    Hypothyroidism     Medications:  Scheduled:   furosemide  80 mg Intravenous BID   lactulose  20 g Oral BID   [START ON 12/31/2023] levothyroxine  50 mcg Intravenous Daily   metoprolol  succinate  25 mg Oral Daily   ondansetron (ZOFRAN) IV  4 mg Intravenous Once   rifaximin  550 mg Oral BID   sodium chloride flush  3 mL Intravenous Q12H   spironolactone   25 mg Oral Daily   warfarin  5 mg Oral ONCE-1600   Warfarin - Pharmacist Dosing Inpatient   Does not apply q1600  Infusions:   sodium chloride     albumin human     heparin 950 Units/hr (12/30/23 0805)    Assessment: Patient is a 78 YO F presenting with abdominal distension, peripheral edema, and fatigue. 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 According to  last anticoag clinic note on 9/25, warfarin regimen was one-half 5 mg tablet daily and INR was 3 at that time.   Initial INR subtherapeutic at 1.5; cardiology recommending bridge with heparin drip without bolus until the INR reaches at least 2.0. Pharmacy consulted to dose heparin drip.   Heparin level subtherapeutic at 0.28. HgB 10.2 and PLTs 165. INR this AM remains subtherapeutic at 1.6, warfarin has not been resumed. No issues with heparin infusion or bleeding per RN.   Dr. Fairy ordered to resume Warfarin today per pharmacy to dose/consult. INR goal 2.5-3.5  Goal of Therapy:  Heparin level 0.3-0.7 units/ml INR 2.5-3.5 Monitor platelets by anticoagulation protocol: Yes   Plan:   Heparin infusion increased earlier today to 950u/hr.   Will recheck heparin level in 8 hours after rate increased, HL due 1600.  Monitor heparin levels, CBC, INR daily.  Dr. Fairy said to resume Warfarin.  Give Warfarin 5mg  x1 today   Thank you for allowing pharmacy to be part of this patients care team.  x  Levorn Gaskins, RPh Clinical Pharmacist 12/30/2023 1:35 PM   ADDENDUM:   Hold Warfarin.  Cardiology request to hold warfarin as may need R/L cardiac cath on Monday 10/13.  Will discontinue Warfarin pharmacy consult.  Please resume pharmacy consult when ready to resume Warfarin.   Heparin infusion continues as noted above.  Thank you for allowing pharmacy to be part of this patients care team. Levorn Gaskins, RPh Clinical Pharmacist 12/30/2023 3:49 PM

## 2023-12-31 DIAGNOSIS — I824Y9 Acute embolism and thrombosis of unspecified deep veins of unspecified proximal lower extremity: Secondary | ICD-10-CM | POA: Diagnosis not present

## 2023-12-31 DIAGNOSIS — D649 Anemia, unspecified: Secondary | ICD-10-CM | POA: Diagnosis not present

## 2023-12-31 DIAGNOSIS — R188 Other ascites: Secondary | ICD-10-CM | POA: Diagnosis not present

## 2023-12-31 DIAGNOSIS — K7290 Hepatic failure, unspecified without coma: Secondary | ICD-10-CM

## 2023-12-31 DIAGNOSIS — K7460 Unspecified cirrhosis of liver: Secondary | ICD-10-CM | POA: Diagnosis not present

## 2023-12-31 DIAGNOSIS — E871 Hypo-osmolality and hyponatremia: Secondary | ICD-10-CM | POA: Diagnosis not present

## 2023-12-31 DIAGNOSIS — I5033 Acute on chronic diastolic (congestive) heart failure: Secondary | ICD-10-CM | POA: Diagnosis not present

## 2023-12-31 LAB — CBC
HCT: 31.5 % — ABNORMAL LOW (ref 36.0–46.0)
Hemoglobin: 10.4 g/dL — ABNORMAL LOW (ref 12.0–15.0)
MCH: 26.7 pg (ref 26.0–34.0)
MCHC: 33 g/dL (ref 30.0–36.0)
MCV: 81 fL (ref 80.0–100.0)
Platelets: 165 K/uL (ref 150–400)
RBC: 3.89 MIL/uL (ref 3.87–5.11)
RDW: 18.5 % — ABNORMAL HIGH (ref 11.5–15.5)
WBC: 5.5 K/uL (ref 4.0–10.5)
nRBC: 0 % (ref 0.0–0.2)

## 2023-12-31 LAB — COMPREHENSIVE METABOLIC PANEL WITH GFR
ALT: 8 U/L (ref 0–44)
AST: 25 U/L (ref 15–41)
Albumin: 3.2 g/dL — ABNORMAL LOW (ref 3.5–5.0)
Alkaline Phosphatase: 52 U/L (ref 38–126)
Anion gap: 13 (ref 5–15)
BUN: 30 mg/dL — ABNORMAL HIGH (ref 8–23)
CO2: 21 mmol/L — ABNORMAL LOW (ref 22–32)
Calcium: 8.9 mg/dL (ref 8.9–10.3)
Chloride: 93 mmol/L — ABNORMAL LOW (ref 98–111)
Creatinine, Ser: 1.47 mg/dL — ABNORMAL HIGH (ref 0.44–1.00)
GFR, Estimated: 36 mL/min — ABNORMAL LOW (ref >60)
Glucose, Bld: 96 mg/dL (ref 70–99)
Potassium: 5 mmol/L (ref 3.5–5.1)
Sodium: 127 mmol/L — ABNORMAL LOW (ref 135–145)
Total Bilirubin: 2.6 mg/dL — ABNORMAL HIGH (ref 0.0–1.2)
Total Protein: 7.2 g/dL (ref 6.5–8.1)

## 2023-12-31 LAB — PROTIME-INR
INR: 1.3 — ABNORMAL HIGH (ref 0.8–1.2)
Prothrombin Time: 17 s — ABNORMAL HIGH (ref 11.4–15.2)

## 2023-12-31 LAB — HEPARIN LEVEL (UNFRACTIONATED)
Heparin Unfractionated: >1.1 [IU]/mL — ABNORMAL HIGH (ref 0.30–0.70)
Heparin Unfractionated: <0.1 [IU]/mL — ABNORMAL LOW (ref 0.30–0.70)

## 2023-12-31 MED ORDER — FLUTICASONE PROPIONATE 50 MCG/ACT NA SUSP
1.0000 | Freq: Every day | NASAL | Status: DC
  Administered 2024-01-01 – 2024-01-16 (×15): 1 via NASAL
  Filled 2023-12-31 (×2): qty 16

## 2023-12-31 MED ORDER — HEPARIN (PORCINE) 25000 UT/250ML-% IV SOLN
950.0000 [IU]/h | INTRAVENOUS | Status: DC
  Administered 2024-01-01: 950 [IU]/h via INTRAVENOUS
  Filled 2023-12-31: qty 250

## 2023-12-31 NOTE — Progress Notes (Signed)
 Physical Therapy Treatment Patient Details Name: Chelsea Boyer MRN: [id number] DOB: [birth year] Today's Date: 12/31/2023   History of Present Illness Pt is a 78 y.o female presenting to the ED 10/9 for hyponatremia and volume overload related to cirrhosis. EFY:rpmmyndpd, CHF, CKD, hypothyroidism, s/p TAVR    PT Comments  Pt tolerated treatment well today. Pt today was able to ambulate short distance in hallway with RW CGA. Still limited by R hip pain however pt reports that it is improving since yesterday. No change in DC/DME recs at this time. PT will continue to follow.     If plan is discharge home, recommend the following: A little help with walking and/or transfers;A little help with bathing/dressing/bathroom;Assistance with cooking/housework;Assist for transportation;Help with stairs or ramp for entrance   Can travel by private vehicle        Equipment Recommendations  Rolling walker (2 wheels);BSC/3in1    Recommendations for Other Services       Precautions / Restrictions Precautions Precautions: Fall Recall of Precautions/Restrictions: Intact Precaution/Restrictions Comments: watch BP Restrictions Weight Bearing Restrictions Per Provider Order: No     Mobility  Bed Mobility Overal bed mobility: Needs Assistance Bed Mobility: Supine to Sit     Supine to sit: Mod assist, HOB elevated     General bed mobility comments: Assist to bring R leg off bed.    Transfers Overall transfer level: Needs assistance Equipment used: Rolling walker (2 wheels) Transfers: Sit to/from Stand, Bed to chair/wheelchair/BSC Sit to Stand: Contact guard assist   Step pivot transfers: Contact guard assist       General transfer comment: Cues for hand placement. CGA to transfer to [facility].    Ambulation/Gait Ambulation/Gait assistance: Contact guard assist Gait Distance (Feet): 20 Feet Assistive device: Rolling walker (2 wheels) Gait Pattern/deviations: Step-to pattern, Decreased  stance time - right, Decreased step length - left, Decreased weight shift to right, Antalgic, Trunk flexed, Narrow base of support Gait velocity: reduced     General Gait Details: Pt takes slow, small, antalgic steps, ambulating with a step-to pattern due to decreased R weight shift and stance time due to R hip pain thus decreased L step length. MinA intermittently needed for balance and RW management, cuing pt to sequence feet and RW especially when turning. Cues provided to offload R leg through pushing down on RW during R stance phase.   Stairs             Wheelchair Mobility     Tilt Bed    Modified Rankin (Stroke Patients Only)       Balance Overall balance assessment: Needs assistance Sitting-balance support: No upper extremity supported, Feet supported Sitting balance-Leahy Scale: Good     Standing balance support: Bilateral upper extremity supported, During functional activity, Reliant on assistive device for balance Standing balance-Leahy Scale: Poor Standing balance comment: reliant on RW                            Communication Communication Communication: No apparent difficulties  Cognition Arousal: Alert Behavior During Therapy: WFL for tasks assessed/performed   PT - Cognitive impairments: No family/caregiver present to determine baseline                       PT - Cognition Comments: A&Ox4, pt aware her husband believed her to have AMS but feels she is back to her baseline. Follows cues well, but slow to problem solve, unsure  if baseline or not. Following commands: Intact      Cueing Cueing Techniques: Verbal cues  Exercises      General Comments General comments (skin integrity, edema, etc.): VSS      Pertinent Vitals/Pain Pain Assessment Pain Assessment: 0-10 Pain Score: 7  Pain Location: back, R hip Pain Descriptors / Indicators: Discomfort, Sore, Grimacing, Guarding Pain Intervention(s): Monitored during session,  Limited activity within patient's tolerance, Repositioned    Home Living                          Prior Function            PT Goals (current goals can now be found in the care plan section) Progress towards PT goals: Progressing toward goals    Frequency    Min 2X/week      PT Plan      Co-evaluation              AM-PAC PT 6 Clicks Mobility   Outcome Measure  Help needed turning from your back to your side while in a flat bed without using bedrails?: A Lot Help needed moving from lying on your back to sitting on the side of a flat bed without using bedrails?: A Lot Help needed moving to and from a bed to a chair (including a wheelchair)?: A Little Help needed standing up from a chair using your arms (e.g., wheelchair or bedside chair)?: A Little Help needed to walk in hospital room?: A Little Help needed climbing 3-5 steps with a railing? : Total 6 Click Score: 14    End of Session Equipment Utilized During Treatment: Gait belt Activity Tolerance: Patient tolerated treatment well Patient left: in bed;with call bell/phone within reach Nurse Communication: Mobility status PT Visit Diagnosis: Unsteadiness on feet (R26.81);Other abnormalities of gait and mobility (R26.89);Muscle weakness (generalized) (M62.81);Difficulty in walking, not elsewhere classified (R26.2)     Time: 8893-8876 PT Time Calculation (min) (ACUTE ONLY): 17 min  Charges:    $Gait Training: 8-22 mins PT General Charges $$ ACUTE PT VISIT: 1 Visit                     Chelsea Boyer, PT, DPT Acute Rehab Services [phone]    Chelsea Boyer 12/31/2023, 3:05 PM

## 2023-12-31 NOTE — Plan of Care (Signed)
  Problem: Education: Goal: Knowledge of General Education information will improve Description: Including pain rating scale, medication(s)/side effects and non-pharmacologic comfort measures Outcome: Progressing   Problem: Health Behavior/Discharge Planning: Goal: Ability to manage health-related needs will improve Outcome: Progressing   Problem: Activity: Goal: Risk for activity intolerance will decrease Outcome: Progressing   Problem: Coping: Goal: Level of anxiety will decrease Outcome: Progressing   Problem: Elimination: Goal: Will not experience complications related to bowel motility Outcome: Progressing   

## 2023-12-31 NOTE — Progress Notes (Signed)
 PHARMACY - ANTICOAGULATION CONSULT NOTE  Pharmacy Consult for Heparin Indication: mechanical mitral valve and subtherapeutic INR on warfarin  No Known Allergies  Patient Measurements: Height: 5' 1 (154.9 cm) Weight: 62.9 kg (138 lb 11.2 oz) IBW/kg (Calculated) : 47.8 HEPARIN DW (KG): 60.9  Vital Signs: Temp: 98 F (36.7 C) (10/11 1249) Temp Source: Oral (10/11 1249) BP: 102/42 (10/11 1249) Pulse Rate: 88 (10/11 1249)  Labs: Recent Labs    12/29/23 1537 12/29/23 1550 12/29/23 1828 12/29/23 2005 12/30/23 0345 12/30/23 0639 12/30/23 1553 12/30/23 2317 12/31/23 0525 12/31/23 1457  HGB 11.0* 12.9  --   --  10.2*  --   --   --  10.4*  --   HCT 33.5* 38.0  --   --  30.8*  --   --   --  31.5*  --   PLT 174  --   --   --  165  --   --   --  165  --   LABPROT 19.0*  --   --   --  20.2*  --   --   --  17.0*  --   INR 1.5*  --   --   --  1.6*  --   --   --  1.3*  --   HEPARINUNFRC  --   --   --   --   --  0.28*   < > 0.53 >1.10* <0.10*  CREATININE 1.54*  --   --  1.43*  --  1.38*  --   --  1.47*  --   TROPONINIHS 12  --  12  --   --   --   --   --   --   --    < > = values in this interval not displayed.   Estimated Creatinine Clearance: 26.4 mL/min (A) (by C-G formula based on SCr of 1.47 mg/dL (H)).  Medical History: Past Medical History:  Diagnosis Date   Atrial fibrillation (HCC)    Heart disease    aortic and mitral valve problems   Hepatic cirrhosis (HCC)    Hypothyroidism     Medications:  Scheduled:   fluticasone  1 spray Each Nare Daily   furosemide  80 mg Intravenous BID   lactulose  20 g Oral BID   levothyroxine  50 mcg Intravenous Daily   metoprolol  succinate  25 mg Oral Daily   midodrine  5 mg Oral TID WC   ondansetron (ZOFRAN) IV  4 mg Intravenous Once   rifaximin  550 mg Oral BID   sodium chloride flush  3 mL Intravenous Q12H   spironolactone   25 mg Oral Daily  Infusions:   heparin 750 Units/hr (12/31/23 0853)    Assessment: Patient is a 78  YO F presenting with abdominal distension, peripheral edema, and fatigue. 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 According to last anticoag clinic note on 9/25, warfarin regimen was one-half 5 mg tablet daily and INR was 3 at that time. Warfarin on hold for possible R/L cardiac cath on Monday 10/13.   Heparin level supratherapeutic at >1.10, previously confirmed therapeutic. Visited patient at bedside to confirm labs drawn from opposite arm of site of infusion. Hgb 10.4 and PLTs 165.   PM update: HL now undetectable - was drawn from opposite arm approximately 6hr after heparin drip resumed. No issues with the infusion or bleeding reported per RN.   Goal of  Therapy:  Heparin level 0.3-0.7 units/ml Monitor platelets by anticoagulation protocol: Yes   Plan:  Increase Heparin infusion to 850 units/hr.  Check heparin level in 8 hours Warfarin on hold Monitor heparin levels, CBC, INR daily.   Thank you for allowing pharmacy to be part of this patients care team.  Rocky Slade, PharmD, BCPS [facility]  12/31/2023 3:50 PM   Please check AMION for all [facility] Pharmacy phone numbers After 10:00 PM, call Main Pharmacy [phone]

## 2023-12-31 NOTE — Progress Notes (Signed)
 PHARMACY - ANTICOAGULATION CONSULT NOTE  Pharmacy Consult for Heparin Indication: mechanical mitral valve and subtherapeutic INR on warfarin  No Known Allergies  Patient Measurements: Height: 5' 1 (154.9 cm) Weight: 63.5 kg (140 lb) IBW/kg (Calculated) : 47.8 HEPARIN DW (KG): 60.9  Vital Signs: Temp: 97.6 F (36.4 C) (10/11 0400) Temp Source: Oral (10/11 0400) BP: 111/56 (10/11 0400) Pulse Rate: 98 (10/11 0400)  Labs: Recent Labs    12/29/23 1537 12/29/23 1550 12/29/23 1828 12/29/23 2005 12/30/23 0345 12/30/23 0639 12/30/23 0639 12/30/23 1553 12/30/23 2317 12/31/23 0525  HGB 11.0* 12.9  --   --  10.2*  --   --   --   --  10.4*  HCT 33.5* 38.0  --   --  30.8*  --   --   --   --  31.5*  PLT 174  --   --   --  165  --   --   --   --  165  LABPROT 19.0*  --   --   --  20.2*  --   --   --   --  17.0*  INR 1.5*  --   --   --  1.6*  --   --   --   --  1.3*  HEPARINUNFRC  --   --   --   --   --  0.28*   < > 0.36 0.53 >1.10*  CREATININE 1.54*  --   --  1.43*  --  1.38*  --   --   --  1.47*  TROPONINIHS 12  --  12  --   --   --   --   --   --   --    < > = values in this interval not displayed.   Estimated Creatinine Clearance: 26.5 mL/min (A) (by C-G formula based on SCr of 1.47 mg/dL (H)).  Medical History: Past Medical History:  Diagnosis Date   Atrial fibrillation (HCC)    Heart disease    aortic and mitral valve problems   Hepatic cirrhosis (HCC)    Hypothyroidism     Medications:  Scheduled:   furosemide  80 mg Intravenous BID   lactulose  20 g Oral BID   levothyroxine  50 mcg Intravenous Daily   metoprolol  succinate  25 mg Oral Daily   midodrine  5 mg Oral TID WC   ondansetron (ZOFRAN) IV  4 mg Intravenous Once   rifaximin  550 mg Oral BID   sodium chloride flush  3 mL Intravenous Q12H   spironolactone   25 mg Oral Daily  Infusions:   heparin 950 Units/hr (12/31/23 0051)    Assessment: Patient is a 78 YO F presenting with abdominal distension,  peripheral edema, and fatigue. 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 According to last anticoag clinic note on 9/25, warfarin regimen was one-half 5 mg tablet daily and INR was 3 at that time. Warfarin on hold for possible R/L cardiac cath on Monday 10/13.   Heparin level supratherapeutic at >1.10, previously confirmed therapeutic. Visited patient at bedside to confirm labs drawn from opposite arm of site of infusion. Hgb 10.4 and PLTs 165.    Goal of Therapy:  Heparin level 0.3-0.7 units/ml Monitor platelets by anticoagulation protocol: Yes   Plan:  Hold heparin for 1 hour Decrease Heparin infusion to 750 units/hr.  Check confirmatory heparin level in 8 hours Warfarin on hold Monitor  heparin levels, CBC, INR daily.   Thank you for allowing pharmacy to be part of this patients care team.  Dionicia Canavan, PharmD, RPh PGY1 Acute Care Pharmacy Resident [facility]  12/31/2023 7:16 AM   Please check AMION for all [facility] Pharmacy phone numbers After 10:00 PM, call Main Pharmacy [phone]

## 2023-12-31 NOTE — Progress Notes (Signed)
 PROGRESS NOTE        PATIENT DETAILS Name: Chelsea Boyer Age: 78 y.o. Sex: female Date of Birth: 1945-06-14 Admit Date: 12/29/2023 Admitting Physician Sigurd Pac, MD ERE:[email or web address], Lamarr RAMAN, MD  Brief Summary: Patient is a 78 y.o.  female with history of Hollie liver cirrhosis, chronic HFpEF/RV failure, A-fib on Coumadin , mechanical mitral valve replacement, TAVR-who presented with anasarca-thought to be secondary to combination of decompensated liver cirrhosis and RV failure.  Significant events: 10/9>> admit to [facility]  Significant studies: 10/9>> CT head: No acute intracranial abnormality. 10/9>> CT abdomen/pelvis: Cirrhosis-large volume ascites. 10/9>> CXR: No acute abnormality. 10/10>> echo: EF 55-60%, RV systolic function moderately reduced, RVSP 76.8.  Moderate to severe aortic valve stenosis.  Significant microbiology data: 10/9>> COVID/influenza/RSV PCR: Negative 10/9>> blood culture: No growth  Procedures: 10/10>> paracentesis (WBC 105,8% neutrophils).  Consults: GI Cardiology  Subjective: Feels slightly better after large-volume paracentesis yesterday.  No other complaints.  Objective: Vitals: Blood pressure (!) 111/56, pulse 98, temperature 97.6 F (36.4 C), temperature source Oral, resp. rate (!) 22, height 5' 1 (1.549 m), weight 63.5 kg, SpO2 98%.   Exam: Gen Exam:Alert awake-not in any distress HEENT:atraumatic, normocephalic Chest: B/L clear to auscultation anteriorly CVS:S1S2 regular Abdomen: Soft-distended but not tense.  Dull in the flanks bilaterally. Extremities:+++ edema Neurology: Non focal Skin: no rash  Pertinent Labs/Radiology:    Latest Ref Rng & Units 12/31/2023    5:25 AM 12/30/2023    3:45 AM 12/29/2023    3:50 PM  CBC  WBC 4.0 - 10.5 K/uL 5.5  4.5    Hemoglobin 12.0 - 15.0 g/dL 89.5  89.7  87.0   Hematocrit 36.0 - 46.0 % 31.5  30.8  38.0   Platelets 150 - 400 K/uL 165  165      Lab Results   Component Value Date   NA 127 (L) 12/31/2023   K 5.0 12/31/2023   CL 93 (L) 12/31/2023   CO2 21 (L) 12/31/2023      Assessment/Plan: Anasarca/ascites Secondary to decompensated liver cirrhosis and HFpEF/RV failure exacerbation. Continue diuretics-Lasix 80 mg twice daily/Aldactone  25 daily Daily weights/intake/output Monitor electrolytes  Acute on chronic HFpEF RV failure Pulmonary hypertension Contributing to massive anasarca BP soft overnight-now on midodrine for BP support. Continue diuretics-watch electrolytes/intake/output/weights. 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 Probably exacerbating RV failure Cardiology contemplating LHC/RHC-for hemodynamic readings/pressure gradients Difficult situation-cannot imagine she would be a candidate for redo TAVR. May need involvement by palliative care at some point in time.  Hyponatremia Secondary to hypervolemia-from CHF/liver cirrhosis Continue diuresis Fluid restrict Follow electrolytes.  Decompensated NASH liver cirrhosis Continue diuretics S/p paracentesis 10/10-no evidence of SBP Awake/alert-no evidence of encephalopathy-continue lactulose/rifaximin.  Chronic A-fib Rate controlled-metoprolol  Coumadin  on hold-on IV heparin in anticipation of possible RHC/LHC next week.  History of mechanical mitral valve replacement Coumadin  on hold-IV heparin Thankfully mechanical mitral valve appears stable on echo.  Hypothyroidism TSH elevated-19.5 on 10/9-felt to be due to poor absorption of Synthroid through edematous cut Currently on IV Synthroid-switch to oral route once volume status improves.  Normocytic anemia Likely secondary to acute illness superimposed on underlying chronic disease Monitor Hb  Debility/deconditioning PT/OT eval  Palliative care/goals of care Full code for now Briefly discussed with patient regarding  complexity of her underlying issues-risk  of recurrence-no good long-term solutions.  For now continue diuresis-await further cardiology input-await potential RHC/LHC next week-but suspect will need palliative care evaluation at some point.  Code status:   Code Status: Full Code   DVT Prophylaxis: SCDs Start: 12/30/23 0511 IV Heparin   Family Communication: None at bedside   Disposition Plan: Status is: Inpatient Remains inpatient appropriate because: Severity of illness   Planned Discharge Destination:Home health   Diet: Diet Order             Diet 2 gram sodium Room service appropriate? Yes; Fluid consistency: Thin  Diet effective now                     Antimicrobial agents: Anti-infectives (From admission, onward)    Start     Dose/Rate Route Frequency Ordered Stop   12/30/23 1030  rifaximin (XIFAXAN) tablet 550 mg        550 mg Oral 2 times daily 12/30/23 1029     12/30/23 0200  doxycycline (VIBRA-TABS) tablet 100 mg  Status:  Discontinued        100 mg Oral Every 12 hours 12/29/23 2347 12/30/23 1244        MEDICATIONS: Scheduled Meds:  furosemide  80 mg Intravenous BID   lactulose  20 g Oral BID   levothyroxine  50 mcg Intravenous Daily   metoprolol  succinate  25 mg Oral Daily   midodrine  5 mg Oral TID WC   ondansetron (ZOFRAN) IV  4 mg Intravenous Once   rifaximin  550 mg Oral BID   sodium chloride flush  3 mL Intravenous Q12H   spironolactone   25 mg Oral Daily   Continuous Infusions:  heparin 750 Units/hr (12/31/23 0853)   PRN Meds:.HYDROcodone-acetaminophen, ondansetron **OR** ondansetron (ZOFRAN) IV, sodium chloride flush   I have personally reviewed following labs and imaging studies  LABORATORY DATA: CBC: Recent Labs  Lab 12/29/23 1537 12/29/23 1550 12/30/23 0345 12/31/23 0525  WBC 4.3  --  4.5 5.5  NEUTROABS 3.0  --   --   --   HGB 11.0* 12.9 10.2* 10.4*  HCT 33.5* 38.0 30.8* 31.5*  MCV 81.5  --  81.1 81.0  PLT 174  --  165  165    Basic Metabolic Panel: Recent Labs  Lab 12/29/23 1537 12/29/23 1550 12/29/23 2005 12/30/23 0639 12/31/23 0525  NA 124* 127* 125* 125* 127*  K 5.5* 5.5* 5.2* 4.7 5.0  CL 93*  --  94* 93* 93*  CO2 21*  --  19* 20* 21*  GLUCOSE 83  --  75 65* 96  BUN 32*  --  31* 28* 30*  CREATININE 1.54*  --  1.43* 1.38* 1.47*  CALCIUM 9.2  --  8.8* 8.5* 8.9  MG 2.0  --   --  1.8  --   PHOS  --   --   --  3.4  --     GFR: Estimated Creatinine Clearance: 26.5 mL/min (A) (by C-G formula based on SCr of 1.47 mg/dL (H)).  Liver Function Tests: Recent Labs  Lab 12/29/23 1537 12/30/23 0639 12/31/23 0525  AST 35 29 25  ALT 10 9 8   ALKPHOS 74 60 52  BILITOT 2.4* 2.1* 2.6*  PROT 8.7* 7.4 7.2  ALBUMIN 3.0* 2.6* 3.2*   No results for input(s): LIPASE, AMYLASE in the last 168 hours. Recent Labs  Lab 12/29/23 1537  AMMONIA 15    Coagulation Profile: Recent Labs  Lab 12/29/23 1537 12/30/23  0345 12/31/23 0525  INR 1.5* 1.6* 1.3*    Cardiac Enzymes: No results for input(s): CKTOTAL, CKMB, CKMBINDEX, TROPONINI in the last 168 hours.  BNP (last 3 results) No results for input(s): PROBNP in the last 8760 hours.  Lipid Profile: No results for input(s): CHOL, HDL, LDLCALC, TRIG, CHOLHDL, LDLDIRECT in the last 72 hours.  Thyroid Function Tests: Recent Labs    12/29/23 2005  TSH 19.514*  FREET4 1.26*    Anemia Panel: Recent Labs    12/29/23 2005 12/30/23 0345  VITAMINB12  --  1,469*  FOLATE  --  10.1  FERRITIN 251  --   TIBC 372  --   IRON 39  --   RETICCTPCT 1.4  --     Urine analysis:    Component Value Date/Time   COLORURINE YELLOW 12/29/2023 1900   APPEARANCEUR CLEAR 12/29/2023 1900   LABSPEC 1.009 12/29/2023 1900   PHURINE 5.0 12/29/2023 1900   GLUCOSEU NEGATIVE 12/29/2023 1900   HGBUR NEGATIVE 12/29/2023 1900   BILIRUBINUR NEGATIVE 12/29/2023 1900   KETONESUR NEGATIVE 12/29/2023 1900   PROTEINUR NEGATIVE 12/29/2023 1900    NITRITE NEGATIVE 12/29/2023 1900   LEUKOCYTESUR NEGATIVE 12/29/2023 1900    Sepsis Labs: Lactic Acid, Venous No results found for: LATICACIDVEN  MICROBIOLOGY: Recent Results (from the past 240 hours)  Resp panel by RT-PCR (RSV, Flu A&B, Covid) Anterior Nasal Swab     Status: None   Collection Time: 12/29/23  3:37 PM   Specimen: Anterior Nasal Swab  Result Value Ref Range Status   SARS Coronavirus 2 by RT PCR NEGATIVE NEGATIVE Final   Influenza A by PCR NEGATIVE NEGATIVE Final   Influenza B by PCR NEGATIVE NEGATIVE Final    Comment: (NOTE) The Xpert Xpress SARS-CoV-2/FLU/RSV plus assay is intended as an aid in the diagnosis of influenza from Nasopharyngeal swab specimens and should not be used as a sole basis for treatment. Nasal washings and aspirates are unacceptable for Xpert Xpress SARS-CoV-2/FLU/RSV testing.  Fact Sheet for Patients: BloggerCourse.com  Fact Sheet for Healthcare Providers: SeriousBroker.it  This test is not yet approved or cleared by the [place]  FDA and has been authorized for detection and/or diagnosis of SARS-CoV-2 by FDA under an Emergency Use Authorization (EUA). This EUA will remain in effect (meaning this test can be used) for the duration of the COVID-19 declaration under Section 564(b)(1) of the Act, 21 U.S.C. section 360bbb-3(b)(1), unless the authorization is terminated or revoked.     Resp Syncytial Virus by PCR NEGATIVE NEGATIVE Final    Comment: (NOTE) Fact Sheet for Patients: BloggerCourse.com  Fact Sheet for Healthcare Providers: SeriousBroker.it  This test is not yet approved or cleared by the [place]  FDA and has been authorized for detection and/or diagnosis of SARS-CoV-2 by FDA under an Emergency Use Authorization (EUA). This EUA will remain in effect (meaning this test can be used) for the duration of the COVID-19  declaration under Section 564(b)(1) of the Act, 21 U.S.C. section 360bbb-3(b)(1), unless the authorization is terminated or revoked.  Performed at [facility], [address] [place], [place] [address]   Urine Culture     Status: None   Collection Time: 12/29/23 10:16 PM   Specimen: Urine, Clean Catch  Result Value Ref Range Status   Specimen Description URINE, CLEAN CATCH  Final   Special Requests NONE  Final   Culture   Final    NO GROWTH Performed at [facility], [address], [place],  [place] [address]    Report Status 12/30/2023 FINAL  Final  MRSA Next Gen by PCR, Nasal     Status: None   Collection Time: 12/30/23  2:00 AM   Specimen: Nasal Mucosa; Nasal Swab  Result Value Ref Range Status   MRSA by PCR Next Gen NOT DETECTED NOT DETECTED Final    Comment: (NOTE) The GeneXpert MRSA Assay (FDA approved for NASAL specimens only), is one component of a comprehensive MRSA colonization surveillance program. It is not intended to diagnose MRSA infection nor to guide or monitor treatment for MRSA infections. Test performance is not FDA approved in patients less than 10 years old. Performed at [facility], [address] [place], [place] [address]   Gram stain     Status: None   Collection Time: 12/30/23 11:42 AM   Specimen: Abdomen; Peritoneal Fluid  Result Value Ref Range Status   Specimen Description ABDOMEN  Final   Special Requests PERITONEAL  Final   Gram Stain   Final    NO WBC SEEN NO ORGANISMS SEEN Performed at [facility], [address] [place], [place] [address]    Report Status 12/30/2023 FINAL  Final    RADIOLOGY STUDIES/RESULTS: ECHOCARDIOGRAM COMPLETE Result Date: 12/30/2023    ECHOCARDIOGRAM REPORT   Patient Name:   ADRIANNA DUDAS Cockerell Date of Exam: 12/30/2023 Medical Rec #:  [id number]      Height:       61.0 in Accession #:    [id number]     Weight:       140.0 lb Date of Birth:  Jul 10, 1945      BSA:          1.623 m Patient  Age:    72 years       BP:           112/61 mmHg Patient Gender: F              HR:           82 bpm. Exam Location:  Inpatient Procedure: 2D Echo (Both Spectral and Color Flow Doppler were utilized during            procedure). Indications:    CHF  History:        Patient has prior history of Echocardiogram examinations. CHF;                 Aortic Valve Disease and Mitral Valve Disease.                 Aortic Valve: 23 mm Sapien prosthetic, stented (TAVR) valve is                 present in the aortic position.                 Mitral Valve: 25 mm St. Jude mechanical valve valve is present                 in the mitral position.  Sonographer:    Norleen Amour Referring Phys: [id number] ANASTASSIA DOUTOVA IMPRESSIONS  1. Left ventricular ejection fraction, by estimation, is 55 to 60%. The left ventricle has normal function. The left ventricle has no regional wall motion abnormalities. There is mild left ventricular hypertrophy. Left ventricular diastolic function could not be evaluated.  2. Right ventricular systolic function is moderately reduced. The right ventricular size is severely enlarged. There is severely elevated pulmonary artery systolic pressure. The estimated right ventricular systolic  pressure is 76.8 mmHg.  3. Left atrial size was severely dilated.  4. Right atrial size was severely dilated.  5. The mitral valve has been repaired/replaced. No evidence of mitral valve regurgitation. The mean mitral valve gradient is 7.7 mmHg. There is a 25 mm St. Jude mechanical valve present in the mitral position.  6. Tricuspid valve regurgitation is severe.  7. The aortic valve has been repaired/replaced. There is moderate calcification of the aortic valve. There is moderate thickening of the aortic valve. Aortic valve regurgitation is mild. Moderate to severe aortic valve stenosis. There is a 23 mm Sapien prosthetic (TAVR) valve present in the aortic position.  8. The inferior vena cava is dilated in size with <50%  respiratory variability, suggesting right atrial pressure of 15 mmHg. Comparison(s): Changes from prior study are noted. Aortic valve gradient has ranged from 15-20 mmHg since at least 2019 based on reports, with increase to 23.5 mmHg last year and now 33 mmHg. Visually TAVR leaflets are calcified and restricted. Stroke volume is low, which likely underestimates gradients. Suspect based on AVA and DI that TAVR has moderate to severe stenosis. FINDINGS  Left Ventricle: Left ventricular ejection fraction, by estimation, is 55 to 60%. The left ventricle has normal function. The left ventricle has no regional wall motion abnormalities. The left ventricular internal cavity size was normal in size. There is  mild left ventricular hypertrophy. Abnormal (paradoxical) septal motion consistent with post-operative status. Left ventricular diastolic function could not be evaluated due to mitral valve replacement. Left ventricular diastolic function could not be evaluated. Right Ventricle: The right ventricular size is severely enlarged. Right vetricular wall thickness was not well visualized. Right ventricular systolic function is moderately reduced. There is severely elevated pulmonary artery systolic pressure. The tricuspid regurgitant velocity is 3.93 m/s, and with an assumed right atrial pressure of 15 mmHg, the estimated right ventricular systolic pressure is 76.8 mmHg. Left Atrium: Left atrial size was severely dilated. Right Atrium: Right atrial size was severely dilated. Pericardium: There is no evidence of pericardial effusion. Mitral Valve: The mitral valve has been repaired/replaced. No evidence of mitral valve regurgitation. There is a 25 mm St. Jude mechanical valve present in the mitral position. MV peak gradient, 17.6 mmHg. The mean mitral valve gradient is 7.7 mmHg. Tricuspid Valve: The tricuspid valve is normal in structure. Tricuspid valve regurgitation is severe. No evidence of tricuspid stenosis. Aortic  Valve: Thickened/calcified leaflets of TAVR valve with restricted motion. The aortic valve has been repaired/replaced. There is moderate calcification of the aortic valve. There is moderate thickening of the aortic valve. Aortic valve regurgitation is mild. Aortic regurgitation PHT measures 550 msec. Moderate to severe aortic stenosis is present. Aortic valve mean gradient measures 33.0 mmHg. Aortic valve peak gradient measures 45.9 mmHg. Aortic valve area, by VTI measures 0.72 cm. There is a 23 mm Sapien prosthetic, stented (TAVR) valve present in the aortic position. Pulmonic Valve: The pulmonic valve was grossly normal. Pulmonic valve regurgitation is mild. No evidence of pulmonic stenosis. Aorta: The aortic root, ascending aorta, aortic arch and descending aorta are all structurally normal, with no evidence of dilitation or obstruction. Venous: The inferior vena cava is dilated in size with less than 50% respiratory variability, suggesting right atrial pressure of 15 mmHg. IAS/Shunts: The atrial septum is grossly normal.  LEFT VENTRICLE PLAX 2D LVIDd:         4.50 cm LVIDs:         3.00 cm LV PW:  1.10 cm LV IVS:        1.20 cm LVOT diam:     2.10 cm LV SV:         58 LV SV Index:   36 LVOT Area:     3.46 cm  LV Volumes (MOD) LV vol d, MOD A2C: 166.0 ml LV vol d, MOD A4C: 101.0 ml LV vol s, MOD A2C: 62.3 ml LV vol s, MOD A4C: 46.4 ml LV SV MOD A2C:     103.7 ml LV SV MOD A4C:     101.0 ml LV SV MOD BP:      87.5 ml RIGHT VENTRICLE            IVC RV Basal diam:  5.00 cm    IVC diam: 2.20 cm RV S prime:     8.05 cm/s TAPSE (M-mode): 1.5 cm LEFT ATRIUM             Index        RIGHT ATRIUM           Index LA diam:        5.90 cm 3.64 cm/m   RA Area:     27.80 cm LA Vol (A2C):   91.0 ml 56.07 ml/m  RA Volume:   101.00 ml 62.23 ml/m LA Vol (A4C):   57.1 ml 35.18 ml/m LA Biplane Vol: 76.3 ml 47.01 ml/m  AORTIC VALVE                     PULMONIC VALVE AV Area (Vmax):    0.68 cm      PV Vmax:       1.40  m/s AV Area (Vmean):   0.68 cm      PV Peak grad:  7.8 mmHg AV Area (VTI):     0.72 cm AV Vmax:           338.80 cm/s AV Vmean:          235.600 cm/s AV VTI:            0.804 m AV Peak Grad:      45.9 mmHg AV Mean Grad:      33.0 mmHg LVOT Vmax:         66.37 cm/s LVOT Vmean:        46.500 cm/s LVOT VTI:          0.168 m LVOT/AV VTI ratio: 0.21 AI PHT:            550 msec  AORTA Ao Root diam: 2.70 cm Ao Asc diam:  2.80 cm MITRAL VALVE               TRICUSPID VALVE MV Area (PHT): 2.27 cm    TR Peak grad:   61.8 mmHg MV Area VTI:   1.85 cm    TR Vmax:        393.00 cm/s MV Peak grad:  17.6 mmHg MV Mean grad:  7.7 mmHg    SHUNTS MV Vmax:       2.10 m/s    Systemic VTI:  0.17 m MV Vmean:      122.4 cm/s  Systemic Diam: 2.10 cm Shelda Bruckner MD Electronically signed by Shelda Bruckner MD Signature Date/Time: 12/30/2023/1:41:41 PM    Final    IR Paracentesis Result Date: 12/30/2023 INDICATION: Hx of NASH/MASH cirrhosis with ascites. Consult for her first diagnostic and therapeutic paracentesis. EXAM: ULTRASOUND GUIDED DIAGNOSTIC AND THERAPEUTIC PARACENTESIS MEDICATIONS: 7 mL 1%  lidocaine COMPLICATIONS: None immediate. PROCEDURE: Informed written consent was obtained from the patient after a discussion of the risks, benefits and alternatives to treatment. A timeout was performed prior to the initiation of the procedure. Initial ultrasound scanning demonstrates a large amount of ascites within the right lower abdominal quadrant. The right lower abdomen was prepped and draped in the usual sterile fashion. 1% lidocaine was used for local anesthesia. Following this, a 19 gauge, 7-cm, Yueh catheter was introduced. An ultrasound image was saved for documentation purposes. The paracentesis was performed. The catheter was removed and a dressing was applied. The patient tolerated the procedure well without immediate post procedural complication. Patient received post-procedure intravenous albumin; see nursing  notes for details. FINDINGS: A total of approximately 10 liters of hazy dark yellow fluid was removed. Samples were sent to the laboratory as requested by the clinical team. IMPRESSION: Successful ultrasound-guided paracentesis yielding 10 liters of peritoneal fluid. Performed by: Kimble Clas, PA-C Electronically Signed   By: Cordella Banner   On: 12/30/2023 12:35   CT ABDOMEN PELVIS WO CONTRAST Result Date: 12/29/2023 CLINICAL DATA:  Acute abdominal pain.  Fatigue.  Swelling. EXAM: CT ABDOMEN AND PELVIS WITHOUT CONTRAST TECHNIQUE: Multidetector CT imaging of the abdomen and pelvis was performed following the standard protocol without IV contrast. RADIATION DOSE REDUCTION: This exam was performed according to the departmental dose-optimization program which includes automated exposure control, adjustment of the mA and/or kV according to patient size and/or use of iterative reconstruction technique. COMPARISON:  Abdominal ultrasound 08/18/2023 FINDINGS: Lower chest: Small left and trace right pleural effusion. The heart is enlarged. Circumferential left atrial calcifications. Mitral valve replacement, aortic valve replacement. Hepatobiliary: Hepatic cirrhosis with nodular contours. No obvious focal liver lesion on this unenhanced exam. Calcified gallstones within decompressed gallbladder. Pancreas: No ductal dilatation or inflammation. Spleen: No splenomegaly. The spleen is small in size measuring 7.8 cm in length. Adrenals/Urinary Tract: No adrenal nodule. Bilateral renal parenchymal thinning. No hydronephrosis. No renal calculi. Urinary bladder is only minimally distended. Stomach/Bowel: Bowel assessment is limited due to the presence of ascites. The stomach is nondistended. No small bowel obstruction or abnormal distention. The appendix is not confidently visualized. Small volume of formed stool in the colon. Short segment of transverse colon extends into a umbilical hernia. No associated wall thickening or  obstruction. Sigmoid colonic diverticulosis without diverticulitis. Vascular/Lymphatic: Aortic atherosclerosis. No aortic aneurysm. Suspected retroperitoneal collaterals. Ascites limits assessment for adenopathy, no gross bulky enlarged nodes. Reproductive: Uterus and bilateral adnexa are unremarkable. Other: Large volume abdominopelvic ascites. Ascites extends into an umbilical hernia. Ascitic fluid appears simple. There is generalized body wall edema. No free air. Musculoskeletal: Left hip arthroplasty. Lower lumbar facet hypertrophy. IMPRESSION: 1. Hepatic cirrhosis with large volume abdominopelvic ascites. 2. Cholelithiasis. 3. Short segment of transverse colon extends into a umbilical hernia. No associated wall thickening or obstruction. 4. Sigmoid colonic diverticulosis without diverticulitis. 5. Small left and trace right pleural effusions. Generalized body wall edema. Aortic Atherosclerosis (ICD10-I70.0). Electronically Signed   By: Andrea Gasman M.D.   On: 12/29/2023 17:24   CT Head Wo Contrast Result Date: 12/29/2023 EXAM: CT HEAD WITHOUT CONTRAST 12/29/2023 04:24:56 PM TECHNIQUE: CT of the head was performed without the administration of intravenous contrast. Automated exposure control, iterative reconstruction, and/or weight based adjustment of the mA/kV was utilized to reduce the radiation dose to as low as reasonably achievable. COMPARISON: None available. CLINICAL HISTORY: Mental status change, unknown cause. Table formatting from the original note was not included.; Pt BIB  [facility] from home c/o confusion, fatigue, and swelling. FINDINGS: BRAIN AND VENTRICLES: No acute hemorrhage. No evidence of acute infarct. No hydrocephalus. No extra-axial collection. No mass effect or midline shift. Remote right cerebellar infarct. Patchy white matter hyperintensities, compatible with chronic small vessel ischemic change. ORBITS: No acute abnormality. SINUSES: No acute abnormality. SOFT TISSUES AND SKULL: No  acute soft tissue abnormality. No skull fracture. IMPRESSION: 1. No acute intracranial abnormality. 2. 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 Electronically signed by: Gilmore Molt MD 12/29/2023 05:11 PM EDT RP Workstation: [id number]   DG Chest Port 1 View Result Date: 12/29/2023 CLINICAL DATA:  Altered mental status. EXAM: PORTABLE CHEST 1 VIEW COMPARISON:  Aug 12, 2020. FINDINGS: Stable cardiomegaly. Sternotomy wires are noted. Status post transcatheter aortic valve repair. No acute pulmonary disease is noted. Bony thorax is unremarkable. IMPRESSION: No acute abnormality seen. Electronically Signed   By: Lynwood Landy Raddle M.D.   On: 12/29/2023 16:58     LOS: 1 day   Donalda Applebaum, MD  [facility]    To contact the attending provider between 7A-7P or the covering provider during after hours 7P-7A, please log into the web site [email or web address] and access using universal [place] password for that web site. If you do not have the password, please call the hospital operator.  12/31/2023, 9:45 AM

## 2023-12-31 NOTE — Progress Notes (Signed)
 Initial Nutrition Assessment  DOCUMENTATION CODES:   Not applicable  INTERVENTION:  Recommend daily weights to monitor acute trends  Encourage adequate PO intake to help meet calorie and protein needs Continue 2g sodium w/ 1500ml fluid restriction Mighty Shake TID with meals, each supplement provides 330 kcals and 9 grams of protein Magic cup TID with meals, each supplement provides 290 kcal and 9 grams of protein  Monitor electrolyte labs  NUTRITION DIAGNOSIS:   Increased nutrient needs related to chronic illness (NASH cirrhosis, anasarca) as evidenced by estimated needs.  GOAL:   Patient will meet greater than or equal to 90% of their needs  MONITOR:   PO intake, Supplement acceptance, Weight trends, Labs  REASON FOR ASSESSMENT:   Consult Assessment of nutrition requirement/status  ASSESSMENT:   Pt with hx of NASH liver cirrhosis, chronic heart failure, atrial fibrillation, and TAVR. 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  10/9 - admitted 10/10 - underwent paracentesis, 10L hazy dark fluid from R abdomen removed  Not hungry, try to eat get full quickly no appetite; decreased app PTA  Cheese/crackers; lasagna, veg fruit; no ONS  Pt continues diuresis. Continued hyponatremia likely related to hypervolemia, will continue to monitor. Following paracentesis yesterday, total protein of ascitic fluid was 4.4, consistent with cardiogenic ascites.   Pt's admit weight appears to be copy forwarded and pt has not been weighed since paracentesis. Recommend daily weights to assess weight trends while being diuresed. Pt at risk for protein malnutrition given severe anasarca.   Spoke with pt who reports she feels slightly better today but still has a poor appetite. She states she just does not feel like eating but tries to force herself but nothing sounds appetizing. Then when she tries to eat, she can only eat a few bites before stopping. Encouraged adequate PO  intake to help meet her needs. Pt agreeable to try some ONS to promote increased intake, will try Mighty Shake due to fluid restriction and magic cups. Pt agreeable to interventions.  PTA, pt reports her husband had noticed her decreased appetite within the last month. She reports she would eat small amounts of food throughout the day. 24 hr recall consisted of small amount of cheese + crackers, fruit, then a small portion of dinner (lasagna OR meat + vegetable). Suspect malnutrition in the context of chronic illness (NASH cirrhosis, anasarca) but unable to diagnose at this time.  Medications: Lasix BID Lactulose Levothyroxine Midodrine TID Rifaximin   Labs:  Sodium 127/ Cl 93 (low) BUN 30/Cr 1.47 (high) Total bilirubin 2.6 (high)   NUTRITION - FOCUSED PHYSICAL EXAM:  Deferred to follow up  Diet Order:   Diet Order             Diet 2 gram sodium Room service appropriate? Yes; Fluid consistency: Thin; Fluid restriction: 1500 mL Fluid  Diet effective now                   EDUCATION NEEDS:   No education needs have been identified at this time  Skin:  Skin Assessment: Reviewed RN Assessment  Last BM:  10/9  Height:   Ht Readings from Last 1 Encounters:  12/29/23 5' 1 (1.549 m)    Weight:   Wt Readings from Last 1 Encounters:  12/29/23 63.5 kg    Ideal Body Weight:  47.7 kg  BMI:  Body mass index is 26.45 kg/m.  Estimated Nutritional Needs:   Kcal:  1600-1800  Protein:  70-90g  Fluid:  1.6-1.8L    Josette Glance, MS, RDN, LDN Clinical Dietitian I Please reach out via secure chat

## 2023-12-31 NOTE — Progress Notes (Signed)
 PHARMACY - ANTICOAGULATION CONSULT NOTE  Pharmacy Consult for heparin Indication: MVR  Labs: Recent Labs    12/29/23 1537 12/29/23 1550 12/29/23 1828 12/29/23 2005 12/30/23 0345 12/30/23 0639 12/30/23 1553 12/30/23 2317  HGB 11.0* 12.9  --   --  10.2*  --   --   --   HCT 33.5* 38.0  --   --  30.8*  --   --   --   PLT 174  --   --   --  165  --   --   --   LABPROT 19.0*  --   --   --  20.2*  --   --   --   INR 1.5*  --   --   --  1.6*  --   --   --   HEPARINUNFRC  --   --   --   --   --  0.28* 0.36 0.53  CREATININE 1.54*  --   --  1.43*  --  1.38*  --   --   TROPONINIHS 12  --  12  --   --   --   --   --    Assessment/Plan:  78yo female remains therapeutic on heparin. Will continue infusion at current rate of 950 units/hr and monitor daily level.  Chelsea Boyer, PharmD, BCPS 12/31/2023 12:12 AM

## 2023-12-31 NOTE — Progress Notes (Signed)
 Progress Note  Patient Name: Chelsea Boyer Date of Encounter: 12/31/2023  Primary Cardiologist:   Jerel Balding, MD   Subjective   She says she has been breathing OK.  No acute SOB.  Still with abdomina pain and distension.   Inpatient Medications    Scheduled Meds:  fluticasone  1 spray Each Nare Daily   furosemide  80 mg Intravenous BID   lactulose  20 g Oral BID   levothyroxine  50 mcg Intravenous Daily   metoprolol  succinate  25 mg Oral Daily   midodrine  5 mg Oral TID WC   ondansetron (ZOFRAN) IV  4 mg Intravenous Once   rifaximin  550 mg Oral BID   sodium chloride flush  3 mL Intravenous Q12H   spironolactone   25 mg Oral Daily   Continuous Infusions:  heparin 750 Units/hr (12/31/23 0853)   PRN Meds: HYDROcodone-acetaminophen, ondansetron **OR** ondansetron (ZOFRAN) IV, sodium chloride flush   Vital Signs    Vitals:   12/30/23 1935 12/30/23 2200 12/31/23 0000 12/31/23 0400  BP: (!) 98/49 (!) 120/52 (!) 108/54 (!) 111/56  Pulse: 80 75 78 98  Resp: 20 17 14  (!) 22  Temp:   (!) 97.5 F (36.4 C) 97.6 F (36.4 C)  TempSrc:   Oral Oral  SpO2: 99% 95% 98%   Weight:      Height:        Intake/Output Summary (Last 24 hours) at 12/31/2023 1252 Last data filed at 12/31/2023 0600 Gross per 24 hour  Intake 184.37 ml  Output 800 ml  Net -615.63 ml   Filed Weights   12/29/23 1452  Weight: 63.5 kg    Telemetry    Atrial fib with controlled rate - Personally Reviewed  ECG    NA - Personally Reviewed  Physical Exam   GEN: No acute distress.   Neck: JVD to jaw at 90 degrees Cardiac: RRR, systolic murmur at the apex and radiating out the outflow tract murmurs, rubs, or gallops.  Respiratory: Clear  to auscultation bilaterally. GI: Soft, nontender, non-distended  MS:   Severe leg  edema; No deformity. Neuro:  Nonfocal  Psych: Normal affect   Labs    Chemistry Recent Labs  Lab 12/29/23 1537 12/29/23 1550 12/29/23 2005 12/30/23 0639  12/31/23 0525  NA 124*   < > 125* 125* 127*  K 5.5*   < > 5.2* 4.7 5.0  CL 93*  --  94* 93* 93*  CO2 21*  --  19* 20* 21*  GLUCOSE 83  --  75 65* 96  BUN 32*  --  31* 28* 30*  CREATININE 1.54*  --  1.43* 1.38* 1.47*  CALCIUM 9.2  --  8.8* 8.5* 8.9  PROT 8.7*  --   --  7.4 7.2  ALBUMIN 3.0*  --   --  2.6* 3.2*  AST 35  --   --  29 25  ALT 10  --   --  9 8  ALKPHOS 74  --   --  60 52  BILITOT 2.4*  --   --  2.1* 2.6*  GFRNONAA 34*  --  37* 39* 36*  ANIONGAP 10  --  12 12 13    < > = values in this interval not displayed.     Hematology Recent Labs  Lab 12/29/23 1537 12/29/23 1550 12/29/23 2005 12/30/23 0345 12/31/23 0525  WBC 4.3  --   --  4.5 5.5  RBC 4.11  --  3.62* 3.80* 3.89  HGB 11.0* 12.9  --  10.2* 10.4*  HCT 33.5* 38.0  --  30.8* 31.5*  MCV 81.5  --   --  81.1 81.0  MCH 26.8  --   --  26.8 26.7  MCHC 32.8  --   --  33.1 33.0  RDW 18.3*  --   --  18.4* 18.5*  PLT 174  --   --  165 165    Cardiac EnzymesNo results for input(s): TROPONINI in the last 168 hours. No results for input(s): TROPIPOC in the last 168 hours.   BNP Recent Labs  Lab 12/29/23 1537  BNP 639.2*     DDimer No results for input(s): DDIMER in the last 168 hours.   Radiology    ECHOCARDIOGRAM COMPLETE Result Date: 12/30/2023    ECHOCARDIOGRAM REPORT   Patient Name:   Chelsea Boyer Date of Exam: 12/30/2023 Medical Rec #:  [id number]      Height:       61.0 in Accession #:    [id number]     Weight:       140.0 lb Date of Birth:  1945-08-29      BSA:          1.623 m Patient Age:    78 years       BP:           112/61 mmHg Patient Gender: F              HR:           82 bpm. Exam Location:  Inpatient Procedure: 2D Echo (Both Spectral and Color Flow Doppler were utilized during            procedure). Indications:    CHF  History:        Patient has prior history of Echocardiogram examinations. CHF;                 Aortic Valve Disease and Mitral Valve Disease.                 Aortic Valve:  23 mm Sapien prosthetic, stented (TAVR) valve is                 present in the aortic position.                 Mitral Valve: 25 mm St. Jude mechanical valve valve is present                 in the mitral position.  Sonographer:    Norleen Amour Referring Phys: [id number] ANASTASSIA DOUTOVA IMPRESSIONS  1. Left ventricular ejection fraction, by estimation, is 55 to 60%. The left ventricle has normal function. The left ventricle has no regional wall motion abnormalities. There is mild left ventricular hypertrophy. Left ventricular diastolic function could not be evaluated.  2. Right ventricular systolic function is moderately reduced. The right ventricular size is severely enlarged. There is severely elevated pulmonary artery systolic pressure. The estimated right ventricular systolic pressure is 76.8 mmHg.  3. Left atrial size was severely dilated.  4. Right atrial size was severely dilated.  5. The mitral valve has been repaired/replaced. No evidence of mitral valve regurgitation. The mean mitral valve gradient is 7.7 mmHg. There is a 25 mm St. Jude mechanical valve present in the mitral position.  6. Tricuspid valve regurgitation is severe.  7. The aortic valve has been repaired/replaced. There is moderate calcification of the aortic  valve. There is moderate thickening of the aortic valve. Aortic valve regurgitation is mild. Moderate to severe aortic valve stenosis. There is a 23 mm Sapien prosthetic (TAVR) valve present in the aortic position.  8. The inferior vena cava is dilated in size with <50% respiratory variability, suggesting right atrial pressure of 15 mmHg. Comparison(s): Changes from prior study are noted. Aortic valve gradient has ranged from 15-20 mmHg since at least 2019 based on reports, with increase to 23.5 mmHg last year and now 33 mmHg. Visually TAVR leaflets are calcified and restricted. Stroke volume is low, which likely underestimates gradients. Suspect based on AVA and DI that TAVR has moderate  to severe stenosis. FINDINGS  Left Ventricle: Left ventricular ejection fraction, by estimation, is 55 to 60%. The left ventricle has normal function. The left ventricle has no regional wall motion abnormalities. The left ventricular internal cavity size was normal in size. There is  mild left ventricular hypertrophy. Abnormal (paradoxical) septal motion consistent with post-operative status. Left ventricular diastolic function could not be evaluated due to mitral valve replacement. Left ventricular diastolic function could not be evaluated. Right Ventricle: The right ventricular size is severely enlarged. Right vetricular wall thickness was not well visualized. Right ventricular systolic function is moderately reduced. There is severely elevated pulmonary artery systolic pressure. The tricuspid regurgitant velocity is 3.93 m/s, and with an assumed right atrial pressure of 15 mmHg, the estimated right ventricular systolic pressure is 76.8 mmHg. Left Atrium: Left atrial size was severely dilated. Right Atrium: Right atrial size was severely dilated. Pericardium: There is no evidence of pericardial effusion. Mitral Valve: The mitral valve has been repaired/replaced. No evidence of mitral valve regurgitation. There is a 25 mm St. Jude mechanical valve present in the mitral position. MV peak gradient, 17.6 mmHg. The mean mitral valve gradient is 7.7 mmHg. Tricuspid Valve: The tricuspid valve is normal in structure. Tricuspid valve regurgitation is severe. No evidence of tricuspid stenosis. Aortic Valve: Thickened/calcified leaflets of TAVR valve with restricted motion. The aortic valve has been repaired/replaced. There is moderate calcification of the aortic valve. There is moderate thickening of the aortic valve. Aortic valve regurgitation is mild. Aortic regurgitation PHT measures 550 msec. Moderate to severe aortic stenosis is present. Aortic valve mean gradient measures 33.0 mmHg. Aortic valve peak gradient measures  45.9 mmHg. Aortic valve area, by VTI measures 0.72 cm. There is a 23 mm Sapien prosthetic, stented (TAVR) valve present in the aortic position. Pulmonic Valve: The pulmonic valve was grossly normal. Pulmonic valve regurgitation is mild. No evidence of pulmonic stenosis. Aorta: The aortic root, ascending aorta, aortic arch and descending aorta are all structurally normal, with no evidence of dilitation or obstruction. Venous: The inferior vena cava is dilated in size with less than 50% respiratory variability, suggesting right atrial pressure of 15 mmHg. IAS/Shunts: The atrial septum is grossly normal.  LEFT VENTRICLE PLAX 2D LVIDd:         4.50 cm LVIDs:         3.00 cm LV PW:         1.10 cm LV IVS:        1.20 cm LVOT diam:     2.10 cm LV SV:         58 LV SV Index:   36 LVOT Area:     3.46 cm  LV Volumes (MOD) LV vol d, MOD A2C: 166.0 ml LV vol d, MOD A4C: 101.0 ml LV vol s, MOD A2C: 62.3 ml LV  vol s, MOD A4C: 46.4 ml LV SV MOD A2C:     103.7 ml LV SV MOD A4C:     101.0 ml LV SV MOD BP:      87.5 ml RIGHT VENTRICLE            IVC RV Basal diam:  5.00 cm    IVC diam: 2.20 cm RV S prime:     8.05 cm/s TAPSE (M-mode): 1.5 cm LEFT ATRIUM             Index        RIGHT ATRIUM           Index LA diam:        5.90 cm 3.64 cm/m   RA Area:     27.80 cm LA Vol (A2C):   91.0 ml 56.07 ml/m  RA Volume:   101.00 ml 62.23 ml/m LA Vol (A4C):   57.1 ml 35.18 ml/m LA Biplane Vol: 76.3 ml 47.01 ml/m  AORTIC VALVE                     PULMONIC VALVE AV Area (Vmax):    0.68 cm      PV Vmax:       1.40 m/s AV Area (Vmean):   0.68 cm      PV Peak grad:  7.8 mmHg AV Area (VTI):     0.72 cm AV Vmax:           338.80 cm/s AV Vmean:          235.600 cm/s AV VTI:            0.804 m AV Peak Grad:      45.9 mmHg AV Mean Grad:      33.0 mmHg LVOT Vmax:         66.37 cm/s LVOT Vmean:        46.500 cm/s LVOT VTI:          0.168 m LVOT/AV VTI ratio: 0.21 AI PHT:            550 msec  AORTA Ao Root diam: 2.70 cm Ao Asc diam:  2.80 cm  MITRAL VALVE               TRICUSPID VALVE MV Area (PHT): 2.27 cm    TR Peak grad:   61.8 mmHg MV Area VTI:   1.85 cm    TR Vmax:        393.00 cm/s MV Peak grad:  17.6 mmHg MV Mean grad:  7.7 mmHg    SHUNTS MV Vmax:       2.10 m/s    Systemic VTI:  0.17 m MV Vmean:      122.4 cm/s  Systemic Diam: 2.10 cm Shelda Bruckner MD Electronically signed by Shelda Bruckner MD Signature Date/Time: 12/30/2023/1:41:41 PM    Final    IR Paracentesis Result Date: 12/30/2023 INDICATION: Hx of NASH/MASH cirrhosis with ascites. Consult for her first diagnostic and therapeutic paracentesis. EXAM: ULTRASOUND GUIDED DIAGNOSTIC AND THERAPEUTIC PARACENTESIS MEDICATIONS: 7 mL 1% lidocaine COMPLICATIONS: None immediate. PROCEDURE: Informed written consent was obtained from the patient after a discussion of the risks, benefits and alternatives to treatment. A timeout was performed prior to the initiation of the procedure. Initial ultrasound scanning demonstrates a large amount of ascites within the right lower abdominal quadrant. The right lower abdomen was prepped and draped in the usual sterile fashion. 1% lidocaine was used for local anesthesia. Following this,  a 19 gauge, 7-cm, Yueh catheter was introduced. An ultrasound image was saved for documentation purposes. The paracentesis was performed. The catheter was removed and a dressing was applied. The patient tolerated the procedure well without immediate post procedural complication. Patient received post-procedure intravenous albumin; see nursing notes for details. FINDINGS: A total of approximately 10 liters of hazy dark yellow fluid was removed. Samples were sent to the laboratory as requested by the clinical team. IMPRESSION: Successful ultrasound-guided paracentesis yielding 10 liters of peritoneal fluid. Performed by: Kimble Clas, PA-C Electronically Signed   By: Cordella Banner   On: 12/30/2023 12:35   CT ABDOMEN PELVIS WO CONTRAST Result Date:  12/29/2023 CLINICAL DATA:  Acute abdominal pain.  Fatigue.  Swelling. EXAM: CT ABDOMEN AND PELVIS WITHOUT CONTRAST TECHNIQUE: Multidetector CT imaging of the abdomen and pelvis was performed following the standard protocol without IV contrast. RADIATION DOSE REDUCTION: This exam was performed according to the departmental dose-optimization program which includes automated exposure control, adjustment of the mA and/or kV according to patient size and/or use of iterative reconstruction technique. COMPARISON:  Abdominal ultrasound 08/18/2023 FINDINGS: Lower chest: Small left and trace right pleural effusion. The heart is enlarged. Circumferential left atrial calcifications. Mitral valve replacement, aortic valve replacement. Hepatobiliary: Hepatic cirrhosis with nodular contours. No obvious focal liver lesion on this unenhanced exam. Calcified gallstones within decompressed gallbladder. Pancreas: No ductal dilatation or inflammation. Spleen: No splenomegaly. The spleen is small in size measuring 7.8 cm in length. Adrenals/Urinary Tract: No adrenal nodule. Bilateral renal parenchymal thinning. No hydronephrosis. No renal calculi. Urinary bladder is only minimally distended. Stomach/Bowel: Bowel assessment is limited due to the presence of ascites. The stomach is nondistended. No small bowel obstruction or abnormal distention. The appendix is not confidently visualized. Small volume of formed stool in the colon. Short segment of transverse colon extends into a umbilical hernia. No associated wall thickening or obstruction. Sigmoid colonic diverticulosis without diverticulitis. Vascular/Lymphatic: Aortic atherosclerosis. No aortic aneurysm. Suspected retroperitoneal collaterals. Ascites limits assessment for adenopathy, no gross bulky enlarged nodes. Reproductive: Uterus and bilateral adnexa are unremarkable. Other: Large volume abdominopelvic ascites. Ascites extends into an umbilical hernia. Ascitic fluid appears  simple. There is generalized body wall edema. No free air. Musculoskeletal: Left hip arthroplasty. Lower lumbar facet hypertrophy. IMPRESSION: 1. Hepatic cirrhosis with large volume abdominopelvic ascites. 2. Cholelithiasis. 3. Short segment of transverse colon extends into a umbilical hernia. No associated wall thickening or obstruction. 4. Sigmoid colonic diverticulosis without diverticulitis. 5. Small left and trace right pleural effusions. Generalized body wall edema. Aortic Atherosclerosis (ICD10-I70.0). Electronically Signed   By: Andrea Gasman M.D.   On: 12/29/2023 17:24   CT Head Wo Contrast Result Date: 12/29/2023 EXAM: CT HEAD WITHOUT CONTRAST 12/29/2023 04:24:56 PM TECHNIQUE: CT of the head was performed without the administration of intravenous contrast. Automated exposure control, iterative reconstruction, and/or weight based adjustment of the mA/kV was utilized to reduce the radiation dose to as low as reasonably achievable. COMPARISON: None available. CLINICAL HISTORY: Mental status change, unknown cause. Table formatting from the original note was not included.; Pt BIB [facility] from home c/o confusion, fatigue, and swelling. FINDINGS: BRAIN AND VENTRICLES: No acute hemorrhage. No evidence of acute infarct. No hydrocephalus. No extra-axial collection. No mass effect or midline shift. Remote right cerebellar infarct. Patchy white matter hyperintensities, compatible with chronic small vessel ischemic change. ORBITS: No acute abnormality. SINUSES: No acute abnormality. SOFT TISSUES AND SKULL: No acute soft tissue abnormality. No skull fracture. IMPRESSION: 1. No acute intracranial abnormality.  2. 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 Electronically signed by: Gilmore Molt MD 12/29/2023 05:11 PM EDT RP Workstation: [id number]   DG Chest Port 1 View Result Date: 12/29/2023 CLINICAL DATA:  Altered mental status. EXAM: PORTABLE CHEST 1 VIEW COMPARISON:  Aug 12, 2020. FINDINGS: Stable cardiomegaly. Sternotomy wires are noted. Status post transcatheter aortic valve repair. No acute pulmonary disease is noted. Bony thorax is unremarkable. IMPRESSION: No acute abnormality seen. Electronically Signed   By: Lynwood Landy Raddle M.D.   On: 12/29/2023 16:58    Cardiac Studies   Echo above  Patient Profile     78 y.o. 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 Fairy.    Assessment & Plan    Acute on chronic diastolic HF:   Intake and output not complete.  Continue Lasix BID.  Creat seems to be tolerating this and Na is stable.  Add compression stokcings   Mechanical mitral valve:  Stable valve.  On heparin.    CKD IIIB:  Creat is relatively stable this AM.     TAVR:    Plan is to reassess and then consider right and left heart cath although it is unlikely that she would be a candidate for any further interventional procedures.      Atrial fib:  Rate is controlled.  Continue IV heparin until we decide on cath or not.    For questions or updates, please contact [facility] Please consult [email or web address] for contact info under Cardiology/STEMI.   Signed, Lynwood Schilling, MD  12/31/2023, 12:52 PM

## 2023-12-31 NOTE — Progress Notes (Signed)
 Progress Note   Subjective  Patient states she is feeling okay. Echo done yesterday, cardiology has seen patient. 10 L paracentesis yesterday.    Objective   Vital signs in last 24 hours: Temp:  [97.5 F (36.4 C)-97.6 F (36.4 C)] 97.6 F (36.4 C) (10/11 0400) Pulse Rate:  [72-98] 98 (10/11 0400) Resp:  [14-22] 22 (10/11 0400) BP: (98-120)/(46-61) 111/56 (10/11 0400) SpO2:  [95 %-100 %] 98 % (10/11 0000)   General:    white female in NAD Psych:  Cooperative. Normal mood and affect.  Intake/Output from previous day: 10/10 0701 - 10/11 0700 In: 184.4 [I.V.:167.1; IV Piggyback:17.3] Out: 800 [Urine:800] Intake/Output this shift: No intake/output data recorded.  Lab Results: Recent Labs    12/29/23 1537 12/29/23 1550 12/30/23 0345 12/31/23 0525  WBC 4.3  --  4.5 5.5  HGB 11.0* 12.9 10.2* 10.4*  HCT 33.5* 38.0 30.8* 31.5*  PLT 174  --  165 165   BMET Recent Labs    12/29/23 2005 12/30/23 0639 12/31/23 0525  NA 125* 125* 127*  K 5.2* 4.7 5.0  CL 94* 93* 93*  CO2 19* 20* 21*  GLUCOSE 75 65* 96  BUN 31* 28* 30*  CREATININE 1.43* 1.38* 1.47*  CALCIUM 8.8* 8.5* 8.9   LFT Recent Labs    12/31/23 0525  PROT 7.2  ALBUMIN 3.2*  AST 25  ALT 8  ALKPHOS 52  BILITOT 2.6*   PT/INR Recent Labs    12/30/23 0345 12/31/23 0525  LABPROT 20.2* 17.0*  INR 1.6* 1.3*    Studies/Results: ECHOCARDIOGRAM COMPLETE Result Date: 12/30/2023    ECHOCARDIOGRAM REPORT   Patient Name:   MASSIE COGLIANO Lorence Date of Exam: 12/30/2023 Medical Rec #:  [id number]      Height:       61.0 in Accession #:    [id number]     Weight:       140.0 lb Date of Birth:  [birth year]      BSA:          1.623 m Patient Age:    78 years       BP:           112/61 mmHg Patient Gender: F              HR:           82 bpm. Exam Location:  Inpatient Procedure: 2D Echo (Both Spectral and Color Flow Doppler were utilized during            procedure). Indications:    CHF  History:        Patient has prior  history of Echocardiogram examinations. CHF;                 Aortic Valve Disease and Mitral Valve Disease.                 Aortic Valve: 23 mm Sapien prosthetic, stented (TAVR) valve is                 present in the aortic position.                 Mitral Valve: 25 mm St. Jude mechanical valve valve is present                 in the mitral position.  Sonographer:    Norleen Amour Referring Phys: [id number] ANASTASSIA DOUTOVA IMPRESSIONS  1. Left ventricular ejection fraction, by estimation,  is 55 to 60%. The left ventricle has normal function. The left ventricle has no regional wall motion abnormalities. There is mild left ventricular hypertrophy. Left ventricular diastolic function could not be evaluated.  2. Right ventricular systolic function is moderately reduced. The right ventricular size is severely enlarged. There is severely elevated pulmonary artery systolic pressure. The estimated right ventricular systolic pressure is 76.8 mmHg.  3. Left atrial size was severely dilated.  4. Right atrial size was severely dilated.  5. The mitral valve has been repaired/replaced. No evidence of mitral valve regurgitation. The mean mitral valve gradient is 7.7 mmHg. There is a 25 mm St. Jude mechanical valve present in the mitral position.  6. Tricuspid valve regurgitation is severe.  7. The aortic valve has been repaired/replaced. There is moderate calcification of the aortic valve. There is moderate thickening of the aortic valve. Aortic valve regurgitation is mild. Moderate to severe aortic valve stenosis. There is a 23 mm Sapien prosthetic (TAVR) valve present in the aortic position.  8. The inferior vena cava is dilated in size with <50% respiratory variability, suggesting right atrial pressure of 15 mmHg. Comparison(s): Changes from prior study are noted. Aortic valve gradient has ranged from 15-20 mmHg since at least 2019 based on reports, with increase to 23.5 mmHg last year and now 33 mmHg. Visually TAVR leaflets are  calcified and restricted. Stroke volume is low, which likely underestimates gradients. Suspect based on AVA and DI that TAVR has moderate to severe stenosis. FINDINGS  Left Ventricle: Left ventricular ejection fraction, by estimation, is 55 to 60%. The left ventricle has normal function. The left ventricle has no regional wall motion abnormalities. The left ventricular internal cavity size was normal in size. There is  mild left ventricular hypertrophy. Abnormal (paradoxical) septal motion consistent with post-operative status. Left ventricular diastolic function could not be evaluated due to mitral valve replacement. Left ventricular diastolic function could not be evaluated. Right Ventricle: The right ventricular size is severely enlarged. Right vetricular wall thickness was not well visualized. Right ventricular systolic function is moderately reduced. There is severely elevated pulmonary artery systolic pressure. The tricuspid regurgitant velocity is 3.93 m/s, and with an assumed right atrial pressure of 15 mmHg, the estimated right ventricular systolic pressure is 76.8 mmHg. Left Atrium: Left atrial size was severely dilated. Right Atrium: Right atrial size was severely dilated. Pericardium: There is no evidence of pericardial effusion. Mitral Valve: The mitral valve has been repaired/replaced. No evidence of mitral valve regurgitation. There is a 25 mm St. Jude mechanical valve present in the mitral position. MV peak gradient, 17.6 mmHg. The mean mitral valve gradient is 7.7 mmHg. Tricuspid Valve: The tricuspid valve is normal in structure. Tricuspid valve regurgitation is severe. No evidence of tricuspid stenosis. Aortic Valve: Thickened/calcified leaflets of TAVR valve with restricted motion. The aortic valve has been repaired/replaced. There is moderate calcification of the aortic valve. There is moderate thickening of the aortic valve. Aortic valve regurgitation is mild. Aortic regurgitation PHT measures  550 msec. Moderate to severe aortic stenosis is present. Aortic valve mean gradient measures 33.0 mmHg. Aortic valve peak gradient measures 45.9 mmHg. Aortic valve area, by VTI measures 0.72 cm. There is a 23 mm Sapien prosthetic, stented (TAVR) valve present in the aortic position. Pulmonic Valve: The pulmonic valve was grossly normal. Pulmonic valve regurgitation is mild. No evidence of pulmonic stenosis. Aorta: The aortic root, ascending aorta, aortic arch and descending aorta are all structurally normal, with no evidence of dilitation  or obstruction. Venous: The inferior vena cava is dilated in size with less than 50% respiratory variability, suggesting right atrial pressure of 15 mmHg. IAS/Shunts: The atrial septum is grossly normal.  LEFT VENTRICLE PLAX 2D LVIDd:         4.50 cm LVIDs:         3.00 cm LV PW:         1.10 cm LV IVS:        1.20 cm LVOT diam:     2.10 cm LV SV:         58 LV SV Index:   36 LVOT Area:     3.46 cm  LV Volumes (MOD) LV vol d, MOD A2C: 166.0 ml LV vol d, MOD A4C: 101.0 ml LV vol s, MOD A2C: 62.3 ml LV vol s, MOD A4C: 46.4 ml LV SV MOD A2C:     103.7 ml LV SV MOD A4C:     101.0 ml LV SV MOD BP:      87.5 ml RIGHT VENTRICLE            IVC RV Basal diam:  5.00 cm    IVC diam: 2.20 cm RV S prime:     8.05 cm/s TAPSE (M-mode): 1.5 cm LEFT ATRIUM             Index        RIGHT ATRIUM           Index LA diam:        5.90 cm 3.64 cm/m   RA Area:     27.80 cm LA Vol (A2C):   91.0 ml 56.07 ml/m  RA Volume:   101.00 ml 62.23 ml/m LA Vol (A4C):   57.1 ml 35.18 ml/m LA Biplane Vol: 76.3 ml 47.01 ml/m  AORTIC VALVE                     PULMONIC VALVE AV Area (Vmax):    0.68 cm      PV Vmax:       1.40 m/s AV Area (Vmean):   0.68 cm      PV Peak grad:  7.8 mmHg AV Area (VTI):     0.72 cm AV Vmax:           338.80 cm/s AV Vmean:          235.600 cm/s AV VTI:            0.804 m AV Peak Grad:      45.9 mmHg AV Mean Grad:      33.0 mmHg LVOT Vmax:         66.37 cm/s LVOT Vmean:        46.500  cm/s LVOT VTI:          0.168 m LVOT/AV VTI ratio: 0.21 AI PHT:            550 msec  AORTA Ao Root diam: 2.70 cm Ao Asc diam:  2.80 cm MITRAL VALVE               TRICUSPID VALVE MV Area (PHT): 2.27 cm    TR Peak grad:   61.8 mmHg MV Area VTI:   1.85 cm    TR Vmax:        393.00 cm/s MV Peak grad:  17.6 mmHg MV Mean grad:  7.7 mmHg    SHUNTS MV Vmax:       2.10 m/s    Systemic VTI:  0.17 m MV Vmean:      122.4 cm/s  Systemic Diam: 2.10 cm Shelda Bruckner MD Electronically signed by Shelda Bruckner MD Signature Date/Time: 12/30/2023/1:41:41 PM    Final    IR Paracentesis Result Date: 12/30/2023 INDICATION: Hx of NASH/MASH cirrhosis with ascites. Consult for her first diagnostic and therapeutic paracentesis. EXAM: ULTRASOUND GUIDED DIAGNOSTIC AND THERAPEUTIC PARACENTESIS MEDICATIONS: 7 mL 1% lidocaine COMPLICATIONS: None immediate. PROCEDURE: Informed written consent was obtained from the patient after a discussion of the risks, benefits and alternatives to treatment. A timeout was performed prior to the initiation of the procedure. Initial ultrasound scanning demonstrates a large amount of ascites within the right lower abdominal quadrant. The right lower abdomen was prepped and draped in the usual sterile fashion. 1% lidocaine was used for local anesthesia. Following this, a 19 gauge, 7-cm, Yueh catheter was introduced. An ultrasound image was saved for documentation purposes. The paracentesis was performed. The catheter was removed and a dressing was applied. The patient tolerated the procedure well without immediate post procedural complication. Patient received post-procedure intravenous albumin; see nursing notes for details. FINDINGS: A total of approximately 10 liters of hazy dark yellow fluid was removed. Samples were sent to the laboratory as requested by the clinical team. IMPRESSION: Successful ultrasound-guided paracentesis yielding 10 liters of peritoneal fluid. Performed by: Kimble Clas, PA-C Electronically Signed   By: Cordella Banner   On: 12/30/2023 12:35   CT ABDOMEN PELVIS WO CONTRAST Result Date: 12/29/2023 CLINICAL DATA:  Acute abdominal pain.  Fatigue.  Swelling. EXAM: CT ABDOMEN AND PELVIS WITHOUT CONTRAST TECHNIQUE: Multidetector CT imaging of the abdomen and pelvis was performed following the standard protocol without IV contrast. RADIATION DOSE REDUCTION: This exam was performed according to the departmental dose-optimization program which includes automated exposure control, adjustment of the mA and/or kV according to patient size and/or use of iterative reconstruction technique. COMPARISON:  Abdominal ultrasound 08/18/2023 FINDINGS: Lower chest: Small left and trace right pleural effusion. The heart is enlarged. Circumferential left atrial calcifications. Mitral valve replacement, aortic valve replacement. Hepatobiliary: Hepatic cirrhosis with nodular contours. No obvious focal liver lesion on this unenhanced exam. Calcified gallstones within decompressed gallbladder. Pancreas: No ductal dilatation or inflammation. Spleen: No splenomegaly. The spleen is small in size measuring 7.8 cm in length. Adrenals/Urinary Tract: No adrenal nodule. Bilateral renal parenchymal thinning. No hydronephrosis. No renal calculi. Urinary bladder is only minimally distended. Stomach/Bowel: Bowel assessment is limited due to the presence of ascites. The stomach is nondistended. No small bowel obstruction or abnormal distention. The appendix is not confidently visualized. Small volume of formed stool in the colon. Short segment of transverse colon extends into a umbilical hernia. No associated wall thickening or obstruction. Sigmoid colonic diverticulosis without diverticulitis. Vascular/Lymphatic: Aortic atherosclerosis. No aortic aneurysm. Suspected retroperitoneal collaterals. Ascites limits assessment for adenopathy, no gross bulky enlarged nodes. Reproductive: Uterus and bilateral adnexa  are unremarkable. Other: Large volume abdominopelvic ascites. Ascites extends into an umbilical hernia. Ascitic fluid appears simple. There is generalized body wall edema. No free air. Musculoskeletal: Left hip arthroplasty. Lower lumbar facet hypertrophy. IMPRESSION: 1. Hepatic cirrhosis with large volume abdominopelvic ascites. 2. Cholelithiasis. 3. Short segment of transverse colon extends into a umbilical hernia. No associated wall thickening or obstruction. 4. Sigmoid colonic diverticulosis without diverticulitis. 5. Small left and trace right pleural effusions. Generalized body wall edema. Aortic Atherosclerosis (ICD10-I70.0). Electronically Signed   By: Andrea Gasman M.D.   On: 12/29/2023 17:24   CT Head Wo Contrast Result Date: 12/29/2023  EXAM: CT HEAD WITHOUT CONTRAST 12/29/2023 04:24:56 PM TECHNIQUE: CT of the head was performed without the administration of intravenous contrast. Automated exposure control, iterative reconstruction, and/or weight based adjustment of the mA/kV was utilized to reduce the radiation dose to as low as reasonably achievable. COMPARISON: None available. CLINICAL HISTORY: Mental status change, unknown cause. Table formatting from the original note was not included.; Pt BIB [facility] from home c/o confusion, fatigue, and swelling. FINDINGS: BRAIN AND VENTRICLES: No acute hemorrhage. No evidence of acute infarct. No hydrocephalus. No extra-axial collection. No mass effect or midline shift. Remote right cerebellar infarct. Patchy white matter hyperintensities, compatible with chronic small vessel ischemic change. ORBITS: No acute abnormality. SINUSES: No acute abnormality. SOFT TISSUES AND SKULL: No acute soft tissue abnormality. No skull fracture. IMPRESSION: 1. No acute intracranial abnormality. 2. 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 Electronically signed by: Gilmore Molt MD 12/29/2023 05:11 PM EDT RP Workstation: [id number]   DG Chest  Port 1 View Result Date: 12/29/2023 CLINICAL DATA:  Altered mental status. EXAM: PORTABLE CHEST 1 VIEW COMPARISON:  Aug 12, 2020. FINDINGS: Stable cardiomegaly. Sternotomy wires are noted. Status post transcatheter aortic valve repair. No acute pulmonary disease is noted. Bony thorax is unremarkable. IMPRESSION: No acute abnormality seen. Electronically Signed   By: Lynwood Landy Raddle M.D.   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  Total protein of ascitic fluid however is 4.4, this is most consistent with cardiogenic ascites.  I think her worsening heart failure is more than likely the cause of her ascites rather than her cirrhosis based on this analysis.  We discussed this for a bit this morning.  Defer management of this to her cardiology team.  She is getting IV Lasix, close monitoring of renal function.  She is scheduled for right and left heart cath per cardiology.  For now, defer diuretic management and management of heart failure to cardiology.  Agree with lactulose and rifaximin to treat any component of hepatic encephalopathy.  She is on heparin drip while Coumadin  is subtherapeutic.  Platelets are normal but I do think she would benefit from an EGD as outpatient for varices screening given need for further anticoagulation.  Otherwise encourage good nutrition.  Given this primarily appears to be cardiac driven issue at this time, we will sign off for now and coordinate follow-up with Dr. Legrand as outpatient upon discharge.  Call with questions.  Marcey Naval, MD [facility]

## 2024-01-01 DIAGNOSIS — I5033 Acute on chronic diastolic (congestive) heart failure: Secondary | ICD-10-CM | POA: Diagnosis not present

## 2024-01-01 DIAGNOSIS — K7460 Unspecified cirrhosis of liver: Secondary | ICD-10-CM | POA: Diagnosis not present

## 2024-01-01 DIAGNOSIS — E871 Hypo-osmolality and hyponatremia: Secondary | ICD-10-CM | POA: Diagnosis not present

## 2024-01-01 DIAGNOSIS — K7290 Hepatic failure, unspecified without coma: Secondary | ICD-10-CM | POA: Diagnosis not present

## 2024-01-01 DIAGNOSIS — R188 Other ascites: Secondary | ICD-10-CM | POA: Diagnosis not present

## 2024-01-01 LAB — HEPARIN LEVEL (UNFRACTIONATED)
Heparin Unfractionated: 0.28 [IU]/mL — ABNORMAL LOW (ref 0.30–0.70)
Heparin Unfractionated: 0.31 [IU]/mL (ref 0.30–0.70)
Heparin Unfractionated: 0.43 [IU]/mL (ref 0.30–0.70)

## 2024-01-01 LAB — T3: T3, Total: 54 ng/dL — ABNORMAL LOW (ref 71–180)

## 2024-01-01 LAB — COMPREHENSIVE METABOLIC PANEL WITH GFR
ALT: 8 U/L (ref 0–44)
AST: 27 U/L (ref 15–41)
Albumin: 2.8 g/dL — ABNORMAL LOW (ref 3.5–5.0)
Alkaline Phosphatase: 51 U/L (ref 38–126)
Anion gap: 9 (ref 5–15)
BUN: 29 mg/dL — ABNORMAL HIGH (ref 8–23)
CO2: 22 mmol/L (ref 22–32)
Calcium: 8.5 mg/dL — ABNORMAL LOW (ref 8.9–10.3)
Chloride: 95 mmol/L — ABNORMAL LOW (ref 98–111)
Creatinine, Ser: 1.39 mg/dL — ABNORMAL HIGH (ref 0.44–1.00)
GFR, Estimated: 39 mL/min — ABNORMAL LOW (ref >60)
Glucose, Bld: 109 mg/dL — ABNORMAL HIGH (ref 70–99)
Potassium: 4.2 mmol/L (ref 3.5–5.1)
Sodium: 126 mmol/L — ABNORMAL LOW (ref 135–145)
Total Bilirubin: 2.5 mg/dL — ABNORMAL HIGH (ref 0.0–1.2)
Total Protein: 6.7 g/dL (ref 6.5–8.1)

## 2024-01-01 LAB — PROTIME-INR
INR: 2 — ABNORMAL HIGH (ref 0.8–1.2)
Prothrombin Time: 23.5 s — ABNORMAL HIGH (ref 11.4–15.2)

## 2024-01-01 LAB — CBC
HCT: 31.3 % — ABNORMAL LOW (ref 36.0–46.0)
Hemoglobin: 10.5 g/dL — ABNORMAL LOW (ref 12.0–15.0)
MCH: 26.8 pg (ref 26.0–34.0)
MCHC: 33.5 g/dL (ref 30.0–36.0)
MCV: 79.8 fL — ABNORMAL LOW (ref 80.0–100.0)
Platelets: 147 K/uL — ABNORMAL LOW (ref 150–400)
RBC: 3.92 MIL/uL (ref 3.87–5.11)
RDW: 18.3 % — ABNORMAL HIGH (ref 11.5–15.5)
WBC: 7.6 K/uL (ref 4.0–10.5)
nRBC: 0 % (ref 0.0–0.2)

## 2024-01-01 NOTE — Plan of Care (Signed)
   Problem: Education: Goal: Knowledge of General Education information will improve Description: Including pain rating scale, medication(s)/side effects and non-pharmacologic comfort measures Outcome: Progressing   Problem: Health Behavior/Discharge Planning: Goal: Ability to manage health-related needs will improve Outcome: Progressing   Problem: Clinical Measurements: Goal: Ability to maintain clinical measurements within normal limits will improve Outcome: Progressing Goal: Will remain free from infection Outcome: Progressing Goal: Diagnostic test results will improve Outcome: Progressing Goal: Respiratory complications will improve Outcome: Progressing Goal: Cardiovascular complication will be avoided Outcome: Progressing   Problem: Activity: Goal: Risk for activity intolerance will decrease Outcome: Progressing   Problem: Nutrition: Goal: Adequate nutrition will be maintained Outcome: Progressing   Problem: Coping: Goal: Level of anxiety will decrease Outcome: Progressing   Problem: Elimination: Goal: Will not experience complications related to bowel motility Outcome: Progressing Goal: Will not experience complications related to urinary retention Outcome: Progressing   Problem: Pain Managment: Goal: General experience of comfort will improve and/or be controlled Outcome: Progressing   Problem: Safety: Goal: Ability to remain free from injury will improve Outcome: Progressing   Problem: Skin Integrity: Goal: Risk for impaired skin integrity will decrease Outcome: Progressing   Problem: Education: Goal: Ability to demonstrate management of disease process will improve Outcome: Progressing Goal: Ability to verbalize understanding of medication therapies will improve Outcome: Progressing Goal: Individualized Educational Video(s) Outcome: Progressing   Problem: Activity: Goal: Capacity to carry out activities will improve Outcome: Progressing    Problem: Cardiac: Goal: Ability to achieve and maintain adequate cardiopulmonary perfusion will improve Outcome: Progressing   Problem: Clinical Measurements: Goal: Ability to avoid or minimize complications of infection will improve Outcome: Progressing   Problem: Skin Integrity: Goal: Skin integrity will improve Outcome: Progressing

## 2024-01-01 NOTE — Progress Notes (Signed)
 Mobility Specialist Progress Note:   01/01/24 1218  Mobility  Activity Ambulated with assistance  Level of Assistance Minimal assist, patient does 75% or more  Assistive Device Front wheel walker  Distance Ambulated (ft) 60 ft  Activity Response Tolerated well  Mobility Referral Yes  Mobility visit 1 Mobility  Mobility Specialist Start Time (ACUTE ONLY) [id number]  Mobility Specialist Stop Time (ACUTE ONLY) 1000  Mobility Specialist Time Calculation (min) (ACUTE ONLY) 18 min   Pt received in bed agreeable to mobility. MinA required for bed mobility and contact for STS/ ambulation. No c/o throughout. Returned to room w/o fault. Left supine in bed w/ call bell and personal belongings in reach. All needs met.   Pre Mobility BP 100/55 (69) During Mobility BP seated EOB 107/76 (84)                          BP standing at bedside 102/68 (80)                          BP standing at EOS 103/68 (82)   Thersia Minder Mobility Specialist  Please contact vis Secure Chat or  Rehab Office [phone]

## 2024-01-01 NOTE — Progress Notes (Signed)
 PHARMACY - ANTICOAGULATION CONSULT NOTE  Pharmacy Consult for Heparin Indication: mechanical mitral valve and subtherapeutic INR on warfarin  No Known Allergies  Patient Measurements: Height: 5' 1 (154.9 cm) Weight: 62.9 kg (138 lb 11.2 oz) IBW/kg (Calculated) : 47.8 HEPARIN DW (KG): 60.9  Vital Signs: Temp: 97.9 F (36.6 C) (10/12 0003) Temp Source: Oral (10/12 0003) BP: 113/53 (10/12 0003) Pulse Rate: 76 (10/12 0003)  Labs: Recent Labs    12/29/23 1537 12/29/23 1550 12/29/23 1828 12/29/23 2005 12/30/23 0345 12/30/23 0639 12/30/23 1553 12/31/23 0525 12/31/23 1457 12/31/23 2337  HGB 11.0* 12.9  --   --  10.2*  --   --  10.4*  --   --   HCT 33.5* 38.0  --   --  30.8*  --   --  31.5*  --   --   PLT 174  --   --   --  165  --   --  165  --   --   LABPROT 19.0*  --   --   --  20.2*  --   --  17.0*  --   --   INR 1.5*  --   --   --  1.6*  --   --  1.3*  --   --   HEPARINUNFRC  --   --   --   --   --  0.28*   < > >1.10* <0.10* 0.28*  CREATININE 1.54*  --   --  1.43*  --  1.38*  --  1.47*  --   --   TROPONINIHS 12  --  12  --   --   --   --   --   --   --    < > = values in this interval not displayed.   Estimated Creatinine Clearance: 26.4 mL/min (A) (by C-G formula based on SCr of 1.47 mg/dL (H)).  Medical History: Past Medical History:  Diagnosis Date   Atrial fibrillation (HCC)    Heart disease    aortic and mitral valve problems   Hepatic cirrhosis (HCC)    Hypothyroidism     Medications:  Scheduled:   fluticasone  1 spray Each Nare Daily   furosemide  80 mg Intravenous BID   lactulose  20 g Oral BID   levothyroxine  50 mcg Intravenous Daily   metoprolol  succinate  25 mg Oral Daily   midodrine  5 mg Oral TID WC   ondansetron (ZOFRAN) IV  4 mg Intravenous Once   rifaximin  550 mg Oral BID   sodium chloride flush  3 mL Intravenous Q12H   spironolactone   25 mg Oral Daily  Infusions:   heparin 850 Units/hr (12/31/23 1557)    Assessment: Patient is a  78 YO F presenting with abdominal distension, peripheral edema, and fatigue. 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 According to last anticoag clinic note on 9/25, warfarin regimen was one-half 5 mg tablet daily and INR was 3 at that time. Warfarin on hold for possible R/L cardiac cath on Monday 10/13.   Heparin level supratherapeutic at >1.10, previously confirmed therapeutic. Visited patient at bedside to confirm labs drawn from opposite arm of site of infusion. Hgb 10.4 and PLTs 165.   PM update: heparin level slightly below goal on 850 units/hr- was drawn from opposite arm. No issues with the infusion or bleeding reported per RN.   Goal of Therapy:  Heparin level 0.3-0.7 units/ml Monitor platelets by anticoagulation protocol: Yes   Plan:  Increase Heparin infusion back to 950 units/hr.  Check heparin level in 8 hours Warfarin on hold Monitor heparin levels, CBC, INR daily.   Thank you for allowing pharmacy to be part of this patients care team.  Lynwood Poplar, PharmD, BCPS Clinical Pharmacist 01/01/2024 1:36 AM

## 2024-01-01 NOTE — Progress Notes (Signed)
 PROGRESS NOTE        PATIENT DETAILS Name: Chelsea Boyer Age: 78 y.o. Sex: female Date of Birth: 11-10-1945 Admit Date: 12/29/2023 Admitting Physician Sigurd Pac, MD ERE:[email or web address], Lamarr RAMAN, MD  Brief Summary: Patient is a 78 y.o.  female with history of Hollie liver cirrhosis, chronic HFpEF/RV failure, A-fib on Coumadin , mechanical mitral valve replacement, TAVR-who presented with anasarca-thought to be secondary to combination of decompensated liver cirrhosis and RV failure.  Significant events: 10/9>> admit to [facility]  Significant studies: 10/9>> CT head: No acute intracranial abnormality. 10/9>> CT abdomen/pelvis: Cirrhosis-large volume ascites. 10/9>> CXR: No acute abnormality. 10/10>> echo: EF 55-60%, RV systolic function moderately reduced, RVSP 76.8.  Moderate to severe aortic valve stenosis.  Significant microbiology data: 10/9>> COVID/influenza/RSV PCR: Negative 10/9>> blood culture: No growth  Procedures: 10/10>> paracentesis (WBC 105,8% neutrophils).  Consults: GI Cardiology  Subjective: Essentially unchanged-no major issues overnight.  Objective: Vitals: Blood pressure (!) 100/55, pulse 80, temperature 97.9 F (36.6 C), temperature source Oral, resp. rate 15, height 5' 1 (1.549 m), weight 62.9 kg, SpO2 95%.   Exam: Gen Exam:Alert awake-not in any distress HEENT:atraumatic, normocephalic Chest: B/L clear to auscultation anteriorly CVS:S1S2 regular Abdomen:soft non tender, slightly distended-with dullness in the flanks. Extremities:+ edema Neurology: Non focal Skin: no rash  Pertinent Labs/Radiology:    Latest Ref Rng & Units 01/01/2024    5:27 AM 12/31/2023    5:25 AM 12/30/2023    3:45 AM  CBC  WBC 4.0 - 10.5 K/uL 7.6  5.5  4.5   Hemoglobin 12.0 - 15.0 g/dL 89.4  89.5  89.7   Hematocrit 36.0 - 46.0 % 31.3  31.5  30.8   Platelets 150 - 400 K/uL 147  165  165     Lab Results  Component Value Date   NA 126 (L)  01/01/2024   K 4.2 01/01/2024   CL 95 (L) 01/01/2024   CO2 22 01/01/2024      Assessment/Plan: Anasarca/ascites Secondary to decompensated liver cirrhosis and HFpEF/RV failure exacerbation. Volume status gradually improving-continue continue diuretics-Lasix 80 mg twice daily/Aldactone  25 daily Daily weights/intake/output Monitor electrolytes  Acute on chronic HFpEF RV failure Pulmonary hypertension Contributing to massive anasarca Volume status gradually improving-on midodrine for BP support.   Continue diuretics-watch electrolytes/intake/output/weights. 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 Probably exacerbating RV failure Cardiology contemplating LHC/RHC-for hemodynamic readings/pressure gradients Per cardiology-will not be a candidate for aggressive care including redo TAVR. May need involvement by palliative care at some point in time.  Hyponatremia Secondary to hypervolemia-from CHF/liver cirrhosis Thankfully asymptomatic. Continue diuresis Fluid restrict Follow electrolytes.  Decompensated NASH liver cirrhosis Continue diuretics S/p paracentesis 10/10-no evidence of SBP Awake/alert-no evidence of encephalopathy-continue lactulose/rifaximin.  Chronic A-fib Rate controlled-metoprolol  Coumadin  on hold-on IV heparin in anticipation of possible RHC/LHC next week.  History of mechanical mitral valve replacement Coumadin  on hold-IV heparin Thankfully mechanical mitral valve appears stable on echo.  Hypothyroidism TSH elevated-19.5 on 10/9-felt to be due to poor absorption of Synthroid through edematous cut Currently on IV Synthroid-switch to oral route once volume status improves.  Normocytic anemia Likely secondary to acute illness superimposed on underlying chronic disease Monitor Hb  Debility/deconditioning PT/OT eval  Palliative care/goals of care Full code for  now Briefly discussed with patient regarding complexity of her underlying  issues-risk of recurrence-no good long-term solutions.  For now continue diuresis-await further cardiology input-await potential RHC/LHC next week-but suspect will need palliative care evaluation at some point.  Code status:   Code Status: Full Code   DVT Prophylaxis: Place TED hose Start: 12/31/23 1303 SCDs Start: 12/30/23 0511 IV Heparin   Family Communication: None at bedside   Disposition Plan: Status is: Inpatient Remains inpatient appropriate because: Severity of illness   Planned Discharge Destination:Home health   Diet: Diet Order             Diet 2 gram sodium Room service appropriate? Yes; Fluid consistency: Thin; Fluid restriction: 1500 mL Fluid  Diet effective now                     Antimicrobial agents: Anti-infectives (From admission, onward)    Start     Dose/Rate Route Frequency Ordered Stop   12/30/23 1030  rifaximin (XIFAXAN) tablet 550 mg        550 mg Oral 2 times daily 12/30/23 1029     12/30/23 0200  doxycycline (VIBRA-TABS) tablet 100 mg  Status:  Discontinued        100 mg Oral Every 12 hours 12/29/23 2347 12/30/23 1244        MEDICATIONS: Scheduled Meds:  fluticasone  1 spray Each Nare Daily   furosemide  80 mg Intravenous BID   lactulose  20 g Oral BID   levothyroxine  50 mcg Intravenous Daily   metoprolol  succinate  25 mg Oral Daily   midodrine  5 mg Oral TID WC   ondansetron (ZOFRAN) IV  4 mg Intravenous Once   rifaximin  550 mg Oral BID   sodium chloride flush  3 mL Intravenous Q12H   spironolactone   25 mg Oral Daily   Continuous Infusions:  heparin 950 Units/hr (01/01/24 0903)   PRN Meds:.HYDROcodone-acetaminophen, ondansetron **OR** ondansetron (ZOFRAN) IV, sodium chloride flush   I have personally reviewed following labs and imaging studies  LABORATORY DATA: CBC: Recent Labs  Lab 12/29/23 1537 12/29/23 1550 12/30/23 0345 12/31/23 0525  01/01/24 0527  WBC 4.3  --  4.5 5.5 7.6  NEUTROABS 3.0  --   --   --   --   HGB 11.0* 12.9 10.2* 10.4* 10.5*  HCT 33.5* 38.0 30.8* 31.5* 31.3*  MCV 81.5  --  81.1 81.0 79.8*  PLT 174  --  165 165 147*    Basic Metabolic Panel: Recent Labs  Lab 12/29/23 1537 12/29/23 1550 12/29/23 2005 12/30/23 0639 12/31/23 0525 01/01/24 0527  NA 124* 127* 125* 125* 127* 126*  K 5.5* 5.5* 5.2* 4.7 5.0 4.2  CL 93*  --  94* 93* 93* 95*  CO2 21*  --  19* 20* 21* 22  GLUCOSE 83  --  75 65* 96 109*  BUN 32*  --  31* 28* 30* 29*  CREATININE 1.54*  --  1.43* 1.38* 1.47* 1.39*  CALCIUM 9.2  --  8.8* 8.5* 8.9 8.5*  MG 2.0  --   --  1.8  --   --   PHOS  --   --   --  3.4  --   --     GFR: Estimated Creatinine Clearance: 27.9 mL/min (A) (by C-G formula based on SCr of 1.39 mg/dL (H)).  Liver Function Tests: Recent Labs  Lab 12/29/23 1537 12/30/23 0639 12/31/23 0525 01/01/24 0527  AST 35 29 25 27   ALT 10 9 8 8   ALKPHOS 74  60 52 51  BILITOT 2.4* 2.1* 2.6* 2.5*  PROT 8.7* 7.4 7.2 6.7  ALBUMIN 3.0* 2.6* 3.2* 2.8*   No results for input(s): LIPASE, AMYLASE in the last 168 hours. Recent Labs  Lab 12/29/23 1537  AMMONIA 15    Coagulation Profile: Recent Labs  Lab 12/29/23 1537 12/30/23 0345 12/31/23 0525 01/01/24 0527  INR 1.5* 1.6* 1.3* 2.0*    Cardiac Enzymes: No results for input(s): CKTOTAL, CKMB, CKMBINDEX, TROPONINI in the last 168 hours.  BNP (last 3 results) No results for input(s): PROBNP in the last 8760 hours.  Lipid Profile: No results for input(s): CHOL, HDL, LDLCALC, TRIG, CHOLHDL, LDLDIRECT in the last 72 hours.  Thyroid Function Tests: Recent Labs    12/29/23 2005  TSH 19.514*  FREET4 1.26*    Anemia Panel: Recent Labs    12/29/23 2005 12/30/23 0345  VITAMINB12  --  1,469*  FOLATE  --  10.1  FERRITIN 251  --   TIBC 372  --   IRON 39  --   RETICCTPCT 1.4  --     Urine analysis:    Component Value Date/Time    COLORURINE YELLOW 12/29/2023 1900   APPEARANCEUR CLEAR 12/29/2023 1900   LABSPEC 1.009 12/29/2023 1900   PHURINE 5.0 12/29/2023 1900   GLUCOSEU NEGATIVE 12/29/2023 1900   HGBUR NEGATIVE 12/29/2023 1900   BILIRUBINUR NEGATIVE 12/29/2023 1900   KETONESUR NEGATIVE 12/29/2023 1900   PROTEINUR NEGATIVE 12/29/2023 1900   NITRITE NEGATIVE 12/29/2023 1900   LEUKOCYTESUR NEGATIVE 12/29/2023 1900    Sepsis Labs: Lactic Acid, Venous No results found for: LATICACIDVEN  MICROBIOLOGY: Recent Results (from the past 240 hours)  Resp panel by RT-PCR (RSV, Flu A&B, Covid) Anterior Nasal Swab     Status: None   Collection Time: 12/29/23  3:37 PM   Specimen: Anterior Nasal Swab  Result Value Ref Range Status   SARS Coronavirus 2 by RT PCR NEGATIVE NEGATIVE Final   Influenza A by PCR NEGATIVE NEGATIVE Final   Influenza B by PCR NEGATIVE NEGATIVE Final    Comment: (NOTE) The Xpert Xpress SARS-CoV-2/FLU/RSV plus assay is intended as an aid in the diagnosis of influenza from Nasopharyngeal swab specimens and should not be used as a sole basis for treatment. Nasal washings and aspirates are unacceptable for Xpert Xpress SARS-CoV-2/FLU/RSV testing.  Fact Sheet for Patients: BloggerCourse.com  Fact Sheet for Healthcare Providers: SeriousBroker.it  This test is not yet approved or cleared by the [place]  FDA and has been authorized for detection and/or diagnosis of SARS-CoV-2 by FDA under an Emergency Use Authorization (EUA). This EUA will remain in effect (meaning this test can be used) for the duration of the COVID-19 declaration under Section 564(b)(1) of the Act, 21 U.S.C. section 360bbb-3(b)(1), unless the authorization is terminated or revoked.     Resp Syncytial Virus by PCR NEGATIVE NEGATIVE Final    Comment: (NOTE) Fact Sheet for Patients: BloggerCourse.com  Fact Sheet for Healthcare  Providers: SeriousBroker.it  This test is not yet approved or cleared by the [place]  FDA and has been authorized for detection and/or diagnosis of SARS-CoV-2 by FDA under an Emergency Use Authorization (EUA). This EUA will remain in effect (meaning this test can be used) for the duration of the COVID-19 declaration under Section 564(b)(1) of the Act, 21 U.S.C. section 360bbb-3(b)(1), unless the authorization is terminated or revoked.  Performed at [facility], [address], [place], [place] [address]   Urine Culture     Status:  None   Collection Time: 12/29/23 10:16 PM   Specimen: Urine, Clean Catch  Result Value Ref Range Status   Specimen Description URINE, CLEAN CATCH  Final   Special Requests NONE  Final   Culture   Final    NO GROWTH Performed at [facility], [address] [place], [place] [address]    Report Status 12/30/2023 FINAL  Final  MRSA Next Gen by PCR, Nasal     Status: None   Collection Time: 12/30/23  2:00 AM   Specimen: Nasal Mucosa; Nasal Swab  Result Value Ref Range Status   MRSA by PCR Next Gen NOT DETECTED NOT DETECTED Final    Comment: (NOTE) The GeneXpert MRSA Assay (FDA approved for NASAL specimens only), is one component of a comprehensive MRSA colonization surveillance program. It is not intended to diagnose MRSA infection nor to guide or monitor treatment for MRSA infections. Test performance is not FDA approved in patients less than 13 years old. Performed at [facility], [address], [place], [place] [address]   Gram stain     Status: None   Collection Time: 12/30/23 11:42 AM   Specimen: Abdomen; Peritoneal Fluid  Result Value Ref Range Status   Specimen Description ABDOMEN  Final   Special Requests PERITONEAL  Final   Gram Stain   Final    NO WBC SEEN NO ORGANISMS SEEN Performed at [facility], [address] [place], [place] [address]    Report Status 12/30/2023 FINAL   Final    RADIOLOGY STUDIES/RESULTS: ECHOCARDIOGRAM COMPLETE Result Date: 12/30/2023    ECHOCARDIOGRAM REPORT   Patient Name:   AUSHA SIEH Armwood Date of Exam: 12/30/2023 Medical Rec #:  [id number]      Height:       61.0 in Accession #:    [id number]     Weight:       140.0 lb Date of Birth:  26-Mar-1945      BSA:          1.623 m Patient Age:    58 years       BP:           112/61 mmHg Patient Gender: F              HR:           82 bpm. Exam Location:  Inpatient Procedure: 2D Echo (Both Spectral and Color Flow Doppler were utilized during            procedure). Indications:    CHF  History:        Patient has prior history of Echocardiogram examinations. CHF;                 Aortic Valve Disease and Mitral Valve Disease.                 Aortic Valve: 23 mm Sapien prosthetic, stented (TAVR) valve is                 present in the aortic position.                 Mitral Valve: 25 mm St. Jude mechanical valve valve is present                 in the mitral position.  Sonographer:    Norleen Amour Referring Phys: [id number] ANASTASSIA DOUTOVA IMPRESSIONS  1. Left ventricular ejection fraction, by estimation, is 55 to 60%.  The left ventricle has normal function. The left ventricle has no regional wall motion abnormalities. There is mild left ventricular hypertrophy. Left ventricular diastolic function could not be evaluated.  2. Right ventricular systolic function is moderately reduced. The right ventricular size is severely enlarged. There is severely elevated pulmonary artery systolic pressure. The estimated right ventricular systolic pressure is 76.8 mmHg.  3. Left atrial size was severely dilated.  4. Right atrial size was severely dilated.  5. The mitral valve has been repaired/replaced. No evidence of mitral valve regurgitation. The mean mitral valve gradient is 7.7 mmHg. There is a 25 mm St. Jude mechanical valve present in the mitral position.  6. Tricuspid valve regurgitation is severe.  7. The aortic valve has been  repaired/replaced. There is moderate calcification of the aortic valve. There is moderate thickening of the aortic valve. Aortic valve regurgitation is mild. Moderate to severe aortic valve stenosis. There is a 23 mm Sapien prosthetic (TAVR) valve present in the aortic position.  8. The inferior vena cava is dilated in size with <50% respiratory variability, suggesting right atrial pressure of 15 mmHg. Comparison(s): Changes from prior study are noted. Aortic valve gradient has ranged from 15-20 mmHg since at least 2019 based on reports, with increase to 23.5 mmHg last year and now 33 mmHg. Visually TAVR leaflets are calcified and restricted. Stroke volume is low, which likely underestimates gradients. Suspect based on AVA and DI that TAVR has moderate to severe stenosis. FINDINGS  Left Ventricle: Left ventricular ejection fraction, by estimation, is 55 to 60%. The left ventricle has normal function. The left ventricle has no regional wall motion abnormalities. The left ventricular internal cavity size was normal in size. There is  mild left ventricular hypertrophy. Abnormal (paradoxical) septal motion consistent with post-operative status. Left ventricular diastolic function could not be evaluated due to mitral valve replacement. Left ventricular diastolic function could not be evaluated. Right Ventricle: The right ventricular size is severely enlarged. Right vetricular wall thickness was not well visualized. Right ventricular systolic function is moderately reduced. There is severely elevated pulmonary artery systolic pressure. The tricuspid regurgitant velocity is 3.93 m/s, and with an assumed right atrial pressure of 15 mmHg, the estimated right ventricular systolic pressure is 76.8 mmHg. Left Atrium: Left atrial size was severely dilated. Right Atrium: Right atrial size was severely dilated. Pericardium: There is no evidence of pericardial effusion. Mitral Valve: The mitral valve has been repaired/replaced. No  evidence of mitral valve regurgitation. There is a 25 mm St. Jude mechanical valve present in the mitral position. MV peak gradient, 17.6 mmHg. The mean mitral valve gradient is 7.7 mmHg. Tricuspid Valve: The tricuspid valve is normal in structure. Tricuspid valve regurgitation is severe. No evidence of tricuspid stenosis. Aortic Valve: Thickened/calcified leaflets of TAVR valve with restricted motion. The aortic valve has been repaired/replaced. There is moderate calcification of the aortic valve. There is moderate thickening of the aortic valve. Aortic valve regurgitation is mild. Aortic regurgitation PHT measures 550 msec. Moderate to severe aortic stenosis is present. Aortic valve mean gradient measures 33.0 mmHg. Aortic valve peak gradient measures 45.9 mmHg. Aortic valve area, by VTI measures 0.72 cm. There is a 23 mm Sapien prosthetic, stented (TAVR) valve present in the aortic position. Pulmonic Valve: The pulmonic valve was grossly normal. Pulmonic valve regurgitation is mild. No evidence of pulmonic stenosis. Aorta: The aortic root, ascending aorta, aortic arch and descending aorta are all structurally normal, with no evidence of dilitation or obstruction. Venous: The  inferior vena cava is dilated in size with less than 50% respiratory variability, suggesting right atrial pressure of 15 mmHg. IAS/Shunts: The atrial septum is grossly normal.  LEFT VENTRICLE PLAX 2D LVIDd:         4.50 cm LVIDs:         3.00 cm LV PW:         1.10 cm LV IVS:        1.20 cm LVOT diam:     2.10 cm LV SV:         58 LV SV Index:   36 LVOT Area:     3.46 cm  LV Volumes (MOD) LV vol d, MOD A2C: 166.0 ml LV vol d, MOD A4C: 101.0 ml LV vol s, MOD A2C: 62.3 ml LV vol s, MOD A4C: 46.4 ml LV SV MOD A2C:     103.7 ml LV SV MOD A4C:     101.0 ml LV SV MOD BP:      87.5 ml RIGHT VENTRICLE            IVC RV Basal diam:  5.00 cm    IVC diam: 2.20 cm RV S prime:     8.05 cm/s TAPSE (M-mode): 1.5 cm LEFT ATRIUM             Index         RIGHT ATRIUM           Index LA diam:        5.90 cm 3.64 cm/m   RA Area:     27.80 cm LA Vol (A2C):   91.0 ml 56.07 ml/m  RA Volume:   101.00 ml 62.23 ml/m LA Vol (A4C):   57.1 ml 35.18 ml/m LA Biplane Vol: 76.3 ml 47.01 ml/m  AORTIC VALVE                     PULMONIC VALVE AV Area (Vmax):    0.68 cm      PV Vmax:       1.40 m/s AV Area (Vmean):   0.68 cm      PV Peak grad:  7.8 mmHg AV Area (VTI):     0.72 cm AV Vmax:           338.80 cm/s AV Vmean:          235.600 cm/s AV VTI:            0.804 m AV Peak Grad:      45.9 mmHg AV Mean Grad:      33.0 mmHg LVOT Vmax:         66.37 cm/s LVOT Vmean:        46.500 cm/s LVOT VTI:          0.168 m LVOT/AV VTI ratio: 0.21 AI PHT:            550 msec  AORTA Ao Root diam: 2.70 cm Ao Asc diam:  2.80 cm MITRAL VALVE               TRICUSPID VALVE MV Area (PHT): 2.27 cm    TR Peak grad:   61.8 mmHg MV Area VTI:   1.85 cm    TR Vmax:        393.00 cm/s MV Peak grad:  17.6 mmHg MV Mean grad:  7.7 mmHg    SHUNTS MV Vmax:       2.10 m/s    Systemic VTI:  0.17 m MV  Vmean:      122.4 cm/s  Systemic Diam: 2.10 cm Shelda Bruckner MD Electronically signed by Shelda Bruckner MD Signature Date/Time: 12/30/2023/1:41:41 PM    Final    IR Paracentesis Result Date: 12/30/2023 INDICATION: Hx of NASH/MASH cirrhosis with ascites. Consult for her first diagnostic and therapeutic paracentesis. EXAM: ULTRASOUND GUIDED DIAGNOSTIC AND THERAPEUTIC PARACENTESIS MEDICATIONS: 7 mL 1% lidocaine COMPLICATIONS: None immediate. PROCEDURE: Informed written consent was obtained from the patient after a discussion of the risks, benefits and alternatives to treatment. A timeout was performed prior to the initiation of the procedure. Initial ultrasound scanning demonstrates a large amount of ascites within the right lower abdominal quadrant. The right lower abdomen was prepped and draped in the usual sterile fashion. 1% lidocaine was used for local anesthesia. Following this, a 19  gauge, 7-cm, Yueh catheter was introduced. An ultrasound image was saved for documentation purposes. The paracentesis was performed. The catheter was removed and a dressing was applied. The patient tolerated the procedure well without immediate post procedural complication. Patient received post-procedure intravenous albumin; see nursing notes for details. FINDINGS: A total of approximately 10 liters of hazy dark yellow fluid was removed. Samples were sent to the laboratory as requested by the clinical team. IMPRESSION: Successful ultrasound-guided paracentesis yielding 10 liters of peritoneal fluid. Performed by: Wyatt Pommier, PA-C Electronically Signed   By: Cordella Banner   On: 12/30/2023 12:35     LOS: 2 days   Donalda Applebaum, MD  [facility]    To contact the attending provider between 7A-7P or the covering provider during after hours 7P-7A, please log into the web site [email or web address] and access using universal [place] password for that web site. If you do not have the password, please call the hospital operator.  01/01/2024, 9:42 AM

## 2024-01-01 NOTE — Progress Notes (Signed)
 PHARMACY - ANTICOAGULATION CONSULT NOTE  Pharmacy Consult for Heparin Indication: mechanical mitral valve and subtherapeutic INR on warfarin  No Known Allergies  Patient Measurements: Height: 5' 1 (154.9 cm) Weight: 61.6 kg (135 lb 12.9 oz) IBW/kg (Calculated) : 47.8 HEPARIN DW (KG): 60.9  Vital Signs: Temp: 97.5 F (36.4 C) (10/12 1920) Temp Source: Oral (10/12 1920) BP: 122/53 (10/12 1920) Pulse Rate: 71 (10/12 1920)  Labs: Recent Labs    12/30/23 0345 12/30/23 0639 12/30/23 1553 12/31/23 0525 12/31/23 1457 12/31/23 2337 01/01/24 0527 01/01/24 1101 01/01/24 1951  HGB 10.2*  --   --  10.4*  --   --  10.5*  --   --   HCT 30.8*  --   --  31.5*  --   --  31.3*  --   --   PLT 165  --   --  165  --   --  147*  --   --   LABPROT 20.2*  --   --  17.0*  --   --  23.5*  --   --   INR 1.6*  --   --  1.3*  --   --  2.0*  --   --   HEPARINUNFRC  --  0.28*   < > >1.10*   < > 0.28*  --  0.31 0.43  CREATININE  --  1.38*  --  1.47*  --   --  1.39*  --   --    < > = values in this interval not displayed.   Estimated Creatinine Clearance: 27.6 mL/min (A) (by C-G formula based on SCr of 1.39 mg/dL (H)).  Infusions:   heparin 950 Units/hr (01/01/24 0903)    Assessment: Patient is a 78 YO F presenting with abdominal distension, peripheral edema, and fatigue. 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 According to last anticoag clinic note on 9/25, warfarin regimen was one-half 5 mg tablet daily and INR was 3 at that time. Warfarin on hold for possible R/L cardiac cath on Monday 10/13.   Heparin level therapeutic (0.43) on infusion at 950 units/hr. No bleeding noted.   Goal of Therapy:  Heparin level 0.3-0.7 units/ml Monitor platelets by anticoagulation protocol: Yes   Plan:  Continue heparin infusion at 950 units/hr.  Warfarin on hold Monitor heparin levels, CBC, INR daily.   Thank you for allowing pharmacy to be  part of this patients care team.  Vito Ralph, PharmD, BCPS Please see amion for complete clinical pharmacist phone list 01/01/2024 8:16 PM

## 2024-01-01 NOTE — Plan of Care (Signed)
   Problem: Education: Goal: Knowledge of General Education information will improve Description Including pain rating scale, medication(s)/side effects and non-pharmacologic comfort measures Outcome: Progressing   Problem: Health Behavior/Discharge Planning: Goal: Ability to manage health-related needs will improve Outcome: Progressing

## 2024-01-01 NOTE — Progress Notes (Signed)
 PHARMACY - ANTICOAGULATION CONSULT NOTE  Pharmacy Consult for Heparin Indication: mechanical mitral valve and subtherapeutic INR on warfarin  No Known Allergies  Patient Measurements: Height: 5' 1 (154.9 cm) Weight: 62.9 kg (138 lb 11.2 oz) IBW/kg (Calculated) : 47.8 HEPARIN DW (KG): 60.9  Vital Signs: Temp: 97.9 F (36.6 C) (10/12 0400) Temp Source: Oral (10/12 0400) BP: 92/48 (10/12 0400) Pulse Rate: 68 (10/12 0400)  Labs: Recent Labs    12/29/23 1537 12/29/23 1550 12/29/23 1828 12/29/23 2005 12/30/23 0345 12/30/23 0639 12/30/23 1553 12/31/23 0525 12/31/23 1457 12/31/23 2337 01/01/24 0527  HGB 11.0*   < >  --   --  10.2*  --   --  10.4*  --   --  10.5*  HCT 33.5*   < >  --   --  30.8*  --   --  31.5*  --   --  31.3*  PLT 174  --   --   --  165  --   --  165  --   --  147*  LABPROT 19.0*  --   --   --  20.2*  --   --  17.0*  --   --  23.5*  INR 1.5*  --   --   --  1.6*  --   --  1.3*  --   --  2.0*  HEPARINUNFRC  --   --   --   --   --  0.28*   < > >1.10* <0.10* 0.28*  --   CREATININE 1.54*  --   --    < >  --  1.38*  --  1.47*  --   --  1.39*  TROPONINIHS 12  --  12  --   --   --   --   --   --   --   --    < > = values in this interval not displayed.   Estimated Creatinine Clearance: 27.9 mL/min (A) (by C-G formula based on SCr of 1.39 mg/dL (H)).  Medical History: Past Medical History:  Diagnosis Date   Atrial fibrillation (HCC)    Heart disease    aortic and mitral valve problems   Hepatic cirrhosis (HCC)    Hypothyroidism     Medications:  Scheduled:   fluticasone  1 spray Each Nare Daily   furosemide  80 mg Intravenous BID   lactulose  20 g Oral BID   levothyroxine  50 mcg Intravenous Daily   metoprolol  succinate  25 mg Oral Daily   midodrine  5 mg Oral TID WC   ondansetron (ZOFRAN) IV  4 mg Intravenous Once   rifaximin  550 mg Oral BID   sodium chloride flush  3 mL Intravenous Q12H   spironolactone   25 mg Oral Daily  Infusions:   heparin  950 Units/hr (01/01/24 0138)    Assessment: Patient is a 78 YO F presenting with abdominal distension, peripheral edema, and fatigue. 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 According to last anticoag clinic note on 9/25, warfarin regimen was one-half 5 mg tablet daily and INR was 3 at that time. Warfarin on hold for possible R/L cardiac cath on Monday 10/13.   Heparin level therapeutic at 0.31 on 950 units/hr. Per RN, no significant bleeding - reports bruising as irritated by lab draws/tubing. Spoke with patient at bedside, confirmed labs were drawn from opposite arm from infusion.  Goal of Therapy:  Heparin level 0.3-0.7 units/ml Monitor platelets by anticoagulation protocol: Yes   Plan:  Continue heparin infusion at 950 units/hr.  Check confirmatory heparin level in 8 hours Warfarin on hold Monitor heparin levels, CBC, INR daily.   Thank you for allowing pharmacy to be part of this patients care team.  Dionicia Canavan, PharmD, RPh PGY1 Acute Care Pharmacy Resident [facility]  01/01/2024 12:05 PM

## 2024-01-01 NOTE — Progress Notes (Signed)
 Progress Note  Patient Name: Chelsea Boyer Date of Encounter: 01/01/2024  Primary Cardiologist:   Jerel Balding, MD   Subjective   She feels OK.  No acute SOB or pain.   Inpatient Medications    Scheduled Meds:  fluticasone  1 spray Each Nare Daily   furosemide  80 mg Intravenous BID   lactulose  20 g Oral BID   levothyroxine  50 mcg Intravenous Daily   metoprolol  succinate  25 mg Oral Daily   midodrine  5 mg Oral TID WC   ondansetron (ZOFRAN) IV  4 mg Intravenous Once   rifaximin  550 mg Oral BID   sodium chloride flush  3 mL Intravenous Q12H   spironolactone   25 mg Oral Daily   Continuous Infusions:  heparin 950 Units/hr (01/01/24 0138)   PRN Meds: HYDROcodone-acetaminophen, ondansetron **OR** ondansetron (ZOFRAN) IV, sodium chloride flush   Vital Signs    Vitals:   12/31/23 1721 12/31/23 2000 01/01/24 0003 01/01/24 0400  BP: 122/65 (!) 115/45 (!) 113/53 (!) 92/48  Pulse:  63 76 68  Resp: 17 17 16 15   Temp:  97.6 F (36.4 C) 97.9 F (36.6 C) 97.9 F (36.6 C)  TempSrc:  Oral Oral Oral  SpO2:      Weight:      Height:        Intake/Output Summary (Last 24 hours) at 01/01/2024 0840 Last data filed at 01/01/2024 0300 Gross per 24 hour  Intake 65.89 ml  Output 1350 ml  Net -1284.11 ml   Filed Weights   12/29/23 1452 12/31/23 1343  Weight: 63.5 kg 62.9 kg    Telemetry    Atrial fib with controlled rate - Personally Reviewed  ECG    NA - Personally Reviewed  Physical Exam   GEN: No  acute distress.   Neck:  Positive JVD Cardiac: RRR,  3/6 systolic murmur, no diastolicmurmurs, rubs, or gallops.  Respiratory: Clear  to auscultation bilaterally. GI: Soft, nontender, distended, normal bowel sounds  MS:  Moderate edema; No deformity. Neuro:   Nonfocal  Psych: Oriented and appropriate    Labs    Chemistry Recent Labs  Lab 12/30/23 0639 12/31/23 0525 01/01/24 0527  NA 125* 127* 126*  K 4.7 5.0 4.2  CL 93* 93* 95*  CO2 20* 21* 22   GLUCOSE 65* 96 109*  BUN 28* 30* 29*  CREATININE 1.38* 1.47* 1.39*  CALCIUM 8.5* 8.9 8.5*  PROT 7.4 7.2 6.7  ALBUMIN 2.6* 3.2* 2.8*  AST 29 25 27   ALT 9 8 8   ALKPHOS 60 52 51  BILITOT 2.1* 2.6* 2.5*  GFRNONAA 39* 36* 39*  ANIONGAP 12 13 9      Hematology Recent Labs  Lab 12/30/23 0345 12/31/23 0525 01/01/24 0527  WBC 4.5 5.5 7.6  RBC 3.80* 3.89 3.92  HGB 10.2* 10.4* 10.5*  HCT 30.8* 31.5* 31.3*  MCV 81.1 81.0 79.8*  MCH 26.8 26.7 26.8  MCHC 33.1 33.0 33.5  RDW 18.4* 18.5* 18.3*  PLT 165 165 147*    Cardiac EnzymesNo results for input(s): TROPONINI in the last 168 hours. No results for input(s): TROPIPOC in the last 168 hours.   BNP Recent Labs  Lab 12/29/23 1537  BNP 639.2*     DDimer No results for input(s): DDIMER in the last 168 hours.   Radiology    ECHOCARDIOGRAM COMPLETE Result Date: 12/30/2023    ECHOCARDIOGRAM REPORT   Patient Name:   Chelsea Boyer Date of Exam:  12/30/2023 Medical Rec #:  [id number]      Height:       61.0 in Accession #:    [id number]     Weight:       140.0 lb Date of Birth:  [birth year]      BSA:          1.623 m Patient Age:    78 years       BP:           112/61 mmHg Patient Gender: F              HR:           82 bpm. Exam Location:  Inpatient Procedure: 2D Echo (Both Spectral and Color Flow Doppler were utilized during            procedure). Indications:    CHF  History:        Patient has prior history of Echocardiogram examinations. CHF;                 Aortic Valve Disease and Mitral Valve Disease.                 Aortic Valve: 23 mm Sapien prosthetic, stented (TAVR) valve is                 present in the aortic position.                 Mitral Valve: 25 mm St. Jude mechanical valve valve is present                 in the mitral position.  Sonographer:    Norleen Amour Referring Phys: [id number] ANASTASSIA DOUTOVA IMPRESSIONS  1. Left ventricular ejection fraction, by estimation, is 55 to 60%. The left ventricle has normal function.  The left ventricle has no regional wall motion abnormalities. There is mild left ventricular hypertrophy. Left ventricular diastolic function could not be evaluated.  2. Right ventricular systolic function is moderately reduced. The right ventricular size is severely enlarged. There is severely elevated pulmonary artery systolic pressure. The estimated right ventricular systolic pressure is 76.8 mmHg.  3. Left atrial size was severely dilated.  4. Right atrial size was severely dilated.  5. The mitral valve has been repaired/replaced. No evidence of mitral valve regurgitation. The mean mitral valve gradient is 7.7 mmHg. There is a 25 mm St. Jude mechanical valve present in the mitral position.  6. Tricuspid valve regurgitation is severe.  7. The aortic valve has been repaired/replaced. There is moderate calcification of the aortic valve. There is moderate thickening of the aortic valve. Aortic valve regurgitation is mild. Moderate to severe aortic valve stenosis. There is a 23 mm Sapien prosthetic (TAVR) valve present in the aortic position.  8. The inferior vena cava is dilated in size with <50% respiratory variability, suggesting right atrial pressure of 15 mmHg. Comparison(s): Changes from prior study are noted. Aortic valve gradient has ranged from 15-20 mmHg since at least 2019 based on reports, with increase to 23.5 mmHg last year and now 33 mmHg. Visually TAVR leaflets are calcified and restricted. Stroke volume is low, which likely underestimates gradients. Suspect based on AVA and DI that TAVR has moderate to severe stenosis. FINDINGS  Left Ventricle: Left ventricular ejection fraction, by estimation, is 55 to 60%. The left ventricle has normal function. The left ventricle has no regional wall motion abnormalities. The left ventricular internal cavity size was normal in size.  There is  mild left ventricular hypertrophy. Abnormal (paradoxical) septal motion consistent with post-operative status. Left  ventricular diastolic function could not be evaluated due to mitral valve replacement. Left ventricular diastolic function could not be evaluated. Right Ventricle: The right ventricular size is severely enlarged. Right vetricular wall thickness was not well visualized. Right ventricular systolic function is moderately reduced. There is severely elevated pulmonary artery systolic pressure. The tricuspid regurgitant velocity is 3.93 m/s, and with an assumed right atrial pressure of 15 mmHg, the estimated right ventricular systolic pressure is 76.8 mmHg. Left Atrium: Left atrial size was severely dilated. Right Atrium: Right atrial size was severely dilated. Pericardium: There is no evidence of pericardial effusion. Mitral Valve: The mitral valve has been repaired/replaced. No evidence of mitral valve regurgitation. There is a 25 mm St. Jude mechanical valve present in the mitral position. MV peak gradient, 17.6 mmHg. The mean mitral valve gradient is 7.7 mmHg. Tricuspid Valve: The tricuspid valve is normal in structure. Tricuspid valve regurgitation is severe. No evidence of tricuspid stenosis. Aortic Valve: Thickened/calcified leaflets of TAVR valve with restricted motion. The aortic valve has been repaired/replaced. There is moderate calcification of the aortic valve. There is moderate thickening of the aortic valve. Aortic valve regurgitation is mild. Aortic regurgitation PHT measures 550 msec. Moderate to severe aortic stenosis is present. Aortic valve mean gradient measures 33.0 mmHg. Aortic valve peak gradient measures 45.9 mmHg. Aortic valve area, by VTI measures 0.72 cm. There is a 23 mm Sapien prosthetic, stented (TAVR) valve present in the aortic position. Pulmonic Valve: The pulmonic valve was grossly normal. Pulmonic valve regurgitation is mild. No evidence of pulmonic stenosis. Aorta: The aortic root, ascending aorta, aortic arch and descending aorta are all structurally normal, with no evidence of  dilitation or obstruction. Venous: The inferior vena cava is dilated in size with less than 50% respiratory variability, suggesting right atrial pressure of 15 mmHg. IAS/Shunts: The atrial septum is grossly normal.  LEFT VENTRICLE PLAX 2D LVIDd:         4.50 cm LVIDs:         3.00 cm LV PW:         1.10 cm LV IVS:        1.20 cm LVOT diam:     2.10 cm LV SV:         58 LV SV Index:   36 LVOT Area:     3.46 cm  LV Volumes (MOD) LV vol d, MOD A2C: 166.0 ml LV vol d, MOD A4C: 101.0 ml LV vol s, MOD A2C: 62.3 ml LV vol s, MOD A4C: 46.4 ml LV SV MOD A2C:     103.7 ml LV SV MOD A4C:     101.0 ml LV SV MOD BP:      87.5 ml RIGHT VENTRICLE            IVC RV Basal diam:  5.00 cm    IVC diam: 2.20 cm RV S prime:     8.05 cm/s TAPSE (M-mode): 1.5 cm LEFT ATRIUM             Index        RIGHT ATRIUM           Index LA diam:        5.90 cm 3.64 cm/m   RA Area:     27.80 cm LA Vol (A2C):   91.0 ml 56.07 ml/m  RA Volume:   101.00 ml 62.23 ml/m LA Vol (  A4C):   57.1 ml 35.18 ml/m LA Biplane Vol: 76.3 ml 47.01 ml/m  AORTIC VALVE                     PULMONIC VALVE AV Area (Vmax):    0.68 cm      PV Vmax:       1.40 m/s AV Area (Vmean):   0.68 cm      PV Peak grad:  7.8 mmHg AV Area (VTI):     0.72 cm AV Vmax:           338.80 cm/s AV Vmean:          235.600 cm/s AV VTI:            0.804 m AV Peak Grad:      45.9 mmHg AV Mean Grad:      33.0 mmHg LVOT Vmax:         66.37 cm/s LVOT Vmean:        46.500 cm/s LVOT VTI:          0.168 m LVOT/AV VTI ratio: 0.21 AI PHT:            550 msec  AORTA Ao Root diam: 2.70 cm Ao Asc diam:  2.80 cm MITRAL VALVE               TRICUSPID VALVE MV Area (PHT): 2.27 cm    TR Peak grad:   61.8 mmHg MV Area VTI:   1.85 cm    TR Vmax:        393.00 cm/s MV Peak grad:  17.6 mmHg MV Mean grad:  7.7 mmHg    SHUNTS MV Vmax:       2.10 m/s    Systemic VTI:  0.17 m MV Vmean:      122.4 cm/s  Systemic Diam: 2.10 cm Shelda Bruckner MD Electronically signed by Shelda Bruckner MD Signature  Date/Time: 12/30/2023/1:41:41 PM    Final    IR Paracentesis Result Date: 12/30/2023 INDICATION: Hx of NASH/MASH cirrhosis with ascites. Consult for her first diagnostic and therapeutic paracentesis. EXAM: ULTRASOUND GUIDED DIAGNOSTIC AND THERAPEUTIC PARACENTESIS MEDICATIONS: 7 mL 1% lidocaine COMPLICATIONS: None immediate. PROCEDURE: Informed written consent was obtained from the patient after a discussion of the risks, benefits and alternatives to treatment. A timeout was performed prior to the initiation of the procedure. Initial ultrasound scanning demonstrates a large amount of ascites within the right lower abdominal quadrant. The right lower abdomen was prepped and draped in the usual sterile fashion. 1% lidocaine was used for local anesthesia. Following this, a 19 gauge, 7-cm, Yueh catheter was introduced. An ultrasound image was saved for documentation purposes. The paracentesis was performed. The catheter was removed and a dressing was applied. The patient tolerated the procedure well without immediate post procedural complication. Patient received post-procedure intravenous albumin; see nursing notes for details. FINDINGS: A total of approximately 10 liters of hazy dark yellow fluid was removed. Samples were sent to the laboratory as requested by the clinical team. IMPRESSION: Successful ultrasound-guided paracentesis yielding 10 liters of peritoneal fluid. Performed by: Kimble Clas, PA-C Electronically Signed   By: Cordella Banner   On: 12/30/2023 12:35    Cardiac Studies   Echo above  Patient Profile     78 y.o. female with a history of right heart failure,  permanent atrial fibrillation on Coumadin , suspected rheumatic heart disease s/p multiple valvular surgeries (mitral valve replacement with a biological prosthesis in 1986 subsequent replacement  of the biological prosthesis with a St Jude mechanical valve in 1996 and then TAVR 10/2016), severe tricuspid regurgitation, pulmonary  hypertension, systemic hypertension, hepatic cirrhosis, CKD stage IIb, hypothyroidism, and anemia who is being seen 12/30/2023 for the evaluation of CHF at the request of Dr. Fairy.    Assessment & Plan    Acute on chronic diastolic HF:   Intake and output with 1284 negative yesterday.   Continue IV diuresis.    Na and creat seem to tolerate current diuretic.    Mechanical mitral valve:  Stable valve.  Continue heparin.   CKD IIIB:  Creat at baseline.  Creat is relatively stable this AM.     TAVR:   Keep NPO in the AM.  Will discuss with Dr. Francyne.  Tentatively planned for right and left heart cath tomorrow.    Atrial fib:  Rate is controlled.   Continue IV heparin until we decide on cath or not.    For questions or updates, please contact [facility] Please consult [email or web address] for contact info under Cardiology/STEMI.   Signed, Lynwood Schilling, MD  01/01/2024, 8:40 AM

## 2024-01-02 ENCOUNTER — Encounter (HOSPITAL_COMMUNITY)
Admission: EM | Disposition: A | Discharge status: Skilled Nursing Facility | Payer: Self-pay | Source: Home / Self Care | Attending: Student

## 2024-01-02 ENCOUNTER — Other Ambulatory Visit: Payer: Self-pay | Admitting: Cardiovascular Disease

## 2024-01-02 DIAGNOSIS — I5033 Acute on chronic diastolic (congestive) heart failure: Secondary | ICD-10-CM | POA: Diagnosis not present

## 2024-01-02 DIAGNOSIS — R188 Other ascites: Secondary | ICD-10-CM | POA: Diagnosis not present

## 2024-01-02 DIAGNOSIS — T82857A Stenosis of cardiac prosthetic devices, implants and grafts, initial encounter: Secondary | ICD-10-CM

## 2024-01-02 DIAGNOSIS — K7460 Unspecified cirrhosis of liver: Secondary | ICD-10-CM | POA: Diagnosis not present

## 2024-01-02 DIAGNOSIS — I4821 Permanent atrial fibrillation: Secondary | ICD-10-CM | POA: Diagnosis not present

## 2024-01-02 DIAGNOSIS — K7290 Hepatic failure, unspecified without coma: Secondary | ICD-10-CM | POA: Diagnosis not present

## 2024-01-02 HISTORY — PX: RIGHT HEART CATH: CATH118263

## 2024-01-02 LAB — CBC
HCT: 30.5 % — ABNORMAL LOW (ref 36.0–46.0)
Hemoglobin: 10.3 g/dL — ABNORMAL LOW (ref 12.0–15.0)
MCH: 26.8 pg (ref 26.0–34.0)
MCHC: 33.8 g/dL (ref 30.0–36.0)
MCV: 79.4 fL — ABNORMAL LOW (ref 80.0–100.0)
Platelets: 124 K/uL — ABNORMAL LOW (ref 150–400)
RBC: 3.84 MIL/uL — ABNORMAL LOW (ref 3.87–5.11)
RDW: 18.2 % — ABNORMAL HIGH (ref 11.5–15.5)
WBC: 8.7 K/uL (ref 4.0–10.5)
nRBC: 0 % (ref 0.0–0.2)

## 2024-01-02 LAB — PROTIME-INR
INR: 1.9 — ABNORMAL HIGH (ref 0.8–1.2)
Prothrombin Time: 23 s — ABNORMAL HIGH (ref 11.4–15.2)

## 2024-01-02 LAB — BASIC METABOLIC PANEL WITH GFR
Anion gap: 12 (ref 5–15)
BUN: 30 mg/dL — ABNORMAL HIGH (ref 8–23)
CO2: 21 mmol/L — ABNORMAL LOW (ref 22–32)
Calcium: 8.4 mg/dL — ABNORMAL LOW (ref 8.9–10.3)
Chloride: 94 mmol/L — ABNORMAL LOW (ref 98–111)
Creatinine, Ser: 1.43 mg/dL — ABNORMAL HIGH (ref 0.44–1.00)
GFR, Estimated: 37 mL/min — ABNORMAL LOW (ref >60)
Glucose, Bld: 95 mg/dL (ref 70–99)
Potassium: 3.9 mmol/L (ref 3.5–5.1)
Sodium: 127 mmol/L — ABNORMAL LOW (ref 135–145)

## 2024-01-02 LAB — MAGNESIUM: Magnesium: 1.6 mg/dL — ABNORMAL LOW (ref 1.7–2.4)

## 2024-01-02 LAB — AFP TUMOR MARKER: AFP, Serum, Tumor Marker: <1.8 ng/mL (ref 0.0–9.2)

## 2024-01-02 LAB — HEPARIN LEVEL (UNFRACTIONATED): Heparin Unfractionated: 0.37 [IU]/mL (ref 0.30–0.70)

## 2024-01-02 LAB — PATHOLOGIST SMEAR REVIEW

## 2024-01-02 SURGERY — RIGHT HEART CATH
Anesthesia: LOCAL

## 2024-01-02 MED ORDER — LIDOCAINE HCL (PF) 1 % IJ SOLN
INTRAMUSCULAR | Status: AC
  Filled 2024-01-02: qty 30

## 2024-01-02 MED ORDER — FENTANYL CITRATE (PF) 100 MCG/2ML IJ SOLN
INTRAMUSCULAR | Status: DC | PRN
  Administered 2024-01-02: 25 ug via INTRAVENOUS

## 2024-01-02 MED ORDER — ASPIRIN 81 MG PO CHEW
81.0000 mg | CHEWABLE_TABLET | Freq: Once | ORAL | Status: AC
  Administered 2024-01-02: 81 mg via ORAL
  Filled 2024-01-02: qty 1

## 2024-01-02 MED ORDER — LIDOCAINE HCL (PF) 1 % IJ SOLN
INTRAMUSCULAR | Status: DC | PRN
  Administered 2024-01-02 (×2): 2 mL via INTRADERMAL

## 2024-01-02 MED ORDER — HEPARIN SODIUM (PORCINE) 1000 UNIT/ML IJ SOLN
INTRAMUSCULAR | Status: AC
  Filled 2024-01-02: qty 10

## 2024-01-02 MED ORDER — MIDAZOLAM HCL 2 MG/2ML IJ SOLN
INTRAMUSCULAR | Status: AC
  Filled 2024-01-02: qty 2

## 2024-01-02 MED ORDER — FENTANYL CITRATE (PF) 100 MCG/2ML IJ SOLN
INTRAMUSCULAR | Status: AC
  Filled 2024-01-02: qty 2

## 2024-01-02 MED ORDER — VERAPAMIL HCL 2.5 MG/ML IV SOLN
INTRAVENOUS | Status: AC
  Filled 2024-01-02: qty 2

## 2024-01-02 MED ORDER — HEPARIN SODIUM (PORCINE) 1000 UNIT/ML IJ SOLN
INTRAMUSCULAR | Status: DC | PRN
  Administered 2024-01-02: 3000 [IU] via INTRAVENOUS

## 2024-01-02 MED ORDER — MIDAZOLAM HCL 2 MG/2ML IJ SOLN
INTRAMUSCULAR | Status: DC | PRN
  Administered 2024-01-02: 1 mg via INTRAVENOUS

## 2024-01-02 MED ORDER — VERAPAMIL HCL 2.5 MG/ML IV SOLN
INTRAVENOUS | Status: DC | PRN
  Administered 2024-01-02: 10 mL via INTRA_ARTERIAL

## 2024-01-02 MED ORDER — WARFARIN SODIUM 2.5 MG PO TABS
2.5000 mg | ORAL_TABLET | Freq: Once | ORAL | Status: AC
  Administered 2024-01-03: 2.5 mg via ORAL
  Filled 2024-01-02: qty 1

## 2024-01-02 MED ORDER — HEPARIN (PORCINE) 25000 UT/250ML-% IV SOLN
1200.0000 [IU]/h | INTRAVENOUS | Status: DC
  Administered 2024-01-03: 950 [IU]/h via INTRAVENOUS
  Administered 2024-01-04 – 2024-01-05 (×2): 1200 [IU]/h via INTRAVENOUS
  Filled 2024-01-02 (×3): qty 250

## 2024-01-02 MED ORDER — MAGNESIUM SULFATE 4 GM/100ML IV SOLN
4.0000 g | Freq: Once | INTRAVENOUS | Status: AC
  Administered 2024-01-02: 4 g via INTRAVENOUS
  Filled 2024-01-02: qty 100

## 2024-01-02 MED ORDER — WARFARIN - PHARMACIST DOSING INPATIENT
Freq: Every day | Status: DC
Start: 2024-01-03 — End: 2024-01-16

## 2024-01-02 MED ORDER — HEPARIN (PORCINE) IN NACL 1000-0.9 UT/500ML-% IV SOLN
INTRAVENOUS | Status: DC | PRN
  Administered 2024-01-02 (×2): 500 mL

## 2024-01-02 MED ORDER — SODIUM CHLORIDE 0.9 % IV SOLN: INTRAVENOUS | Status: DC

## 2024-01-02 SURGICAL SUPPLY — 10 items
CATH 5FR JL3.5 JR4 ANG PIG MP (CATHETERS) IMPLANT
CATH BALLN WEDGE 5F 110CM (CATHETERS) IMPLANT
CATH INFINITI AMBI 5FR JK (CATHETERS) IMPLANT
DEVICE RAD TR BAND REGULAR (VASCULAR PRODUCTS) IMPLANT
GLIDESHEATH SLEND SS 6F .021 (SHEATH) IMPLANT
GUIDEWIRE INQWIRE 1.5J.035X260 (WIRE) IMPLANT
PACK CARDIAC CATHETERIZATION (CUSTOM PROCEDURE TRAY) ×1 IMPLANT
SET ATX-X65L (MISCELLANEOUS) IMPLANT
SHEATH GLIDE SLENDER 4/5FR (SHEATH) IMPLANT
SHEATH PROBE COVER 6X72 (BAG) IMPLANT

## 2024-01-02 NOTE — Plan of Care (Signed)
  Problem: Education: Goal: Knowledge of General Education information will improve Description: Including pain rating scale, medication(s)/side effects and non-pharmacologic comfort measures Outcome: Progressing   Problem: Clinical Measurements: Goal: Ability to maintain clinical measurements within normal limits will improve Outcome: Progressing Goal: Will remain free from infection Outcome: Progressing Goal: Diagnostic test results will improve Outcome: Progressing Goal: Respiratory complications will improve Outcome: Progressing Goal: Cardiovascular complication will be avoided Outcome: Progressing   Problem: Activity: Goal: Risk for activity intolerance will decrease Outcome: Progressing   Problem: Nutrition: Goal: Adequate nutrition will be maintained Outcome: Progressing   Problem: Coping: Goal: Level of anxiety will decrease Outcome: Progressing   Problem: Elimination: Goal: Will not experience complications related to bowel motility Outcome: Progressing Goal: Will not experience complications related to urinary retention Outcome: Progressing   Problem: Pain Managment: Goal: General experience of comfort will improve and/or be controlled Outcome: Progressing   Problem: Safety: Goal: Ability to remain free from injury will improve Outcome: Progressing   Problem: Skin Integrity: Goal: Risk for impaired skin integrity will decrease Outcome: Progressing   Problem: Activity: Goal: Capacity to carry out activities will improve Outcome: Progressing   Problem: Cardiac: Goal: Ability to achieve and maintain adequate cardiopulmonary perfusion will improve Outcome: Progressing   Problem: Clinical Measurements: Goal: Ability to avoid or minimize complications of infection will improve Outcome: Progressing   Problem: Skin Integrity: Goal: Skin integrity will improve Outcome: Progressing   Problem: Education: Goal: Understanding of CV disease, CV risk  reduction, and recovery process will improve Outcome: Progressing   Problem: Activity: Goal: Ability to return to baseline activity level will improve Outcome: Progressing   Problem: Cardiovascular: Goal: Ability to achieve and maintain adequate cardiovascular perfusion will improve Outcome: Progressing Goal: Vascular access site(s) Level 0-1 will be maintained Outcome: Progressing   Problem: Health Behavior/Discharge Planning: Goal: Ability to safely manage health-related needs after discharge will improve Outcome: Progressing

## 2024-01-02 NOTE — Progress Notes (Addendum)
 PROGRESS NOTE        PATIENT DETAILS Name: Chelsea Boyer Age: 78 y.o. Sex: female Date of Birth: 1945-08-22 Admit Date: 12/29/2023 Admitting Physician Sigurd Pac, MD ERE:[email or web address], Lamarr RAMAN, MD  Brief Summary: Patient is a 78 y.o.  female with history of Hollie liver cirrhosis, chronic HFpEF/RV failure, A-fib on Coumadin , mechanical mitral valve replacement, TAVR-who presented with anasarca-thought to be secondary to combination of decompensated liver cirrhosis and RV failure.  Significant events: 10/9>> admit to [facility]  Significant studies: 10/9>> CT head: No acute intracranial abnormality. 10/9>> CT abdomen/pelvis: Cirrhosis-large volume ascites. 10/9>> CXR: No acute abnormality. 10/10>> echo: EF 55-60%, RV systolic function moderately reduced, RVSP 76.8.  Moderate to severe aortic valve stenosis.  Significant microbiology data: 10/9>> COVID/influenza/RSV PCR: Negative 10/9>> blood culture: No growth  Procedures: 10/10>> paracentesis (WBC 105,8% neutrophils).  Consults: GI Cardiology  Subjective: No major issues overnight-somewhat mildly confused but easily reoriented.  Answers questions appropriately.  Thinks her lower extremity edema has improved.  Objective: Vitals: Blood pressure (!) 97/44, pulse 79, temperature 98 F (36.7 C), temperature source Oral, resp. rate 17, height 5' 1 (1.549 m), weight 61.6 kg, SpO2 97%.   Exam: Gen Exam:Alert awake-not in any distress HEENT:atraumatic, normocephalic Chest: B/L clear to auscultation anteriorly CVS:S1S2 regular Abdomen: Soft-slightly distended but not tense. Extremities: Trace.  Edema Neurology: Non focal Skin: no rash  Pertinent Labs/Radiology:    Latest Ref Rng & Units 01/02/2024    3:15 AM 01/01/2024    5:27 AM 12/31/2023    5:25 AM  CBC  WBC 4.0 - 10.5 K/uL 8.7  7.6  5.5   Hemoglobin 12.0 - 15.0 g/dL 89.6  89.4  89.5   Hematocrit 36.0 - 46.0 % 30.5  31.3  31.5   Platelets  150 - 400 K/uL 124  147  165     Lab Results  Component Value Date   NA 127 (L) 01/02/2024   K 3.9 01/02/2024   CL 94 (L) 01/02/2024   CO2 21 (L) 01/02/2024      Assessment/Plan: Anasarca/ascites Secondary to decompensated liver cirrhosis and HFpEF/RV failure exacerbation. Volume status gradually improving-continue continue diuretics-Lasix 80 mg twice daily/Aldactone  25 daily Daily weights/intake/output Monitor electrolytes  Acute on chronic HFpEF RV failure Pulmonary hypertension Contributing to massive anasarca Volume status gradually improving-on midodrine for BP support.   Continue diuretics-watch electrolytes/intake/output/weights. Cardiology following-LHC/RHC tentatively planned for later today.  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 Probably exacerbating RV failure Cardiology planning LHC/RHC today-for hemodynamic readings/pressure gradients Per cardiology-will not be a candidate for aggressive care including redo TAVR. May need involvement by palliative care at some point in time.  Hyponatremia Secondary to hypervolemia-from CHF/liver cirrhosis Thankfully asymptomatic. Continue diuresis Fluid restrict Follow electrolytes.  Decompensated NASH liver cirrhosis Continue diuretics S/p paracentesis 10/10-no evidence of SBP Mostly very awake/alert-but does have very mild occasional confusion-continue lactulose/rifaximin.  Chronic A-fib Rate controlled-metoprolol  Coumadin  on hold-on IV heparin currently on hold for RHC/LHC later today.  History of mechanical mitral valve replacement Coumadin  on hold-IV heparin Thankfully mechanical mitral valve appears stable on echo.  Hypothyroidism TSH elevated-19.5 on 10/9-felt to be due to poor absorption of Synthroid through edematous cut Currently on IV Synthroid-switch to oral route once volume status improves.  Normocytic anemia Likely secondary to acute illness superimposed  on underlying chronic disease Monitor  Hb  Debility/deconditioning PT/OT eval  Palliative care/goals of care Full code for now Briefly discussed with patient regarding complexity of her underlying issues-risk of recurrence-no good long-term solutions.  For now continue diuresis-await LHC/RHC-further recommendations from cardiology-but suspect may need involvement of palliative care at some point in time.   Code status:   Code Status: Full Code   DVT Prophylaxis: Place TED hose Start: 12/31/23 1303 SCDs Start: 12/30/23 0511 IV Heparin   Family Communication: Spouse at bedside   Disposition Plan: Status is: Inpatient Remains inpatient appropriate because: Severity of illness   Planned Discharge Destination:Home health   Diet: Diet Order             Diet NPO time specified Except for: Sips with Meds  Diet effective now                     Antimicrobial agents: Anti-infectives (From admission, onward)    Start     Dose/Rate Route Frequency Ordered Stop   12/30/23 1030  rifaximin (XIFAXAN) tablet 550 mg        550 mg Oral 2 times daily 12/30/23 1029     12/30/23 0200  doxycycline (VIBRA-TABS) tablet 100 mg  Status:  Discontinued        100 mg Oral Every 12 hours 12/29/23 2347 12/30/23 1244        MEDICATIONS: Scheduled Meds:  fluticasone  1 spray Each Nare Daily   furosemide  80 mg Intravenous BID   lactulose  20 g Oral BID   levothyroxine  50 mcg Intravenous Daily   metoprolol  succinate  25 mg Oral Daily   midodrine  5 mg Oral TID WC   ondansetron (ZOFRAN) IV  4 mg Intravenous Once   rifaximin  550 mg Oral BID   sodium chloride flush  3 mL Intravenous Q12H   spironolactone   25 mg Oral Daily   Continuous Infusions:  [START ON 01/03/2024] sodium chloride     heparin Stopped (01/02/24 0707)   magnesium sulfate bolus IVPB 4 g (01/02/24 0819)   PRN Meds:.HYDROcodone-acetaminophen, ondansetron **OR** ondansetron (ZOFRAN) IV, sodium chloride flush   I  have personally reviewed following labs and imaging studies  LABORATORY DATA: CBC: Recent Labs  Lab 12/29/23 1537 12/29/23 1550 12/30/23 0345 12/31/23 0525 01/01/24 0527 01/02/24 0315  WBC 4.3  --  4.5 5.5 7.6 8.7  NEUTROABS 3.0  --   --   --   --   --   HGB 11.0* 12.9 10.2* 10.4* 10.5* 10.3*  HCT 33.5* 38.0 30.8* 31.5* 31.3* 30.5*  MCV 81.5  --  81.1 81.0 79.8* 79.4*  PLT 174  --  165 165 147* 124*    Basic Metabolic Panel: Recent Labs  Lab 12/29/23 1537 12/29/23 1550 12/29/23 2005 12/30/23 0639 12/31/23 0525 01/01/24 0527 01/02/24 0315  NA 124*   < > 125* 125* 127* 126* 127*  K 5.5*   < > 5.2* 4.7 5.0 4.2 3.9  CL 93*  --  94* 93* 93* 95* 94*  CO2 21*  --  19* 20* 21* 22 21*  GLUCOSE 83  --  75 65* 96 109* 95  BUN 32*  --  31* 28* 30* 29* 30*  CREATININE 1.54*  --  1.43* 1.38* 1.47* 1.39* 1.43*  CALCIUM 9.2  --  8.8* 8.5* 8.9 8.5* 8.4*  MG 2.0  --   --  1.8  --   --  1.6*  PHOS  --   --   --  3.4  --   --   --    < > = values in this interval not displayed.    GFR: Estimated Creatinine Clearance: 26.8 mL/min (A) (by C-G formula based on SCr of 1.43 mg/dL (H)).  Liver Function Tests: Recent Labs  Lab 12/29/23 1537 12/30/23 0639 12/31/23 0525 01/01/24 0527  AST 35 29 25 27   ALT 10 9 8 8   ALKPHOS 74 60 52 51  BILITOT 2.4* 2.1* 2.6* 2.5*  PROT 8.7* 7.4 7.2 6.7  ALBUMIN 3.0* 2.6* 3.2* 2.8*   No results for input(s): LIPASE, AMYLASE in the last 168 hours. Recent Labs  Lab 12/29/23 1537  AMMONIA 15    Coagulation Profile: Recent Labs  Lab 12/29/23 1537 12/30/23 0345 12/31/23 0525 01/01/24 0527 01/02/24 0315  INR 1.5* 1.6* 1.3* 2.0* 1.9*    Cardiac Enzymes: No results for input(s): CKTOTAL, CKMB, CKMBINDEX, TROPONINI in the last 168 hours.  BNP (last 3 results) No results for input(s): PROBNP in the last 8760 hours.  Lipid Profile: No results for input(s): CHOL, HDL, LDLCALC, TRIG, CHOLHDL, LDLDIRECT in the last  72 hours.  Thyroid Function Tests: No results for input(s): TSH, T4TOTAL, FREET4, T3FREE, THYROIDAB in the last 72 hours.   Anemia Panel: No results for input(s): VITAMINB12, FOLATE, FERRITIN, TIBC, IRON, RETICCTPCT in the last 72 hours.   Urine analysis:    Component Value Date/Time   COLORURINE YELLOW 12/29/2023 1900   APPEARANCEUR CLEAR 12/29/2023 1900   LABSPEC 1.009 12/29/2023 1900   PHURINE 5.0 12/29/2023 1900   GLUCOSEU NEGATIVE 12/29/2023 1900   HGBUR NEGATIVE 12/29/2023 1900   BILIRUBINUR NEGATIVE 12/29/2023 1900   KETONESUR NEGATIVE 12/29/2023 1900   PROTEINUR NEGATIVE 12/29/2023 1900   NITRITE NEGATIVE 12/29/2023 1900   LEUKOCYTESUR NEGATIVE 12/29/2023 1900    Sepsis Labs: Lactic Acid, Venous No results found for: LATICACIDVEN  MICROBIOLOGY: Recent Results (from the past 240 hours)  Resp panel by RT-PCR (RSV, Flu A&B, Covid) Anterior Nasal Swab     Status: None   Collection Time: 12/29/23  3:37 PM   Specimen: Anterior Nasal Swab  Result Value Ref Range Status   SARS Coronavirus 2 by RT PCR NEGATIVE NEGATIVE Final   Influenza A by PCR NEGATIVE NEGATIVE Final   Influenza B by PCR NEGATIVE NEGATIVE Final    Comment: (NOTE) The Xpert Xpress SARS-CoV-2/FLU/RSV plus assay is intended as an aid in the diagnosis of influenza from Nasopharyngeal swab specimens and should not be used as a sole basis for treatment. Nasal washings and aspirates are unacceptable for Xpert Xpress SARS-CoV-2/FLU/RSV testing.  Fact Sheet for Patients: BloggerCourse.com  Fact Sheet for Healthcare Providers: SeriousBroker.it  This test is not yet approved or cleared by the [place]  FDA and has been authorized for detection and/or diagnosis of SARS-CoV-2 by FDA under an Emergency Use Authorization (EUA). This EUA will remain in effect (meaning this test can be used) for the duration of the COVID-19  declaration under Section 564(b)(1) of the Act, 21 U.S.C. section 360bbb-3(b)(1), unless the authorization is terminated or revoked.     Resp Syncytial Virus by PCR NEGATIVE NEGATIVE Final    Comment: (NOTE) Fact Sheet for Patients: BloggerCourse.com  Fact Sheet for Healthcare Providers: SeriousBroker.it  This test is not yet approved or cleared by the [place]  FDA and has been authorized for detection and/or diagnosis of SARS-CoV-2 by FDA under an Emergency Use Authorization (EUA). This EUA will remain in effect (meaning this test can be used) for the duration  of the COVID-19 declaration under Section 564(b)(1) of the Act, 21 U.S.C. section 360bbb-3(b)(1), unless the authorization is terminated or revoked.  Performed at [facility], [address], [place], [place] [address]   Urine Culture     Status: None   Collection Time: 12/29/23 10:16 PM   Specimen: Urine, Clean Catch  Result Value Ref Range Status   Specimen Description URINE, CLEAN CATCH  Final   Special Requests NONE  Final   Culture   Final    NO GROWTH Performed at [facility], [address], [place], [place] [address]    Report Status 12/30/2023 FINAL  Final  MRSA Next Gen by PCR, Nasal     Status: None   Collection Time: 12/30/23  2:00 AM   Specimen: Nasal Mucosa; Nasal Swab  Result Value Ref Range Status   MRSA by PCR Next Gen NOT DETECTED NOT DETECTED Final    Comment: (NOTE) The GeneXpert MRSA Assay (FDA approved for NASAL specimens only), is one component of a comprehensive MRSA colonization surveillance program. It is not intended to diagnose MRSA infection nor to guide or monitor treatment for MRSA infections. Test performance is not FDA approved in patients less than 32 years old. Performed at [facility], [address] [place], [place] [address]   Gram stain     Status: None   Collection Time: 12/30/23 11:42 AM    Specimen: Abdomen; Peritoneal Fluid  Result Value Ref Range Status   Specimen Description ABDOMEN  Final   Special Requests PERITONEAL  Final   Gram Stain   Final    NO WBC SEEN NO ORGANISMS SEEN Performed at [facility], [address], [place], [place] [address]    Report Status 12/30/2023 FINAL  Final    RADIOLOGY STUDIES/RESULTS: No results found.    LOS: 3 days   Donalda Applebaum, MD  [facility]    To contact the attending provider between 7A-7P or the covering provider during after hours 7P-7A, please log into the web site [email or web address] and access using universal [place] password for that web site. If you do not have the password, please call the hospital operator.  01/02/2024, 9:53 AM

## 2024-01-02 NOTE — Progress Notes (Signed)
 Progress Note  Patient Name: Chelsea Boyer Date of Encounter: 01/02/2024  Primary Cardiologist:   Jerel Balding, MD   Subjective   Denies any chest pain or dyspnea.  Cr stable at 1.4.  I/Os incomplete.  Inpatient Medications    Scheduled Meds:  fluticasone  1 spray Each Nare Daily   furosemide  80 mg Intravenous BID   lactulose  20 g Oral BID   levothyroxine  50 mcg Intravenous Daily   metoprolol  succinate  25 mg Oral Daily   midodrine  5 mg Oral TID WC   ondansetron (ZOFRAN) IV  4 mg Intravenous Once   rifaximin  550 mg Oral BID   sodium chloride flush  3 mL Intravenous Q12H   spironolactone   25 mg Oral Daily   Continuous Infusions:  [START ON 01/03/2024] sodium chloride     heparin Stopped (01/02/24 0707)   magnesium sulfate bolus IVPB 4 g (01/02/24 0819)   PRN Meds: HYDROcodone-acetaminophen, ondansetron **OR** ondansetron (ZOFRAN) IV, sodium chloride flush   Vital Signs    Vitals:   01/01/24 1920 01/01/24 2344 01/02/24 0349 01/02/24 0700  BP: (!) 122/53 (!) 111/57 106/71 (!) 97/44  Pulse: 71 60 79   Resp: 13 18 17    Temp: (!) 97.5 F (36.4 C) 97.9 F (36.6 C) 97.9 F (36.6 C) 98 F (36.7 C)  TempSrc: Oral Oral Oral Oral  SpO2:   97%   Weight:      Height:        Intake/Output Summary (Last 24 hours) at 01/02/2024 1010 Last data filed at 01/02/2024 0500 Gross per 24 hour  Intake --  Output 920 ml  Net -920 ml   Filed Weights   12/29/23 1452 12/31/23 1343 01/01/24 1114  Weight: 63.5 kg 62.9 kg 61.6 kg    Telemetry    Atrial fib with controlled rate - Personally Reviewed  ECG    NA - Personally Reviewed  Physical Exam   GEN: No  acute distress.   Neck:  Mildly elevated JVD Cardiac: irregular  3/6 systolic murmur Respiratory: Clear  to auscultation bilaterally. GI: Soft, nontender, distended, normal bowel sounds  MS:  Mild edema; No deformity. Neuro:   Nonfocal  Psych: Oriented and appropriate    Labs    Chemistry Recent  Labs  Lab 12/30/23 0639 12/31/23 0525 01/01/24 0527 01/02/24 0315  NA 125* 127* 126* 127*  K 4.7 5.0 4.2 3.9  CL 93* 93* 95* 94*  CO2 20* 21* 22 21*  GLUCOSE 65* 96 109* 95  BUN 28* 30* 29* 30*  CREATININE 1.38* 1.47* 1.39* 1.43*  CALCIUM 8.5* 8.9 8.5* 8.4*  PROT 7.4 7.2 6.7  --   ALBUMIN 2.6* 3.2* 2.8*  --   AST 29 25 27   --   ALT 9 8 8   --   ALKPHOS 60 52 51  --   BILITOT 2.1* 2.6* 2.5*  --   GFRNONAA 39* 36* 39* 37*  ANIONGAP 12 13 9 12      Hematology Recent Labs  Lab 12/31/23 0525 01/01/24 0527 01/02/24 0315  WBC 5.5 7.6 8.7  RBC 3.89 3.92 3.84*  HGB 10.4* 10.5* 10.3*  HCT 31.5* 31.3* 30.5*  MCV 81.0 79.8* 79.4*  MCH 26.7 26.8 26.8  MCHC 33.0 33.5 33.8  RDW 18.5* 18.3* 18.2*  PLT 165 147* 124*    Cardiac EnzymesNo results for input(s): TROPONINI in the last 168 hours. No results for input(s): TROPIPOC in the last 168 hours.  BNP Recent Labs  Lab 12/29/23 1537  BNP 639.2*     DDimer No results for input(s): DDIMER in the last 168 hours.   Radiology    No results found.   Cardiac Studies   Echo above  Patient Profile     78 y.o. female with a history of right heart failure,  permanent atrial fibrillation on Coumadin , suspected rheumatic heart disease s/p multiple valvular surgeries (mitral valve replacement with a biological prosthesis in 1986 subsequent replacement of the biological prosthesis with a St Jude mechanical valve in 1996 and then TAVR 10/2016), severe tricuspid regurgitation, pulmonary hypertension, systemic hypertension, hepatic cirrhosis, CKD stage IIb, hypothyroidism, and anemia who is being seen 12/30/2023 for the evaluation of CHF     Assessment & Plan   Acute on chronic diastolic HF:   Presented with volume overload.  On IV Lasix.  Echocardiogram shows EF 55 to 60%, moderately reduced RV function with severe enlargement, severely elevated pulmonary pressures (RVSP 77 mmHg), severe biatrial enlargement, mechanical mitral  valve with mean gradient 8 mmHg, severe tricuspid regurgitation, TAVR valve with moderate to severe aortic stenosis with mean gradient 33 mmHg.   -Planning LHC/RHC today with Dr Zenaida -Continue IV lasix   Mechanical mitral valve:  Stable valve.  Continue heparin.    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  Baseline   TAVR: Now with moderate to severe aortic stenosis, planning LHC/RHC today.    Chronic atrial fib:  On toprol  XL 25 mg daily. Rate is controlled.   Continue IV heparin since planning cath  For questions or updates, please contact [facility] Please consult [email or web address] for contact info under Cardiology/STEMI.   Signed, Lonni LITTIE Nanas, MD  01/02/2024, 10:10 AM

## 2024-01-02 NOTE — H&P (View-Only) (Signed)
 Progress Note  Patient Name: Chelsea Boyer Date of Encounter: 01/02/2024  Primary Cardiologist:   Jerel Balding, MD   Subjective   Denies any chest pain or dyspnea.  Cr stable at 1.4.  I/Os incomplete.  Inpatient Medications    Scheduled Meds:  fluticasone  1 spray Each Nare Daily   furosemide  80 mg Intravenous BID   lactulose  20 g Oral BID   levothyroxine  50 mcg Intravenous Daily   metoprolol  succinate  25 mg Oral Daily   midodrine  5 mg Oral TID WC   ondansetron (ZOFRAN) IV  4 mg Intravenous Once   rifaximin  550 mg Oral BID   sodium chloride flush  3 mL Intravenous Q12H   spironolactone   25 mg Oral Daily   Continuous Infusions:  [START ON 01/03/2024] sodium chloride     heparin Stopped (01/02/24 0707)   magnesium sulfate bolus IVPB 4 g (01/02/24 0819)   PRN Meds: HYDROcodone-acetaminophen, ondansetron **OR** ondansetron (ZOFRAN) IV, sodium chloride flush   Vital Signs    Vitals:   01/01/24 1920 01/01/24 2344 01/02/24 0349 01/02/24 0700  BP: (!) 122/53 (!) 111/57 106/71 (!) 97/44  Pulse: 71 60 79   Resp: 13 18 17    Temp: (!) 97.5 F (36.4 C) 97.9 F (36.6 C) 97.9 F (36.6 C) 98 F (36.7 C)  TempSrc: Oral Oral Oral Oral  SpO2:   97%   Weight:      Height:        Intake/Output Summary (Last 24 hours) at 01/02/2024 1010 Last data filed at 01/02/2024 0500 Gross per 24 hour  Intake --  Output 920 ml  Net -920 ml   Filed Weights   12/29/23 1452 12/31/23 1343 01/01/24 1114  Weight: 63.5 kg 62.9 kg 61.6 kg    Telemetry    Atrial fib with controlled rate - Personally Reviewed  ECG    NA - Personally Reviewed  Physical Exam   GEN: No  acute distress.   Neck:  Mildly elevated JVD Cardiac: irregular  3/6 systolic murmur Respiratory: Clear  to auscultation bilaterally. GI: Soft, nontender, distended, normal bowel sounds  MS:  Mild edema; No deformity. Neuro:   Nonfocal  Psych: Oriented and appropriate    Labs    Chemistry Recent  Labs  Lab 12/30/23 0639 12/31/23 0525 01/01/24 0527 01/02/24 0315  NA 125* 127* 126* 127*  K 4.7 5.0 4.2 3.9  CL 93* 93* 95* 94*  CO2 20* 21* 22 21*  GLUCOSE 65* 96 109* 95  BUN 28* 30* 29* 30*  CREATININE 1.38* 1.47* 1.39* 1.43*  CALCIUM 8.5* 8.9 8.5* 8.4*  PROT 7.4 7.2 6.7  --   ALBUMIN 2.6* 3.2* 2.8*  --   AST 29 25 27   --   ALT 9 8 8   --   ALKPHOS 60 52 51  --   BILITOT 2.1* 2.6* 2.5*  --   GFRNONAA 39* 36* 39* 37*  ANIONGAP 12 13 9 12      Hematology Recent Labs  Lab 12/31/23 0525 01/01/24 0527 01/02/24 0315  WBC 5.5 7.6 8.7  RBC 3.89 3.92 3.84*  HGB 10.4* 10.5* 10.3*  HCT 31.5* 31.3* 30.5*  MCV 81.0 79.8* 79.4*  MCH 26.7 26.8 26.8  MCHC 33.0 33.5 33.8  RDW 18.5* 18.3* 18.2*  PLT 165 147* 124*    Cardiac EnzymesNo results for input(s): TROPONINI in the last 168 hours. No results for input(s): TROPIPOC in the last 168 hours.  BNP Recent Labs  Lab 12/29/23 1537  BNP 639.2*     DDimer No results for input(s): DDIMER in the last 168 hours.   Radiology    No results found.   Cardiac Studies   Echo above  Patient Profile     78 y.o. female with a history of right heart failure,  permanent atrial fibrillation on Coumadin , suspected rheumatic heart disease s/p multiple valvular surgeries (mitral valve replacement with a biological prosthesis in 1986 subsequent replacement of the biological prosthesis with a St Jude mechanical valve in 1996 and then TAVR 10/2016), severe tricuspid regurgitation, pulmonary hypertension, systemic hypertension, hepatic cirrhosis, CKD stage IIb, hypothyroidism, and anemia who is being seen 12/30/2023 for the evaluation of CHF     Assessment & Plan   Acute on chronic diastolic HF:   Presented with volume overload.  On IV Lasix.  Echocardiogram shows EF 55 to 60%, moderately reduced RV function with severe enlargement, severely elevated pulmonary pressures (RVSP 77 mmHg), severe biatrial enlargement, mechanical mitral  valve with mean gradient 8 mmHg, severe tricuspid regurgitation, TAVR valve with moderate to severe aortic stenosis with mean gradient 33 mmHg.   -Planning LHC/RHC today with Dr Zenaida -Continue IV lasix   Mechanical mitral valve:  Stable valve.  Continue heparin.    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  Baseline   TAVR: Now with moderate to severe aortic stenosis, planning LHC/RHC today.    Chronic atrial fib:  On toprol  XL 25 mg daily. Rate is controlled.   Continue IV heparin since planning cath  For questions or updates, please contact [facility] Please consult [email or web address] for contact info under Cardiology/STEMI.   Signed, Lonni LITTIE Nanas, MD  01/02/2024, 10:10 AM

## 2024-01-02 NOTE — Progress Notes (Signed)
 Physical Therapy Treatment Patient Details Name: Chelsea Boyer MRN: [id number] DOB: 06/29/1945 Today's Date: 01/02/2024   History of Present Illness Pt is a 78 y.o female presenting to the ED 10/9 for hyponatremia and volume overload related to cirrhosis. EFY:rpmmyndpd, CHF, CKD, hypothyroidism, s/p TAVR    PT Comments  Continuing work on functional mobility and activity tolerance; session focused on efforts for ambulation using the rolling walker to decrease right hip pain in stance; recommend a youth size rolling walker for next session so that patient can accept more weight onto the rolling walker, and take weight off of her painful R hip; she was able to get to the bathroom, and void (RN notified), and walk back around the bed to sit by the window; noteworthy that with increased time in weight-bearing on her right lower extremity in stance, pain in her hip increased, and the pain subsided when she sat down; worth considering an x-ray of her right hip for a closer look at the possibility of arthritis; noted in most recent abdominal CT, right hip OA was not mentioned, but she does have a history of left total hip arthroplasty    If plan is discharge home, recommend the following: A little help with walking and/or transfers;A little help with bathing/dressing/bathroom;Assistance with cooking/housework;Assist for transportation;Help with stairs or ramp for entrance   Can travel by private vehicle        Equipment Recommendations  Rolling walker (2 wheels);BSC/3in1 (Youth-sized, please)    Recommendations for Other Services       Precautions / Restrictions Precautions Precautions: Fall Recall of Precautions/Restrictions: Intact Precaution/Restrictions Comments: watch BP; she typically runs low Restrictions Weight Bearing Restrictions Per Provider Order: No     Mobility  Bed Mobility                    Transfers Overall transfer level: Needs assistance Equipment used:  Rolling walker (2 wheels) Transfers: Sit to/from Stand Sit to Stand: Contact guard assist, Min assist           General transfer comment: Cues for hand placement. Min assist to control descent to sit on commode in bathroom    Ambulation/Gait Ambulation/Gait assistance: Contact guard assist Gait Distance (Feet): 20 Feet (x2) Assistive device: Rolling walker (2 wheels) Gait Pattern/deviations: Step-to pattern, Decreased stance time - right, Decreased step length - left, Decreased weight shift to right, Antalgic, Trunk flexed, Narrow base of support       General Gait Details: Pt takes slow, small, antalgic steps, ambulating with a step-to pattern due to decreased R weight shift and stance time due to R hip pain thus decreased L step length. Noting incr R hip pain with incr time doing weight bearing activity; Cues provided to offload R leg through pushing down on RW during R stance phase.   Stairs             Wheelchair Mobility     Tilt Bed    Modified Rankin (Stroke Patients Only)       Balance     Sitting balance-Leahy Scale: Good       Standing balance-Leahy Scale: Poor Standing balance comment: reliant on RW                            Communication Communication Communication: No apparent difficulties  Cognition Arousal: Alert Behavior During Therapy: WFL for tasks assessed/performed   PT - Cognitive impairments: No family/caregiver present to  determine baseline                       PT - Cognition Comments: A&Ox4, pt aware her husband believed her to have AMS but feels she is back to her baseline. Follows cues well Following commands: Intact      Cueing Cueing Techniques: Verbal cues  Exercises      General Comments General comments (skin integrity, edema, etc.): BP seated after short distance amb 101/56, HR 75, O2 sats 95%      Pertinent Vitals/Pain Pain Assessment Pain Assessment: Faces Faces Pain Scale: Hurts even  more Pain Location: back, R hip Pain Descriptors / Indicators: Discomfort, Sore, Grimacing, Guarding Pain Intervention(s): Monitored during session    Home Living                          Prior Function            PT Goals (current goals can now be found in the care plan section) Acute Rehab PT Goals Patient Stated Goal: to improve and go home PT Goal Formulation: With patient Time For Goal Achievement: 01/13/24 Potential to Achieve Goals: Good Progress towards PT goals: Progressing toward goals    Frequency    Min 2X/week      PT Plan      Co-evaluation              AM-PAC PT 6 Clicks Mobility   Outcome Measure  Help needed turning from your back to your side while in a flat bed without using bedrails?: A Little Help needed moving from lying on your back to sitting on the side of a flat bed without using bedrails?: A Lot Help needed moving to and from a bed to a chair (including a wheelchair)?: A Little Help needed standing up from a chair using your arms (e.g., wheelchair or bedside chair)?: A Little Help needed to walk in hospital room?: A Little Help needed climbing 3-5 steps with a railing? : A Lot 6 Click Score: 16    End of Session Equipment Utilized During Treatment: Gait belt Activity Tolerance: Patient tolerated treatment well Patient left: in chair;with call bell/phone within reach;with family/visitor present Nurse Communication: Mobility status PT Visit Diagnosis: Unsteadiness on feet (R26.81);Other abnormalities of gait and mobility (R26.89);Muscle weakness (generalized) (M62.81);Difficulty in walking, not elsewhere classified (R26.2)     Time: 8844-8776 PT Time Calculation (min) (ACUTE ONLY): 28 min  Charges:    $Gait Training: 8-22 mins $Therapeutic Activity: 8-22 mins PT General Charges $$ ACUTE PT VISIT: 1 Visit                     Chelsea Boyer, PT  Acute Rehabilitation Services Office [phone] Secure Chat  welcomed    Chelsea Boyer 01/02/2024, 1:00 PM

## 2024-01-02 NOTE — Interval H&P Note (Signed)
 History and Physical Interval Note:  01/02/2024 1:48 PM  Chelsea Boyer Sample  has presented today for surgery, with the diagnosis of Right sided HF.  The various methods of treatment have been discussed with the patient and family. After consideration of risks, benefits and other options for treatment, the patient has consented to  Procedure(s): RIGHT/LEFT HEART CATH AND CORONARY ANGIOGRAPHY (N/A) as a surgical intervention.  The patient's history has been reviewed, patient examined, no change in status, stable for surgery.  I have reviewed the patient's chart and labs.  Questions were answered to the patient's satisfaction.     [name]

## 2024-01-02 NOTE — Progress Notes (Signed)
 PHARMACY - ANTICOAGULATION CONSULT NOTE  Pharmacy Consult for Heparin Indication: mechanical mitral valve and subtherapeutic INR on warfarin  No Known Allergies  Patient Measurements: Height: 5' 1 (154.9 cm) Weight: 61.6 kg (135 lb 12.9 oz) IBW/kg (Calculated) : 47.8 HEPARIN DW (KG): 60.9  Vital Signs: Temp: 98 F (36.7 C) (10/13 0700) Temp Source: Oral (10/13 0700) BP: 97/44 (10/13 0700) Pulse Rate: 79 (10/13 0349)  Labs: Recent Labs    12/31/23 0525 12/31/23 1457 01/01/24 0527 01/01/24 1101 01/01/24 1951 01/02/24 0315  HGB 10.4*  --  10.5*  --   --  10.3*  HCT 31.5*  --  31.3*  --   --  30.5*  PLT 165  --  147*  --   --  124*  LABPROT 17.0*  --  23.5*  --   --  23.0*  INR 1.3*  --  2.0*  --   --  1.9*  HEPARINUNFRC >1.10*   < >  --  0.31 0.43 0.37  CREATININE 1.47*  --  1.39*  --   --  1.43*   < > = values in this interval not displayed.   Estimated Creatinine Clearance: 26.8 mL/min (A) (by C-G formula based on SCr of 1.43 mg/dL (H)).  Infusions:   [START ON 01/03/2024] sodium chloride     heparin Stopped (01/02/24 0707)   magnesium sulfate bolus IVPB 4 g (01/02/24 0819)    Assessment: Patient is a 78 YO F presenting with abdominal distension, peripheral edema, and fatigue. 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 According to last anticoag clinic note on 9/25, warfarin regimen was one-half 5 mg tablet daily and INR was 3 at that time. Warfarin on hold for possible R/L cardiac cath on Monday 10/13.   Heparin level therapeutic (0.37) on infusion at 950 units/hr - drawn prior to heparin being placed on hold. No bleeding noted.   Goal of Therapy:  Heparin level 0.3-0.7 units/ml Monitor platelets by anticoagulation protocol: Yes   Plan:  Heparin on hold currently with call from cath lab - pt going shortly Will f/u [facility] plans post cath  Thank you for allowing pharmacy to be part of this patients care  team.  Vito Ralph, PharmD, BCPS Please see amion for complete clinical pharmacist phone list 01/02/2024 9:40 AM

## 2024-01-02 NOTE — Plan of Care (Signed)

## 2024-01-02 NOTE — Progress Notes (Signed)
 PHARMACY - ANTICOAGULATION CONSULT NOTE  Pharmacy Consult for Heparin Indication: mechanical mitral valve and subtherapeutic INR on warfarin  No Known Allergies  Patient Measurements: Height: 5' 1 (154.9 cm) Weight: 61.6 kg (135 lb 12.9 oz) IBW/kg (Calculated) : 47.8 HEPARIN DW (KG): 60.3  Vital Signs: Temp: 97.9 F (36.6 C) (10/13 1712) Temp Source: Oral (10/13 1712) BP: 128/51 (10/13 1712) Pulse Rate: 68 (10/13 1712)  Labs: Recent Labs    12/31/23 0525 12/31/23 1457 01/01/24 0527 01/01/24 1101 01/01/24 1951 01/02/24 0315  HGB 10.4*  --  10.5*  --   --  10.3*  HCT 31.5*  --  31.3*  --   --  30.5*  PLT 165  --  147*  --   --  124*  LABPROT 17.0*  --  23.5*  --   --  23.0*  INR 1.3*  --  2.0*  --   --  1.9*  HEPARINUNFRC >1.10*   < >  --  0.31 0.43 0.37  CREATININE 1.47*  --  1.39*  --   --  1.43*   < > = values in this interval not displayed.   Estimated Creatinine Clearance: 26.8 mL/min (A) (by C-G formula based on SCr of 1.43 mg/dL (H)).  Infusions:   heparin Stopped (01/02/24 0707)    Assessment: Patient is a 78 YO F presenting with abdominal distension, peripheral edema, and fatigue. 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 According to last anticoag clinic note on 9/25, warfarin regimen was one-half 5 mg tablet daily and INR was 3 at that time. Warfarin on hold for possible R/L cardiac cath on Monday 10/13.   Heparin level therapeutic (0.37) on infusion at 950 units/hr - drawn prior to heparin being placed on hold. No bleeding noted.   PM: s/p cath (see report). Per d/w Cards on-call, ok to resume heparin/warfarin 8hr after TR band removed (off ~19:00 per RN).  Goal of Therapy:  Heparin level 0.3-0.7 units/ml Monitor platelets by anticoagulation protocol: Yes   Plan:  8hr after TR band off, resume IV heparin 950 units/hr and give warfarin 2.5mg  PO x 1 Check heparin level in 8hr and  daily Daily PT/INR - f/u dc heparin when therapeutic  Thank you for allowing pharmacy to be part of this patients care team.  Rocky Slade, PharmD, BCPS Please see amion for complete clinical pharmacist phone list 01/02/2024 7:23 PM

## 2024-01-02 NOTE — Care Management Important Message (Signed)
 Important Message  Patient Details  Name: Chelsea Boyer MRN: [id number] Date of Birth: April 11, 1945   Important Message Given:  Yes - Medicare IM     Claretta Deed 01/02/2024, 3:59 PM

## 2024-01-03 ENCOUNTER — Ambulatory Visit

## 2024-01-03 ENCOUNTER — Telehealth: Payer: Self-pay | Admitting: Cardiovascular Disease

## 2024-01-03 ENCOUNTER — Ambulatory Visit: Admitting: Cardiovascular Disease

## 2024-01-03 ENCOUNTER — Encounter (HOSPITAL_COMMUNITY): Payer: Self-pay | Admitting: Cardiology

## 2024-01-03 DIAGNOSIS — K7290 Hepatic failure, unspecified without coma: Secondary | ICD-10-CM | POA: Diagnosis not present

## 2024-01-03 DIAGNOSIS — I50810 Right heart failure, unspecified: Secondary | ICD-10-CM

## 2024-01-03 DIAGNOSIS — R531 Weakness: Secondary | ICD-10-CM

## 2024-01-03 DIAGNOSIS — Z515 Encounter for palliative care: Secondary | ICD-10-CM

## 2024-01-03 DIAGNOSIS — I5033 Acute on chronic diastolic (congestive) heart failure: Secondary | ICD-10-CM | POA: Diagnosis not present

## 2024-01-03 DIAGNOSIS — K7460 Unspecified cirrhosis of liver: Secondary | ICD-10-CM | POA: Diagnosis not present

## 2024-01-03 DIAGNOSIS — R7989 Other specified abnormal findings of blood chemistry: Secondary | ICD-10-CM | POA: Diagnosis not present

## 2024-01-03 DIAGNOSIS — E871 Hypo-osmolality and hyponatremia: Secondary | ICD-10-CM | POA: Diagnosis not present

## 2024-01-03 DIAGNOSIS — Z7189 Other specified counseling: Secondary | ICD-10-CM

## 2024-01-03 DIAGNOSIS — I4821 Permanent atrial fibrillation: Secondary | ICD-10-CM | POA: Diagnosis not present

## 2024-01-03 DIAGNOSIS — E875 Hyperkalemia: Secondary | ICD-10-CM | POA: Diagnosis not present

## 2024-01-03 DIAGNOSIS — R188 Other ascites: Secondary | ICD-10-CM | POA: Diagnosis not present

## 2024-01-03 LAB — POCT I-STAT EG7
Acid-base deficit: 1 mmol/L (ref 0.0–2.0)
Acid-base deficit: 1 mmol/L (ref 0.0–2.0)
Bicarbonate: 24.1 mmol/L (ref 20.0–28.0)
Bicarbonate: 24.3 mmol/L (ref 20.0–28.0)
Calcium, Ion: 1.17 mmol/L (ref 1.15–1.40)
Calcium, Ion: 1.18 mmol/L (ref 1.15–1.40)
HCT: 33 % — ABNORMAL LOW (ref 36.0–46.0)
HCT: 33 % — ABNORMAL LOW (ref 36.0–46.0)
Hemoglobin: 11.2 g/dL — ABNORMAL LOW (ref 12.0–15.0)
Hemoglobin: 11.2 g/dL — ABNORMAL LOW (ref 12.0–15.0)
O2 Saturation: 65 %
O2 Saturation: 70 %
Potassium: 3.9 mmol/L (ref 3.5–5.1)
Potassium: 3.9 mmol/L (ref 3.5–5.1)
Sodium: 128 mmol/L — ABNORMAL LOW (ref 135–145)
Sodium: 129 mmol/L — ABNORMAL LOW (ref 135–145)
TCO2: 25 mmol/L (ref 22–32)
TCO2: 26 mmol/L (ref 22–32)
pCO2, Ven: 40.1 mmHg — ABNORMAL LOW (ref 44–60)
pCO2, Ven: 41.3 mmHg — ABNORMAL LOW (ref 44–60)
pH, Ven: 7.378 (ref 7.25–7.43)
pH, Ven: 7.387 (ref 7.25–7.43)
pO2, Ven: 35 mmHg (ref 32–45)
pO2, Ven: 37 mmHg (ref 32–45)

## 2024-01-03 LAB — BASIC METABOLIC PANEL WITH GFR
Anion gap: 12 (ref 5–15)
BUN: 34 mg/dL — ABNORMAL HIGH (ref 8–23)
CO2: 22 mmol/L (ref 22–32)
Calcium: 8.2 mg/dL — ABNORMAL LOW (ref 8.9–10.3)
Chloride: 92 mmol/L — ABNORMAL LOW (ref 98–111)
Creatinine, Ser: 1.58 mg/dL — ABNORMAL HIGH (ref 0.44–1.00)
GFR, Estimated: 33 mL/min — ABNORMAL LOW (ref >60)
Glucose, Bld: 102 mg/dL — ABNORMAL HIGH (ref 70–99)
Potassium: 4.1 mmol/L (ref 3.5–5.1)
Sodium: 126 mmol/L — ABNORMAL LOW (ref 135–145)

## 2024-01-03 LAB — CBC
HCT: 30.2 % — ABNORMAL LOW (ref 36.0–46.0)
Hemoglobin: 10.1 g/dL — ABNORMAL LOW (ref 12.0–15.0)
MCH: 26.9 pg (ref 26.0–34.0)
MCHC: 33.4 g/dL (ref 30.0–36.0)
MCV: 80.5 fL (ref 80.0–100.0)
Platelets: 112 K/uL — ABNORMAL LOW (ref 150–400)
RBC: 3.75 MIL/uL — ABNORMAL LOW (ref 3.87–5.11)
RDW: 18.7 % — ABNORMAL HIGH (ref 11.5–15.5)
WBC: 7.7 K/uL (ref 4.0–10.5)
nRBC: 0 % (ref 0.0–0.2)

## 2024-01-03 LAB — HEPARIN LEVEL (UNFRACTIONATED)
Heparin Unfractionated: <0.1 [IU]/mL — ABNORMAL LOW (ref 0.30–0.70)
Heparin Unfractionated: 0.12 [IU]/mL — ABNORMAL LOW (ref 0.30–0.70)
Heparin Unfractionated: 0.3 [IU]/mL (ref 0.30–0.70)
Heparin Unfractionated: 0.36 [IU]/mL (ref 0.30–0.70)

## 2024-01-03 LAB — MAGNESIUM: Magnesium: 2.5 mg/dL — ABNORMAL HIGH (ref 1.7–2.4)

## 2024-01-03 LAB — PROTIME-INR
INR: 1.7 — ABNORMAL HIGH (ref 0.8–1.2)
Prothrombin Time: 20.7 s — ABNORMAL HIGH (ref 11.4–15.2)

## 2024-01-03 LAB — PHOSPHORUS: Phosphorus: 3.4 mg/dL (ref 2.5–4.6)

## 2024-01-03 LAB — AFP TUMOR MARKER: AFP, Serum, Tumor Marker: <1.8 ng/mL (ref 0.0–9.2)

## 2024-01-03 MED ORDER — WARFARIN SODIUM 5 MG PO TABS
5.0000 mg | ORAL_TABLET | Freq: Once | ORAL | Status: AC
  Administered 2024-01-03: 5 mg via ORAL
  Filled 2024-01-03: qty 1

## 2024-01-03 NOTE — Progress Notes (Signed)
 PROGRESS NOTE        PATIENT DETAILS Name: Chelsea Boyer Age: 78 y.o. Sex: female Date of Birth: Oct 06, 1945 Admit Date: 12/29/2023 Admitting Physician Sigurd Pac, MD ERE:[email or web address], Lamarr RAMAN, MD  Brief Summary: Patient is a 78 y.o.  female with history of Hollie liver cirrhosis, chronic HFpEF/RV failure, A-fib on Coumadin , mechanical mitral valve replacement, TAVR-who presented with anasarca-thought to be secondary to combination of decompensated liver cirrhosis and RV failure.  Significant events: 10/9>> admit to [facility]  Significant studies: 10/9>> CT head: No acute intracranial abnormality. 10/9>> CT abdomen/pelvis: Cirrhosis-large volume ascites. 10/9>> CXR: No acute abnormality. 10/10>> echo: EF 55-60%, RV systolic function moderately reduced, RVSP 76.8.  Moderate to severe aortic valve stenosis.  Significant microbiology data: 10/9>> COVID/influenza/RSV PCR: Negative 10/9>> blood culture: No growth  Procedures: 10/10>> paracentesis (WBC 105,8% neutrophils). 10/13>> RHC (RV 80/31, PCW P: 20 mmHg)  Consults: GI Cardiology  Subjective: No major issues overnight.  Objective: Vitals: Blood pressure (!) 110/49, pulse 77, temperature 97.9 F (36.6 C), temperature source Oral, resp. rate 18, height 5' 1 (1.549 m), weight 60.9 kg, SpO2 96%.   Exam: Gen Exam:Alert awake-not in any distress HEENT:atraumatic, normocephalic Chest: B/L clear to auscultation anteriorly CVS:S1S2 regular Abdomen:soft non tender, non distended Extremities:no edema Neurology: Non focal Skin: no rash  Pertinent Labs/Radiology:    Latest Ref Rng & Units 01/03/2024    2:40 AM 01/02/2024    3:15 AM 01/01/2024    5:27 AM  CBC  WBC 4.0 - 10.5 K/uL 7.7  8.7  7.6   Hemoglobin 12.0 - 15.0 g/dL 89.8  89.6  89.4   Hematocrit 36.0 - 46.0 % 30.2  30.5  31.3   Platelets 150 - 400 K/uL 112  124  147     Lab Results  Component Value Date   NA 126 (L) 01/03/2024   K 4.1  01/03/2024   CL 92 (L) 01/03/2024   CO2 22 01/03/2024      Assessment/Plan: Anasarca/ascites Secondary to decompensated liver cirrhosis and HFpEF/RV failure exacerbation. Volume status gradually improving-continue continue diuretics-Lasix 80 mg twice daily/Aldactone  25 daily Daily weights/intake/output Monitor electrolytes  Acute on chronic HFpEF RV failure Pulmonary hypertension Contributing to massive anasarca Volume status gradually improving-on midodrine for BP support.   Continue diuretics-watch electrolytes/intake/output/weights. RHC on 10/13  does confirm severe pulmonary hypertension with elevated wedge.  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 Probably exacerbating RV failure S/p RHC on 10/13-PVR prohibitive for TAVR. Palliative care consultation placed on 10/14.  Hyponatremia Secondary to hypervolemia-from CHF/liver cirrhosis Remains asymptomatic Continue diuretics/fluid restriction If continues to have hyponatremia-may need a dose of Samsca. Repeat electrolytes tomorrow.  Decompensated NASH liver cirrhosis Continue diuretics S/p paracentesis 10/10-no evidence of SBP Mostly very awake/alert-but does have very mild occasional confusion-continue lactulose/rifaximin.  Chronic A-fib Rate controlled-metoprolol  Coumadin  held for RHC/LHC-remains on IV heparin-Coumadin  being resumed-team following.  History of mechanical mitral valve replacement Coumadin  held for RHC/LHC-remains on IV heparin-Coumadin  being resumed-team following.  Hypothyroidism TSH elevated-19.5 on 10/9-felt to be due to poor absorption of Synthroid through edematous cut Currently on IV Synthroid-switch to oral route closer to discharge.  Normocytic anemia Likely secondary to acute illness superimposed on underlying chronic disease Monitor Hb  Debility/deconditioning PT/OT eval-Home health recommended.  Palliative care/goals of care Full code for  now Briefly discussed with patient  regarding complexity of her underlying issues-risk of recurrence-no good long-term solutions.  RHC confirms that patient is not a candidate for repeat TAVR.  Palliative care consultation placed 10/14.  Code status:   Code Status: Full Code   DVT Prophylaxis: Place TED hose Start: 12/31/23 1303 SCDs Start: 12/30/23 0511 IV Heparin   Family Communication: Spouse at bedside on 10/13-none at bedside this morning.   Disposition Plan: Status is: Inpatient Remains inpatient appropriate because: Severity of illness   Planned Discharge Destination:Home health   Diet: Diet Order             Diet regular Room service appropriate? Yes; Fluid consistency: Thin  Diet effective now                     Antimicrobial agents: Anti-infectives (From admission, onward)    Start     Dose/Rate Route Frequency Ordered Stop   12/30/23 1030  rifaximin (XIFAXAN) tablet 550 mg        550 mg Oral 2 times daily 12/30/23 1029     12/30/23 0200  doxycycline (VIBRA-TABS) tablet 100 mg  Status:  Discontinued        100 mg Oral Every 12 hours 12/29/23 2347 12/30/23 1244        MEDICATIONS: Scheduled Meds:  fluticasone  1 spray Each Nare Daily   furosemide  80 mg Intravenous BID   lactulose  20 g Oral BID   levothyroxine  50 mcg Intravenous Daily   metoprolol  succinate  25 mg Oral Daily   midodrine  5 mg Oral TID WC   ondansetron (ZOFRAN) IV  4 mg Intravenous Once   rifaximin  550 mg Oral BID   sodium chloride flush  3 mL Intravenous Q12H   spironolactone   25 mg Oral Daily   Warfarin - Pharmacist Dosing Inpatient   Does not apply q1600   Continuous Infusions:  heparin 950 Units/hr (01/03/24 0303)   PRN Meds:.HYDROcodone-acetaminophen, ondansetron **OR** ondansetron (ZOFRAN) IV, sodium chloride flush   I have personally reviewed following labs and imaging studies  LABORATORY DATA: CBC: Recent Labs  Lab 12/29/23 1537 12/29/23 1550 12/30/23 0345  12/31/23 0525 01/01/24 0527 01/02/24 0315 01/03/24 0240  WBC 4.3  --  4.5 5.5 7.6 8.7 7.7  NEUTROABS 3.0  --   --   --   --   --   --   HGB 11.0*   < > 10.2* 10.4* 10.5* 10.3* 10.1*  HCT 33.5*   < > 30.8* 31.5* 31.3* 30.5* 30.2*  MCV 81.5  --  81.1 81.0 79.8* 79.4* 80.5  PLT 174  --  165 165 147* 124* 112*   < > = values in this interval not displayed.    Basic Metabolic Panel: Recent Labs  Lab 12/29/23 1537 12/29/23 1550 12/30/23 0639 12/31/23 0525 01/01/24 0527 01/02/24 0315 01/03/24 0240  NA 124*   < > 125* 127* 126* 127* 126*  K 5.5*   < > 4.7 5.0 4.2 3.9 4.1  CL 93*   < > 93* 93* 95* 94* 92*  CO2 21*   < > 20* 21* 22 21* 22  GLUCOSE 83   < > 65* 96 109* 95 102*  BUN 32*   < > 28* 30* 29* 30* 34*  CREATININE 1.54*   < > 1.38* 1.47* 1.39* 1.43* 1.58*  CALCIUM 9.2   < > 8.5* 8.9 8.5* 8.4* 8.2*  MG 2.0  --  1.8  --   --  1.6* 2.5*  PHOS  --   --  3.4  --   --   --  3.4   < > = values in this interval not displayed.    GFR: Estimated Creatinine Clearance: 24.2 mL/min (A) (by C-G formula based on SCr of 1.58 mg/dL (H)).  Liver Function Tests: Recent Labs  Lab 12/29/23 1537 12/30/23 0639 12/31/23 0525 01/01/24 0527  AST 35 29 25 27   ALT 10 9 8 8   ALKPHOS 74 60 52 51  BILITOT 2.4* 2.1* 2.6* 2.5*  PROT 8.7* 7.4 7.2 6.7  ALBUMIN 3.0* 2.6* 3.2* 2.8*   No results for input(s): LIPASE, AMYLASE in the last 168 hours. Recent Labs  Lab 12/29/23 1537  AMMONIA 15    Coagulation Profile: Recent Labs  Lab 12/29/23 1537 12/30/23 0345 12/31/23 0525 01/01/24 0527 01/02/24 0315  INR 1.5* 1.6* 1.3* 2.0* 1.9*    Cardiac Enzymes: No results for input(s): CKTOTAL, CKMB, CKMBINDEX, TROPONINI in the last 168 hours.  BNP (last 3 results) No results for input(s): PROBNP in the last 8760 hours.  Lipid Profile: No results for input(s): CHOL, HDL, LDLCALC, TRIG, CHOLHDL, LDLDIRECT in the last 72 hours.  Thyroid Function Tests: No results  for input(s): TSH, T4TOTAL, FREET4, T3FREE, THYROIDAB in the last 72 hours.   Anemia Panel: No results for input(s): VITAMINB12, FOLATE, FERRITIN, TIBC, IRON, RETICCTPCT in the last 72 hours.   Urine analysis:    Component Value Date/Time   COLORURINE YELLOW 12/29/2023 1900   APPEARANCEUR CLEAR 12/29/2023 1900   LABSPEC 1.009 12/29/2023 1900   PHURINE 5.0 12/29/2023 1900   GLUCOSEU NEGATIVE 12/29/2023 1900   HGBUR NEGATIVE 12/29/2023 1900   BILIRUBINUR NEGATIVE 12/29/2023 1900   KETONESUR NEGATIVE 12/29/2023 1900   PROTEINUR NEGATIVE 12/29/2023 1900   NITRITE NEGATIVE 12/29/2023 1900   LEUKOCYTESUR NEGATIVE 12/29/2023 1900    Sepsis Labs: Lactic Acid, Venous No results found for: LATICACIDVEN  MICROBIOLOGY: Recent Results (from the past 240 hours)  Resp panel by RT-PCR (RSV, Flu A&B, Covid) Anterior Nasal Swab     Status: None   Collection Time: 12/29/23  3:37 PM   Specimen: Anterior Nasal Swab  Result Value Ref Range Status   SARS Coronavirus 2 by RT PCR NEGATIVE NEGATIVE Final   Influenza A by PCR NEGATIVE NEGATIVE Final   Influenza B by PCR NEGATIVE NEGATIVE Final    Comment: (NOTE) The Xpert Xpress SARS-CoV-2/FLU/RSV plus assay is intended as an aid in the diagnosis of influenza from Nasopharyngeal swab specimens and should not be used as a sole basis for treatment. Nasal washings and aspirates are unacceptable for Xpert Xpress SARS-CoV-2/FLU/RSV testing.  Fact Sheet for Patients: BloggerCourse.com  Fact Sheet for Healthcare Providers: SeriousBroker.it  This test is not yet approved or cleared by the [place]  FDA and has been authorized for detection and/or diagnosis of SARS-CoV-2 by FDA under an Emergency Use Authorization (EUA). This EUA will remain in effect (meaning this test can be used) for the duration of the COVID-19 declaration under Section 564(b)(1) of the Act, 21  U.S.C. section 360bbb-3(b)(1), unless the authorization is terminated or revoked.     Resp Syncytial Virus by PCR NEGATIVE NEGATIVE Final    Comment: (NOTE) Fact Sheet for Patients: BloggerCourse.com  Fact Sheet for Healthcare Providers: SeriousBroker.it  This test is not yet approved or cleared by the [place]  FDA and has been authorized for detection and/or diagnosis of SARS-CoV-2 by FDA under an Emergency Use Authorization (EUA). This EUA will remain  in effect (meaning this test can be used) for the duration of the COVID-19 declaration under Section 564(b)(1) of the Act, 21 U.S.C. section 360bbb-3(b)(1), unless the authorization is terminated or revoked.  Performed at [facility], [address] [place], [place] [address]   Urine Culture     Status: None   Collection Time: 12/29/23 10:16 PM   Specimen: Urine, Clean Catch  Result Value Ref Range Status   Specimen Description URINE, CLEAN CATCH  Final   Special Requests NONE  Final   Culture   Final    NO GROWTH Performed at [facility], [address] [place], [place] [address]    Report Status 12/30/2023 FINAL  Final  MRSA Next Gen by PCR, Nasal     Status: None   Collection Time: 12/30/23  2:00 AM   Specimen: Nasal Mucosa; Nasal Swab  Result Value Ref Range Status   MRSA by PCR Next Gen NOT DETECTED NOT DETECTED Final    Comment: (NOTE) The GeneXpert MRSA Assay (FDA approved for NASAL specimens only), is one component of a comprehensive MRSA colonization surveillance program. It is not intended to diagnose MRSA infection nor to guide or monitor treatment for MRSA infections. Test performance is not FDA approved in patients less than 27 years old. Performed at [facility], [address] [place], [place] [address]   Gram stain     Status: None   Collection Time: 12/30/23 11:42 AM   Specimen: Abdomen; Peritoneal Fluid  Result Value Ref  Range Status   Specimen Description ABDOMEN  Final   Special Requests PERITONEAL  Final   Gram Stain   Final    NO WBC SEEN NO ORGANISMS SEEN Performed at [facility], 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 If you do not have the password, please call the hospital operator.  01/03/2024, 9:18 AM

## 2024-01-03 NOTE — Telephone Encounter (Signed)
 Pts daughter calling to tell office she had a cath performed yesterday 10/13. She would like more information and understanding of pts care and procedures. Please advise.

## 2024-01-03 NOTE — Progress Notes (Signed)
 Chelsea Boyer is requesting to speak to Cardiology regarding his wife care phone number [phone].

## 2024-01-03 NOTE — Progress Notes (Signed)
 Nutrition Follow-up  DOCUMENTATION CODES:   Severe malnutrition in context of chronic illness  INTERVENTION:  Continue regular diet as ordered for most liberal menu options Add Ensure Plus High Protein po BID, each supplement provides 350 kcal and 20 grams of protein. 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 - diagnosis updated 10/15  GOAL:  Patient will meet greater than or equal to 90% of their needs - goal unmet  MONITOR:  PO intake, Supplement acceptance, Weight trends, Labs  REASON FOR ASSESSMENT:  Consult Assessment of nutrition requirement/status  ASSESSMENT:  Pt with hx of NASH liver cirrhosis, chronic heart failure, atrial fibrillation, and TAVR. 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  10/9 - admitted 10/10 - underwent paracentesis, 10L hazy dark fluid from R abdomen removed 10/13 - RHC- severe pulmonary hypertension with elevated wedge 10/14 -  poor prognosis, palliative consulted for GOC (continue current scope of care, DNR)  Spoke with pt at bedside. She is comfortably laying in bed.  She states that she has had a very poor appetite that has been ongoing. She has not tried the nutrition supplements on her meal try.  She does not report having any early satiety, just more or less does not have any appetite.    Spoke with RN who reports that she did not eat breakfast. Lunch tray on bedside table with cottage cheese, ice cream, magic cup, fruit and mighty shakes. All untouched.    She states that her bowels have been somewhat irregular. She reports that she may feel like she needs to go but then gets up and does not have a BM.   Could consider Cortrak placement for supplements  nutrition support in the short term however pt is a poor candidate for long term nutrition support via PEG.   Limited meal completions on file to review.  10/14: 10% x breakfast and lunch 10/15: 5% breakfast  Admit weight: 63.5 kg Current weight: 62.4 kg  Medications: lasix 80mg  BID, lactulose 20g BID, aldactone , warfarin  Labs:   Sodium 124 BUN 38 Cr 1.71 GFR 30  NUTRITION - FOCUSED PHYSICAL EXAM: Flowsheet Row Most Recent Value  Orbital Region Severe depletion  Upper Arm Region Severe depletion  Thoracic and Lumbar Region Severe depletion  Buccal Region Moderate depletion  Temple Region Severe depletion  Clavicle Bone Region Severe depletion  Clavicle and Acromion Bone Region Severe depletion  Scapular Bone Region Severe depletion  Dorsal Hand Severe depletion  Patellar Region Severe depletion  Anterior Thigh Region Severe depletion  Posterior Calf Region Severe depletion  Edema (RD Assessment) Mild  Hair Reviewed  Eyes Reviewed  Mouth Reviewed  Skin Reviewed  Nails Reviewed    Diet Order:   Diet Order             Diet regular Room service appropriate? Yes; Fluid consistency: Thin  Diet effective now                   EDUCATION NEEDS:  No education needs have been identified at this time  Skin:  Skin Assessment: Reviewed RN Assessment  Last BM:  10/14 type 6 large  Height:  Ht Readings from Last 1 Encounters:  01/02/24 5' 1 (1.549 m)  Weight:  Wt Readings from Last 1 Encounters:  01/04/24 62.4 kg    Ideal Body Weight:  47.7 kg  BMI:  Body mass index is 25.99 kg/m.  Estimated Nutritional Needs:   Kcal:  1600-1800  Protein:  70-90g  Fluid:  1.6-1.8L  Royce Maris, RDN, LDN Clinical Nutrition See AMiON for contact information.

## 2024-01-03 NOTE — Progress Notes (Signed)
 PHARMACY - ANTICOAGULATION CONSULT NOTE  Pharmacy Consult for heparin Indication: MVR  Labs: Recent Labs    01/01/24 0527 01/01/24 1101 01/02/24 0315 01/02/24 1421 01/02/24 1425 01/03/24 0240 01/03/24 1013 01/03/24 2154 01/03/24 2220  HGB 10.5*  --  10.3* 11.2* 11.2* 10.1*  --   --   --   HCT 31.3*  --  30.5* 33.0* 33.0* 30.2*  --   --   --   PLT 147*  --  124*  --   --  112*  --   --   --   LABPROT 23.5*  --  23.0*  --   --   --  20.7*  --   --   INR 2.0*  --  1.9*  --   --   --  1.7*  --   --   HEPARINUNFRC  --    < > 0.37  --   --  <0.10* 0.12* 0.36 0.30  CREATININE 1.39*  --  1.43*  --   --  1.58*  --   --   --    < > = values in this interval not displayed.   Assessment: 78yo female therapeutic on heparin after rate change but at very low end of goal; no infusion issues or signs of bleeding per RN.  Goal of Therapy:  Heparin level 0.3-0.7 units/ml   Increase heparin infusion slightly to 1200 units/hr. Check level in 8 hours.   Marvetta Dauphin, PharmD, BCPS 01/03/2024 11:30 PM

## 2024-01-03 NOTE — Plan of Care (Signed)
  Problem: Education: Goal: Knowledge of General Education information will improve Description: Including pain rating scale, medication(s)/side effects and non-pharmacologic comfort measures Outcome: Progressing   Problem: Clinical Measurements: Goal: Cardiovascular complication will be avoided Outcome: Progressing   Problem: Clinical Measurements: Goal: Will remain free from infection Outcome: Progressing   Problem: Nutrition: Goal: Adequate nutrition will be maintained Outcome: Progressing   Problem: Elimination: Goal: Will not experience complications related to urinary retention Outcome: Progressing   Problem: Pain Managment: Goal: General experience of comfort will improve and/or be controlled Outcome: Progressing   Problem: Skin Integrity: Goal: Risk for impaired skin integrity will decrease Outcome: Progressing   Problem: Safety: Goal: Ability to remain free from injury will improve Outcome: Progressing   Problem: Education: Goal: Ability to demonstrate management of disease process will improve Outcome: Progressing

## 2024-01-03 NOTE — Progress Notes (Signed)
 PHARMACY - ANTICOAGULATION CONSULT NOTE  Pharmacy Consult for Heparin Indication: mechanical mitral valve and subtherapeutic INR on warfarin  No Known Allergies  Patient Measurements: Height: 5' 1 (154.9 cm) Weight: 60.9 kg (134 lb 4.2 oz) IBW/kg (Calculated) : 47.8 HEPARIN DW (KG): 60.3  Vital Signs: Temp: 98.1 F (36.7 C) (10/14 1106) Temp Source: Oral (10/14 1106) BP: 117/53 (10/14 1106) Pulse Rate: 80 (10/14 1106)  Labs: Recent Labs    01/01/24 0527 01/01/24 1101 01/02/24 0315 01/03/24 0240 01/03/24 1013  HGB 10.5*  --  10.3* 10.1*  --   HCT 31.3*  --  30.5* 30.2*  --   PLT 147*  --  124* 112*  --   LABPROT 23.5*  --  23.0*  --  20.7*  INR 2.0*  --  1.9*  --  1.7*  HEPARINUNFRC  --    < > 0.37 <0.10* 0.12*  CREATININE 1.39*  --  1.43* 1.58*  --    < > = values in this interval not displayed.   Estimated Creatinine Clearance: 24.2 mL/min (A) (by C-G formula based on SCr of 1.58 mg/dL (H)).  Infusions:   heparin 950 Units/hr (01/03/24 0303)    Assessment: Patient is a 78 YO F presenting with abdominal distension, peripheral edema, and fatigue. 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 According to last anticoag clinic note on 9/25, warfarin regimen was one-half 5 mg tablet daily and INR was 3 at that time. Warfarin on hold   Now s/p cath 10/13 and heparin resumed post cath and warfarin restarted -Heparin level= 0.12 on 950 units/hr (noted at goal prior to cath) -INR= 1.7   Goal of Therapy:  INR goal 2.5-3.5 Heparin level 0.3-0.7 units/ml Monitor platelets by anticoagulation protocol: Yes   Plan:  -Increase heparin to 1100 units/hr -heparin level in 8 hrs -Warfarin 5mg  po today -Daily PT/INR  Prentice Poisson, PharmD Clinical Pharmacist **Pharmacist phone directory can now be found on [email or web address] ([email or web address]).  Listed under [facility].

## 2024-01-03 NOTE — Progress Notes (Signed)
 Physical Therapy Treatment Patient Details Name: Chelsea Boyer MRN: [id number] DOB: 04-01-1945 Today's Date: 01/03/2024   History of Present Illness 78 y.o F adm 12/29/23 for hyponatremia and volume overload related to cirrhosis. 10/13 radial R/LHC. PMH: cirrhosis, CHF, CKD, hypothyroidism, Afib TAVR    PT Comments  Pt pleasant with spouse and daughter present. Rt hip pain 5/10 with improved mobility this date with youth RW and educated for seated HEp and post cath radial restrictions. Encouraged OOB and mobility daily as well as continued HEP and RW use. HHPT appropriate.    If plan is discharge home, recommend the following: A little help with walking and/or transfers;A little help with bathing/dressing/bathroom;Assistance with cooking/housework;Assist for transportation;Help with stairs or ramp for entrance   Can travel by private vehicle        Equipment Recommendations  Rolling walker (2 wheels);BSC/3in1 (youth RW)    Recommendations for Other Services       Precautions / Restrictions Precautions Precautions: Fall Recall of Precautions/Restrictions: Intact Precaution/Restrictions Comments: watch BP; Rt radial cath 10/13     Mobility  Bed Mobility Overal bed mobility: Needs Assistance Bed Mobility: Supine to Sit     Supine to sit: HOB elevated, Min assist     General bed mobility comments: min assist to clear RLE and elevate trunk from surface with HOB 20 degrees, increased time, cues for sequence    Transfers Overall transfer level: Needs assistance   Transfers: Sit to/from Stand Sit to Stand: Contact guard assist           General transfer comment: Cues for hand placement. UB required to power up and stabilize, 5 repeated sit to stands from recliner    Ambulation/Gait Ambulation/Gait assistance: Contact guard assist Gait Distance (Feet): 120 Feet Assistive device: Rolling walker (2 wheels) Gait Pattern/deviations: Step-to pattern, Decreased stance time  - right   Gait velocity interpretation: 1.31 - 2.62 ft/sec, indicative of limited community ambulator   General Gait Details: cues for sequence, use of RW and safety, reliant on RW to off weight RLE with progression from step to pattern to slightly step through sequence   Stairs Stairs:  (pt denied attempting this date)           Wheelchair Mobility     Tilt Bed    Modified Rankin (Stroke Patients Only)       Balance Overall balance assessment: Needs assistance Sitting-balance support: No upper extremity supported, Feet supported Sitting balance-Leahy Scale: Good Sitting balance - Comments: EOB without support   Standing balance support: Bilateral upper extremity supported, During functional activity, Reliant on assistive device for balance Standing balance-Leahy Scale: Poor Standing balance comment: reliant on RW                            Communication Communication Communication: No apparent difficulties  Cognition Arousal: Alert Behavior During Therapy: WFL for tasks assessed/performed   PT - Cognitive impairments: No apparent impairments                         Following commands: Intact      Cueing Cueing Techniques: Verbal cues  Exercises General Exercises - Lower Extremity Long Arc Quad: AROM, Both, Seated, Strengthening, 15 reps Hip Flexion/Marching: Both, AAROM, Seated, Strengthening, 15 reps    General Comments        Pertinent Vitals/Pain Pain Assessment Pain Score: 5  Pain Location: Rt hip Pain Descriptors /  Indicators: Discomfort, Sore Pain Intervention(s): Limited activity within patient's tolerance, Monitored during session, Repositioned    Home Living                          Prior Function            PT Goals (current goals can now be found in the care plan section) Progress towards PT goals: Progressing toward goals    Frequency    Min 2X/week      PT Plan      Co-evaluation               AM-PAC PT 6 Clicks Mobility   Outcome Measure  Help needed turning from your back to your side while in a flat bed without using bedrails?: A Little Help needed moving from lying on your back to sitting on the side of a flat bed without using bedrails?: A Little Help needed moving to and from a bed to a chair (including a wheelchair)?: A Little Help needed standing up from a chair using your arms (e.g., wheelchair or bedside chair)?: A Little Help needed to walk in hospital room?: A Little Help needed climbing 3-5 steps with a railing? : A Lot 6 Click Score: 17    End of Session   Activity Tolerance: Patient tolerated treatment well Patient left: in chair;with call bell/phone within reach;with family/visitor present Nurse Communication: Mobility status PT Visit Diagnosis: Unsteadiness on feet (R26.81);Other abnormalities of gait and mobility (R26.89);Muscle weakness (generalized) (M62.81);Difficulty in walking, not elsewhere classified (R26.2)     Time: 8892-8871 PT Time Calculation (min) (ACUTE ONLY): 21 min  Charges:    $Gait Training: 8-22 mins PT General Charges $$ ACUTE PT VISIT: 1 Visit                     Chelsea Boyer, PT Acute Rehabilitation Services Office: [phone]    Chelsea NOVAK Che Rachal 01/03/2024, 12:43 PM

## 2024-01-03 NOTE — Telephone Encounter (Signed)
 S/w [name]- she reports that her mother is in the hospital at this time and there have been different providers in and out. She cannot get a clear answer about what is going on and what the plan of care is.   Recommended that when she goes back to the hospital, she ask to speak with the provider about the plan of care. She asked if maybe Dr Francyne could explain things to her. Asked her to start with the providers that are in the hospital, discuss plan of care with them.   Informed that I will send this information to Dr Francyne to review. She verbalized understanding.

## 2024-01-03 NOTE — Progress Notes (Signed)
 Progress Note  Patient Name: Chelsea Boyer Date of Encounter: 01/03/2024  Primary Cardiologist:   Jerel Balding, MD   Subjective   BP 106/53.  Incomplete I's/O's.  Creatinine mildly increased (1.43 > 1.58), sodium 126.  She denies any chest pain or dyspnea at this morning   Inpatient Medications    Scheduled Meds:  fluticasone  1 spray Each Nare Daily   furosemide  80 mg Intravenous BID   lactulose  20 g Oral BID   levothyroxine  50 mcg Intravenous Daily   metoprolol  succinate  25 mg Oral Daily   midodrine  5 mg Oral TID WC   ondansetron (ZOFRAN) IV  4 mg Intravenous Once   rifaximin  550 mg Oral BID   sodium chloride flush  3 mL Intravenous Q12H   spironolactone   25 mg Oral Daily   Warfarin - Pharmacist Dosing Inpatient   Does not apply q1600   Continuous Infusions:  heparin 950 Units/hr (01/03/24 0303)   PRN Meds: HYDROcodone-acetaminophen, ondansetron **OR** ondansetron (ZOFRAN) IV, sodium chloride flush   Vital Signs    Vitals:   01/03/24 0359 01/03/24 0434 01/03/24 0726 01/03/24 0920  BP: (!) 128/53  (!) 110/49 (!) 106/53  Pulse: 81  77 79  Resp: 18  18   Temp: 98.2 F (36.8 C)  97.9 F (36.6 C)   TempSrc: Oral  Oral   SpO2: 96%  96%   Weight:  60.9 kg    Height:        Intake/Output Summary (Last 24 hours) at 01/03/2024 1017 Last data filed at 01/03/2024 0900 Gross per 24 hour  Intake 478 ml  Output 500 ml  Net -22 ml   Filed Weights   01/01/24 1114 01/02/24 1712 01/03/24 0434  Weight: 61.6 kg 61.6 kg 60.9 kg    Telemetry    Atrial fib with controlled rate - Personally Reviewed  ECG    NA - Personally Reviewed  Physical Exam   GEN: No  acute distress.   Neck:  Mildly elevated JVD Cardiac: irregular  3/6 systolic murmur Respiratory: Clear  to auscultation bilaterally. GI: Soft, nontender, distended, normal bowel sounds  MS:  Mild edema; No deformity. Neuro:   Nonfocal  Psych: Oriented and appropriate    Labs     Chemistry Recent Labs  Lab 12/30/23 0639 12/31/23 0525 01/01/24 0527 01/02/24 0315 01/03/24 0240  NA 125* 127* 126* 127* 126*  K 4.7 5.0 4.2 3.9 4.1  CL 93* 93* 95* 94* 92*  CO2 20* 21* 22 21* 22  GLUCOSE 65* 96 109* 95 102*  BUN 28* 30* 29* 30* 34*  CREATININE 1.38* 1.47* 1.39* 1.43* 1.58*  CALCIUM 8.5* 8.9 8.5* 8.4* 8.2*  PROT 7.4 7.2 6.7  --   --   ALBUMIN 2.6* 3.2* 2.8*  --   --   AST 29 25 27   --   --   ALT 9 8 8   --   --   ALKPHOS 60 52 51  --   --   BILITOT 2.1* 2.6* 2.5*  --   --   GFRNONAA 39* 36* 39* 37* 33*  ANIONGAP 12 13 9 12 12      Hematology Recent Labs  Lab 01/01/24 0527 01/02/24 0315 01/03/24 0240  WBC 7.6 8.7 7.7  RBC 3.92 3.84* 3.75*  HGB 10.5* 10.3* 10.1*  HCT 31.3* 30.5* 30.2*  MCV 79.8* 79.4* 80.5  MCH 26.8 26.8 26.9  MCHC 33.5 33.8 33.4  RDW 18.3*  18.2* 18.7*  PLT 147* 124* 112*    Cardiac EnzymesNo results for input(s): TROPONINI in the last 168 hours. No results for input(s): TROPIPOC in the last 168 hours.   BNP Recent Labs  Lab 12/29/23 1537  BNP 639.2*     DDimer No results for input(s): DDIMER in the last 168 hours.   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   Echocardiogram shows EF 55 to 60%, moderately reduced RV function with severe enlargement, severely elevated pulmonary pressures (RVSP 77 mmHg), severe biatrial enlargement, mechanical mitral valve with mean gradient 8 mmHg, severe tricuspid regurgitation, TAVR valve with moderate to severe aortic stenosis with mean gradient 33 mmHg.   - RHC on 10/13 showed RA 16, RV 80/1, PA 80/31/47, PCWP 20, CI 3.4, PVR 5 Wood units.  Has severe pre and post capillary PH.  Given elevated PVR, likely prohibitive for valve in valve TAVR - Continue diuresis with IV lasix - Unfortunately with her severe PH and right heart failure, in addition to her cirrhosis, and not a good candidate for valve in valve TAVR and unlikely would be very beneficial as appears predominantly precapillary pulmonary hypertension, prognosis is poor.  Recommend palliative evaluation.  D/w with her cardiologist, Dr Francyne   Mechanical mitral valve:  Stable valve.  Continue heparin.  Can restart warfarin as no further procedures planned    CKD IIIB:  Creat 1.58, increased from 1.43 yesterday, will monitor with diuresis   TAVR: Now with moderate to severe aortic stenosis, RHC suggests likely prohibitive for valve in valve TAVR as above  Chronic atrial fib:  On toprol  XL 25 mg daily. Rate is controlled.   Continue IV heparin, can restart warfarin  For questions or updates,  please contact [facility] Please consult [email or web address] for contact info under Cardiology/STEMI.   Signed, Lonni LITTIE Nanas, MD  01/03/2024, 10:17 AM

## 2024-01-03 NOTE — Consult Note (Signed)
 Consultation Note Date: 01/03/2024   Patient Name: Chelsea Boyer  DOB: 08/17/1945  MRN: [id number]  Age / Sex: 78 y.o., female  PCP: Chrystal Lamarr RAMAN, MD Referring Physician: Raenelle Donalda HERO, MD  Reason for Consultation: Establishing goals of care  HPI/Patient Profile: 78 y.o. female  with past medical history of NASH small esophageal varices , A.fib on coumadin  , , hx of mitral valve replacement on coumadin  , CKD, hypothyrodism, anemia, sp TAVR  admitted on 12/29/2023 with abdominal distention, increased confusion fatigued, increased leg and abd swelling.  Worth to note that patient has a history of TAVR and now presents with moderate to severe stenosis of the prosthetic valve, likely worsening right ventricular failure. A right heart catheterization on 10/13 showed pulmonary vascular resistance too high for repeat TAVR.   PMT has been consulted to assist with goals of care conversation. Patient/Family face treatment option decisions, advanced directive decisions and anticipatory care needs.   Family face treatment option decision, advance directive decisions and anticipatory care needs.   Clinical Assessment and Goals of Care:  I have reviewed medical records including EPIC notes, labs and imaging, assessed the patient and then met with patient and her husband Chelsea Boyer to discuss diagnosis prognosis, GOC, EOL wishes, disposition and options.  I introduced Palliative Medicine as specialized medical care for people living with serious illness. It focuses on providing relief from the symptoms and stress of a serious illness. The goal is to improve quality of life for both the patient and the family.    During our conversation, I assessed the patient's understanding of the current clinical situation and reviewed available treatment options. We also discussed goals of care, including the patient's values, wishes, and preferences regarding treatment and  quality of life. I emphasized the importance of ensuring that the care provided aligns with the patient's goals and encouraged ongoing open communication as the clinical situation evolves.  The patient and husband demonstrated a clear understanding of her acute illness, chronic medical conditions, and the reasons for her current hospitalization. Her husband shared that he began noticing a gradual decline in her functional status over the summer, including increased fatigue, low energy, reduced interest in activities she previously enjoyed, and a decrease in appetite. We discussed her current acute medical issues, including acute on chronic heart failure with preserved ejection fraction (HFpEF), right ventricular failure, and pulmonary hypertension, all contributing to her anasarca. 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  Given the patient's significant disease burden, we discussed the likelihood of ongoing health challenges and a continued decline, especially in light of limited medical options. Both the patient and her husband demonstrated understanding and acknowledged the situation. The husband noted that the cardiologist had informed them that the patient is not a candidate for any invasive cardiac procedures due to her current condition. I provided a detailed explanation of the current treatment plan and the rationale behind each intervention to support informed decision-making.  I discussed and reviewed the patient's goals of care, including her current full code status. I encouraged the patient and her family to consider transitioning to DNR/DNI, given the evidence-based poor outcomes in similar hospitalized patients. I explained that in cases like hers, where arrest is likely due to advanced chronic or terminal illness rather than a  reversible acute event, DNR/DNI serves as a protective measure to prevent harm during  the final moments of life. I clarified that this status does not alter the current medical plan and only applies if the patient experiences cardiac or respiratory arrest.  Both the patient and her husband expressed that a DNR with a limited scope of care, treating what is treatable while hoping for improvement seems reasonable given her condition. However, they requested more time to consider the decision and to discuss it with their daughter. I offered to contact their daughter to include her in the conversation, and they preferred this be done tomorrow, as she may not be available today.    Created space and opportunity for family to explore thoughts and feelings regarding patient's current medical condition.   The difference between aggressive medical intervention and comfort care was considered in light of the patient's goals of care.  Discussed the importance of continued conversation with family and the medical providers regarding overall plan of care and treatment options, ensuring decisions are within the context of the patient's values and GOCs.   Questions and concerns were addressed.  Hard Choices booklet left for review. The family was encouraged to call with questions or concerns.  PMT will continue to support holistically.   Social History: Social History: The patient lives with her husband, with whom she has been married for 40 years. They relocated from [place]  four years ago and have one daughter who lives nearby. She previously worked as an Engineer, structural at a detention center. She enjoys reading mystery novels, spending time with her grandchildren, and being around people.  Functional and Nutritional Status At baseline, the patient is ambulatory without the use of assistive devices. However, in recent weeks, she has experienced a noticeable decline in mobility, including unsteady gait  and balance issues. Her appetite has also been suboptimal.   Palliative Symptoms: Generalized weakness, decrease appetite, confusion, shortness of breath  Advance Directives: Patient does not currently have advance directive documents. I encourage patient to create one in the context of her advance illness. I offered that we can help creating AD through our chaplain services, to which they verbalized appreciation.    Code Status: Full Code  Primary Decision Maker: Patient  Husband: Chelsea Boyer    SUMMARY OF RECOMMENDATIONS   Recommendations/Plan:  Code Status: Maintain Full Code. Patient is considering changing code status to DNR-Limited, pending further discussion with family.  Continue current scope of care Goal of care is medical stabilization and recovery to the extent this is possible Spiritual care consult to assist with advance directive creation Continue to provide psycho-social and emotional support to patient and family Palliative medicine team will continue to follow.   Symptom Management: Per Primary team Palliative medicine is available to assist as needed.    Palliative Prophylaxis:  Aspiration, Delirium Protocol, Eye Care, Oral Care, Palliative Wound Care, and Turn Reposition   Psycho-social/Spiritual:  Desire for further Chaplaincy support:yes  Prognosis:  Given the patient's substantial underlying disease burden, the overall prognosis remains poor.   Discharge Planning: To Be Determined      Primary Diagnoses: Present on Admission:  Hyponatremia  Acute venous embolism and thrombosis of deep vessels of proximal lower extremity (HCC)  Anemia  Atrial fibrillation (HCC)  Decompensated cirrhosis (HCC)  Other ascites  Cellulitis of right leg  Physical deconditioning  Hypothyroidism  CKD (chronic kidney disease), stage III (HCC)  Confusion  Acute on chronic diastolic CHF (congestive heart failure) (HCC)  Hyperkalemia    Exam: Gen Exam:Alert  awake-not in any distress, generalized weakness HEENT:  atraumatic, normocephalic Chest: B/L clear to auscultation anteriorly CVS:HR 79 Abdomen:distended Extremities:no edema Neurology: Non focal Skin: no rash  Vital Signs: BP (!) 106/53   Pulse 79   Temp 97.9 F (36.6 C) (Oral)   Resp 18   Ht 5' 1 (1.549 m)   Wt 60.9 kg   SpO2 96%   BMI 25.37 kg/m  Pain Scale: 0-10 POSS *See Group Information*: 1-Acceptable,Awake and alert Pain Score: 3    SpO2: SpO2: 96 % O2 Device:SpO2: 96 % O2 Flow Rate: .O2 Flow Rate (L/min): 2 L/min   Palliative Assessment/Data:    Total time: I spent 90 minutes in the care of the patient today in the above activities and documenting the encounter.   Detailed review of medical records (labs, imaging, vital signs), medically appropriate exam, discussed with treatment team, counseling and education to patient, family, & staff, documenting clinical information, coordination of care.     Kathlyne JULIANNA Tracie Mickey, NP  Palliative Medicine Team Team phone # [phone]  Thank you for allowing the Palliative Medicine Team to assist in the care of this patient. Please utilize secure chat with additional questions, if there is no response within 30 minutes please call the above phone number.  Palliative Medicine Team providers are available by phone from 7am to 7pm daily and can be reached through the team cell phone.  Should this patient require assistance outside of these hours, please call the patient's attending physician.

## 2024-01-04 DIAGNOSIS — I50810 Right heart failure, unspecified: Secondary | ICD-10-CM | POA: Diagnosis not present

## 2024-01-04 DIAGNOSIS — R7989 Other specified abnormal findings of blood chemistry: Secondary | ICD-10-CM | POA: Diagnosis not present

## 2024-01-04 DIAGNOSIS — E871 Hypo-osmolality and hyponatremia: Secondary | ICD-10-CM | POA: Diagnosis not present

## 2024-01-04 DIAGNOSIS — T82857A Stenosis of cardiac prosthetic devices, implants and grafts, initial encounter: Secondary | ICD-10-CM | POA: Diagnosis not present

## 2024-01-04 DIAGNOSIS — Z515 Encounter for palliative care: Secondary | ICD-10-CM | POA: Diagnosis not present

## 2024-01-04 DIAGNOSIS — I5033 Acute on chronic diastolic (congestive) heart failure: Secondary | ICD-10-CM | POA: Diagnosis not present

## 2024-01-04 LAB — BASIC METABOLIC PANEL WITH GFR
Anion gap: 12 (ref 5–15)
BUN: 38 mg/dL — ABNORMAL HIGH (ref 8–23)
CO2: 24 mmol/L (ref 22–32)
Calcium: 8.6 mg/dL — ABNORMAL LOW (ref 8.9–10.3)
Chloride: 88 mmol/L — ABNORMAL LOW (ref 98–111)
Creatinine, Ser: 1.71 mg/dL — ABNORMAL HIGH (ref 0.44–1.00)
GFR, Estimated: 30 mL/min — ABNORMAL LOW (ref >60)
Glucose, Bld: 91 mg/dL (ref 70–99)
Potassium: 4.3 mmol/L (ref 3.5–5.1)
Sodium: 124 mmol/L — ABNORMAL LOW (ref 135–145)

## 2024-01-04 LAB — CBC
HCT: 31.7 % — ABNORMAL LOW (ref 36.0–46.0)
Hemoglobin: 10.8 g/dL — ABNORMAL LOW (ref 12.0–15.0)
MCH: 27.1 pg (ref 26.0–34.0)
MCHC: 34.1 g/dL (ref 30.0–36.0)
MCV: 79.4 fL — ABNORMAL LOW (ref 80.0–100.0)
Platelets: 113 K/uL — ABNORMAL LOW (ref 150–400)
RBC: 3.99 MIL/uL (ref 3.87–5.11)
RDW: 18.6 % — ABNORMAL HIGH (ref 11.5–15.5)
WBC: 7.6 K/uL (ref 4.0–10.5)
nRBC: 0 % (ref 0.0–0.2)

## 2024-01-04 LAB — MAGNESIUM: Magnesium: 2.2 mg/dL (ref 1.7–2.4)

## 2024-01-04 LAB — PROTIME-INR
INR: 1.9 — ABNORMAL HIGH (ref 0.8–1.2)
Prothrombin Time: 22.9 s — ABNORMAL HIGH (ref 11.4–15.2)

## 2024-01-04 LAB — HEPARIN LEVEL (UNFRACTIONATED): Heparin Unfractionated: 0.56 [IU]/mL (ref 0.30–0.70)

## 2024-01-04 MED ORDER — WARFARIN SODIUM 5 MG PO TABS
5.0000 mg | ORAL_TABLET | Freq: Once | ORAL | Status: AC
  Administered 2024-01-04: 5 mg via ORAL
  Filled 2024-01-04: qty 1

## 2024-01-04 MED ORDER — ENSURE PLUS HIGH PROTEIN PO LIQD
237.0000 mL | Freq: Two times a day (BID) | ORAL | Status: DC
  Administered 2024-01-05 – 2024-01-16 (×13): 237 mL via ORAL

## 2024-01-04 NOTE — Progress Notes (Signed)
 PHARMACY - ANTICOAGULATION CONSULT NOTE  Pharmacy Consult for Heparin Indication: mechanical mitral valve and subtherapeutic INR on warfarin  No Known Allergies  Patient Measurements: Height: 5' 1 (154.9 cm) Weight: 62.4 kg (137 lb 9.1 oz) IBW/kg (Calculated) : 47.8 HEPARIN DW (KG): 60.3  Vital Signs: Temp: 97.8 F (36.6 C) (10/15 0732) Temp Source: Oral (10/15 0732) BP: 128/54 (10/15 0919) Pulse Rate: 71 (10/15 0919)  Labs: Recent Labs    01/02/24 0315 01/02/24 1421 01/02/24 1425 01/03/24 0240 01/03/24 1013 01/03/24 2154 01/03/24 2220 01/04/24 0759  HGB 10.3*   < > 11.2* 10.1*  --   --   --  10.8*  HCT 30.5*   < > 33.0* 30.2*  --   --   --  31.7*  PLT 124*  --   --  112*  --   --   --  113*  LABPROT 23.0*  --   --   --  20.7*  --   --  22.9*  INR 1.9*  --   --   --  1.7*  --   --  1.9*  HEPARINUNFRC 0.37  --   --  <0.10* 0.12* 0.36 0.30 0.56  CREATININE 1.43*  --   --  1.58*  --   --   --  1.71*   < > = values in this interval not displayed.   Estimated Creatinine Clearance: 22.6 mL/min (A) (by C-G formula based on SCr of 1.71 mg/dL (H)).  Infusions:   heparin 1,200 Units/hr (01/04/24 0306)    Assessment: Patient is a 78 YO F presenting with abdominal distension, peripheral edema, and fatigue. 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 According to last anticoag clinic note on 9/25, warfarin regimen was one-half 5 mg tablet daily and INR was 3 at that time. Warfarin on hold   Now s/p cath 10/13 and heparin resumed post cath and warfarin restarted -Heparin level= 0.56 on 1200 units/hr  -INR= 1.7> 1.9   Goal of Therapy:  INR goal 2.5-3.5 Heparin level 0.3-0.7 units/ml Monitor platelets by anticoagulation protocol: Yes   Plan:  -Continue heparin to 1200 units/hr -daily HL and CBC -Warfarin 5mg  po today -Daily PT/INR  Prentice Poisson, PharmD Clinical Pharmacist **Pharmacist phone directory can  now be found on [email or web address] ([email or web address]).  Listed under [facility].

## 2024-01-04 NOTE — Plan of Care (Signed)
  Problem: Activity: Goal: Risk for activity intolerance will decrease Outcome: Progressing   Problem: Nutrition: Goal: Adequate nutrition will be maintained Outcome: Progressing   Problem: Coping: Goal: Level of anxiety will decrease Outcome: Progressing   Problem: Safety: Goal: Ability to remain free from injury will improve Outcome: Progressing   Problem: Clinical Measurements: Goal: Ability to maintain clinical measurements within normal limits will improve Outcome: Progressing Goal: Will remain free from infection Outcome: Progressing Goal: Diagnostic test results will improve Outcome: Progressing Goal: Respiratory complications will improve Outcome: Progressing Goal: Cardiovascular complication will be avoided Outcome: Progressing

## 2024-01-04 NOTE — Progress Notes (Addendum)
 PROGRESS NOTE        PATIENT DETAILS Name: Chelsea Boyer Age: 78 y.o. Sex: female Date of Birth: 1946-03-06 Admit Date: 12/29/2023 Admitting Physician Sigurd Pac, MD ERE:[email or web address], Lamarr RAMAN, MD  Brief Summary: Patient is a 78 y.o.  female with history of Hollie liver cirrhosis, chronic HFpEF/RV failure, A-fib on Coumadin , mechanical mitral valve replacement, TAVR-who presented with anasarca-thought to be secondary to combination of decompensated liver cirrhosis and RV failure. - Admitted, started on high-dose diuretics, underwent paracentesis -Echo noted TAVR with moderate to severe aortic stenosis, moderately reduced RV, severely elevated PA pressures, - 10/13 right heart cath with significantly elevated PA pressures and wedge - 10/14: Poor prognosis, palliative care consulted  Significant studies: 10/9>> CT head: No acute intracranial abnormality. 10/9>> CT abdomen/pelvis: Cirrhosis-large volume ascites. 10/9>> CXR: No acute abnormality. 10/10>> echo: EF 55-60%, RV systolic function moderately reduced, RVSP 76.8.  Moderate to severe aortic valve stenosis.  Procedures: 10/10>> paracentesis (WBC 105,8% neutrophils). 10/13>> RHC (RV 80/31, PCW P: 20 mmHg)  Subjective: Feels fair, no events overnight  Objective: Vitals: Blood pressure (!) 128/54, pulse 71, temperature 97.8 F (36.6 C), temperature source Oral, resp. rate 17, height 5' 1 (1.549 m), weight 62.4 kg, SpO2 98%.   Exam: Gen Exam:Alert awake, chronically ill, AO x 3 HEENT: Positive JVD HEENT:atraumatic, normocephalic Chest: B/L clear to auscultation anteriorly CVS:S1S2 regular Abdomen: Soft, distended, nontender, umbilical hernia Extremities: 1-2+ edema predominantly in thighs Neurology: Non focal Skin: no rashes  Assessment/Plan:  Acute on chronic HFpEF, anasarca, large volume ascites RV failure Pulmonary hypertension -Echo noted EF 55-60%, moderately reduced RV with severe  enlargement, severely elevated PASP, RSVP 77, mechanical mitral valve, severe tricuspid regurgitation, TAVR valve with moderate to severe aortic stenosis -RHC on 10/13 showed RA 16, RV 80/1, PA 80/31/47, PCWP 20, CI 3.4, PVR 5 Wood units. Has severe pre and post capillary PH  -Cards following, volume status improving after diuresis and paracentesis - with severe pulmonary hypertension and right heart failure not felt to be a good candidate for valve in valve TAVR, prognosis felt to be poor, palliative care was recommended yesterday  History of TAVR-now with moderate to severe aortic stenosis of prosthetic valve. Probably exacerbating RV failure S/p RHC on 10/13-PVR prohibitive for TAVR. Palliative care consultation placed on 10/14.  Hyponatremia -Secondary to hypovolemia from above  Decompensated NASH liver cirrhosis Continue diuretics S/p paracentesis 10/10-no evidence of SBP -Continue lactulose rifaximin  Chronic A-fib Rate controlled-metoprolol  Coumadin , IV heparin  History of mechanical mitral valve replacement Coumadin  briefly held for RHC/LHC-remains on IV heparin-Coumadin   Hypothyroidism TSH elevated-19.5 on 10/9-felt to be due to poor absorption of Synthroid through edematous cut Currently on IV Synthroid-switch to oral route closer to discharge.  Normocytic anemia Likely secondary to acute illness superimposed on underlying chronic disease Monitor Hb  Debility/deconditioning PT/OT eval-Home health recommended.  Palliative care/goals of care agreeable to DNR after discussion today discussed with patient regarding complexity of her underlying issues-risk of recurrence-no good long-term solutions.  RHC confirms that patient is not a candidate for repeat TAVR.  Palliative care consultation placed 10/14.  Code status: now DNR  DVT Prophylaxis:Warf/heparin Family Communication: no fam at bedside, called and updated daughter re poor prognosis today Disposition Plan:  TBD   Planned Discharge Destination:Home health   Diet: Diet Order  Diet regular Room service appropriate? Yes; Fluid consistency: Thin  Diet effective now                     Antimicrobial agents: Anti-infectives (From admission, onward)    Start     Dose/Rate Route Frequency Ordered Stop   12/30/23 1030  rifaximin (XIFAXAN) tablet 550 mg        550 mg Oral 2 times daily 12/30/23 1029     12/30/23 0200  doxycycline (VIBRA-TABS) tablet 100 mg  Status:  Discontinued        100 mg Oral Every 12 hours 12/29/23 2347 12/30/23 1244        MEDICATIONS: Scheduled Meds:  fluticasone  1 spray Each Nare Daily   lactulose  20 g Oral BID   levothyroxine  50 mcg Intravenous Daily   metoprolol  succinate  25 mg Oral Daily   midodrine  5 mg Oral TID WC   rifaximin  550 mg Oral BID   sodium chloride flush  3 mL Intravenous Q12H   spironolactone   25 mg Oral Daily   warfarin  5 mg Oral ONCE-1600   Warfarin - Pharmacist Dosing Inpatient   Does not apply q1600   Continuous Infusions:  heparin 1,200 Units/hr (01/04/24 0306)   PRN Meds:.HYDROcodone-acetaminophen, ondansetron **OR** ondansetron (ZOFRAN) IV, sodium chloride flush   I have personally reviewed following labs and imaging studies  LABORATORY DATA: CBC: Recent Labs  Lab 12/29/23 1537 12/29/23 1550 12/31/23 0525 01/01/24 0527 01/02/24 0315 01/02/24 1421 01/02/24 1425 01/03/24 0240 01/04/24 0759  WBC 4.3   < > 5.5 7.6 8.7  --   --  7.7 7.6  NEUTROABS 3.0  --   --   --   --   --   --   --   --   HGB 11.0*   < > 10.4* 10.5* 10.3* 11.2* 11.2* 10.1* 10.8*  HCT 33.5*   < > 31.5* 31.3* 30.5* 33.0* 33.0* 30.2* 31.7*  MCV 81.5   < > 81.0 79.8* 79.4*  --   --  80.5 79.4*  PLT 174   < > 165 147* 124*  --   --  112* 113*   < > = values in this interval not displayed.    Basic Metabolic Panel: Recent Labs  Lab 12/29/23 1537 12/29/23 1550 12/30/23 0639 12/31/23 0525 01/01/24 0527 01/02/24 0315  01/02/24 1421 01/02/24 1425 01/03/24 0240 01/04/24 0759  NA 124*   < > 125* 127* 126* 127* 129* 128* 126* 124*  K 5.5*   < > 4.7 5.0 4.2 3.9 3.9 3.9 4.1 4.3  CL 93*   < > 93* 93* 95* 94*  --   --  92* 88*  CO2 21*   < > 20* 21* 22 21*  --   --  22 24  GLUCOSE 83   < > 65* 96 109* 95  --   --  102* 91  BUN 32*   < > 28* 30* 29* 30*  --   --  34* 38*  CREATININE 1.54*   < > 1.38* 1.47* 1.39* 1.43*  --   --  1.58* 1.71*  CALCIUM 9.2   < > 8.5* 8.9 8.5* 8.4*  --   --  8.2* 8.6*  MG 2.0  --  1.8  --   --  1.6*  --   --  2.5* 2.2  PHOS  --   --  3.4  --   --   --   --   --  3.4  --    < > = values in this interval not displayed.    GFR: Estimated Creatinine Clearance: 22.6 mL/min (A) (by C-G formula based on SCr of 1.71 mg/dL (H)).  Liver Function Tests: Recent Labs  Lab 12/29/23 1537 12/30/23 0639 12/31/23 0525 01/01/24 0527  AST 35 29 25 27   ALT 10 9 8 8   ALKPHOS 74 60 52 51  BILITOT 2.4* 2.1* 2.6* 2.5*  PROT 8.7* 7.4 7.2 6.7  ALBUMIN 3.0* 2.6* 3.2* 2.8*   No results for input(s): LIPASE, AMYLASE in the last 168 hours. Recent Labs  Lab 12/29/23 1537  AMMONIA 15    Coagulation Profile: Recent Labs  Lab 12/31/23 0525 01/01/24 0527 01/02/24 0315 01/03/24 1013 01/04/24 0759  INR 1.3* 2.0* 1.9* 1.7* 1.9*    Cardiac Enzymes: No results for input(s): CKTOTAL, CKMB, CKMBINDEX, TROPONINI in the last 168 hours.  BNP (last 3 results) No results for input(s): PROBNP in the last 8760 hours.  Lipid Profile: No results for input(s): CHOL, HDL, LDLCALC, TRIG, CHOLHDL, LDLDIRECT in the last 72 hours.  Thyroid Function Tests: No results for input(s): TSH, T4TOTAL, FREET4, T3FREE, THYROIDAB in the last 72 hours.   Anemia Panel: No results for input(s): VITAMINB12, FOLATE, FERRITIN, TIBC, IRON, RETICCTPCT in the last 72 hours.   Urine analysis:    Component Value Date/Time   COLORURINE YELLOW 12/29/2023 1900    APPEARANCEUR CLEAR 12/29/2023 1900   LABSPEC 1.009 12/29/2023 1900   PHURINE 5.0 12/29/2023 1900   GLUCOSEU NEGATIVE 12/29/2023 1900   HGBUR NEGATIVE 12/29/2023 1900   BILIRUBINUR NEGATIVE 12/29/2023 1900   KETONESUR NEGATIVE 12/29/2023 1900   PROTEINUR NEGATIVE 12/29/2023 1900   NITRITE NEGATIVE 12/29/2023 1900   LEUKOCYTESUR NEGATIVE 12/29/2023 1900    Sepsis Labs: Lactic Acid, Venous No results found for: LATICACIDVEN  MICROBIOLOGY: Recent Results (from the past 240 hours)  Resp panel by RT-PCR (RSV, Flu A&B, Covid) Anterior Nasal Swab     Status: None   Collection Time: 12/29/23  3:37 PM   Specimen: Anterior Nasal Swab  Result Value Ref Range Status   SARS Coronavirus 2 by RT PCR NEGATIVE NEGATIVE Final   Influenza A by PCR NEGATIVE NEGATIVE Final   Influenza B by PCR NEGATIVE NEGATIVE Final    Comment: (NOTE) The Xpert Xpress SARS-CoV-2/FLU/RSV plus assay is intended as an aid in the diagnosis of influenza from Nasopharyngeal swab specimens and should not be used as a sole basis for treatment. Nasal washings and aspirates are unacceptable for Xpert Xpress SARS-CoV-2/FLU/RSV testing.  Fact Sheet for Patients: BloggerCourse.com  Fact Sheet for Healthcare Providers: SeriousBroker.it  This test is not yet approved or cleared by the [place]  FDA and has been authorized for detection and/or diagnosis of SARS-CoV-2 by FDA under an Emergency Use Authorization (EUA). This EUA will remain in effect (meaning this test can be used) for the duration of the COVID-19 declaration under Section 564(b)(1) of the Act, 21 U.S.C. section 360bbb-3(b)(1), unless the authorization is terminated or revoked.     Resp Syncytial Virus by PCR NEGATIVE NEGATIVE Final    Comment: (NOTE) Fact Sheet for Patients: BloggerCourse.com  Fact Sheet for Healthcare  Providers: SeriousBroker.it  This test is not yet approved or cleared by the [place]  FDA and has been authorized for detection and/or diagnosis of SARS-CoV-2 by FDA under an Emergency Use Authorization (EUA). This EUA will remain in effect (meaning this test can be used) for the duration of the COVID-19 declaration under Section  564(b)(1) of the Act, 21 U.S.C. section 360bbb-3(b)(1), unless the authorization is terminated or revoked.  Performed at [facility], [address], [place], [place] [address]   Urine Culture     Status: None   Collection Time: 12/29/23 10:16 PM   Specimen: Urine, Clean Catch  Result Value Ref Range Status   Specimen Description URINE, CLEAN CATCH  Final   Special Requests NONE  Final   Culture   Final    NO GROWTH Performed at [facility], [address], [place], [place] [address]    Report Status 12/30/2023 FINAL  Final  MRSA Next Gen by PCR, Nasal     Status: None   Collection Time: 12/30/23  2:00 AM   Specimen: Nasal Mucosa; Nasal Swab  Result Value Ref Range Status   MRSA by PCR Next Gen NOT DETECTED NOT DETECTED Final    Comment: (NOTE) The GeneXpert MRSA Assay (FDA approved for NASAL specimens only), is one component of a comprehensive MRSA colonization surveillance program. It is not intended to diagnose MRSA infection nor to guide or monitor treatment for MRSA infections. Test performance is not FDA approved in patients less than 34 years old. Performed at [facility], [address], [place], [place] [address]   Gram stain     Status: None   Collection Time: 12/30/23 11:42 AM   Specimen: Abdomen; Peritoneal Fluid  Result Value Ref Range Status   Specimen Description ABDOMEN  Final   Special Requests PERITONEAL  Final   Gram Stain   Final    NO WBC SEEN NO ORGANISMS SEEN Performed at [facility], 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

## 2024-01-04 NOTE — Progress Notes (Signed)
 Occupational Therapy Treatment Patient Details Name: Chelsea Boyer MRN: [id number] DOB: Sep 05, 1945 Today's Date: 01/04/2024   History of present illness 78 y.o F adm 12/29/23 for hyponatremia and volume overload related to cirrhosis. 10/13 radial R/LHC. PMH: cirrhosis, CHF, CKD, hypothyroidism, Afib TAVR   OT comments  Pt progressing well towards goals. Pt progressed functional mobility distance and improved activity tolerance, tolerating ~20 minutes of OOB activity and ~164ft with RW. Pt still limited by R hip pain, requiring increased time for all activities. Continue to recommend HHOT to optimize independence levels. Will continue to follow acutely.       If plan is discharge home, recommend the following:  A little help with walking and/or transfers;A little help with bathing/dressing/bathroom;Assistance with cooking/housework   Equipment Recommendations  None recommended by OT       Precautions / Restrictions Precautions Precautions: Fall Recall of Precautions/Restrictions: Intact Restrictions Weight Bearing Restrictions Per Provider Order: No       Mobility Bed Mobility Overal bed mobility: Needs Assistance Bed Mobility: Supine to Sit, Sit to Supine     Supine to sit: Min assist, HOB elevated, Used rails Sit to supine: Mod assist, HOB elevated, Used rails   General bed mobility comments: Min assist for trunk to come to sit, mod assist to return legs to bed    Transfers Overall transfer level: Needs assistance Equipment used: Rolling walker (2 wheels) Transfers: Sit to/from Stand Sit to Stand: Contact guard assist           General transfer comment: Cues for hand placement, slow to rise but no assist     Balance Overall balance assessment: Needs assistance Sitting-balance support: No upper extremity supported, Feet supported Sitting balance-Leahy Scale: Good     Standing balance support: Bilateral upper extremity supported, During functional activity,  Reliant on assistive device for balance Standing balance-Leahy Scale: Poor Standing balance comment: reliant on RW       ADL either performed or assessed with clinical judgement   ADL Overall ADL's : Needs assistance/impaired     Grooming: Wash/dry hands;Minimal assistance;Standing Grooming Details (indicate cue type and reason): Min assist d/t slight R lateral lean     Toilet Transfer: Contact guard assist;Ambulation;Regular Toilet;Rolling walker (2 wheels)   Toileting- Clothing Manipulation and Hygiene: Sitting/lateral lean;Contact guard assist       Functional mobility during ADLs: Contact guard assist;Rolling walker (2 wheels) General ADL Comments: CGA for balance    Extremity/Trunk Assessment     Lower Extremity Assessment Lower Extremity Assessment: Defer to PT evaluation        Vision   Vision Assessment?: No apparent visual deficits         Communication Communication Communication: No apparent difficulties   Cognition Arousal: Alert Behavior During Therapy: WFL for tasks assessed/performed Cognition: No apparent impairments             OT - Cognition Comments: Delayed processing observed, worse from eval, will continue to eval       Following commands: Intact        Cueing   Cueing Techniques: Verbal cues        General Comments VSS on RA    Pertinent Vitals/ Pain       Pain Assessment Pain Assessment: 0-10 Pain Score: 5  Pain Location: Rt hip Pain Descriptors / Indicators: Discomfort, Sore Pain Intervention(s): Limited activity within patient's tolerance   Frequency  Min 2X/week        Progress Toward Goals  OT Goals(current  goals can now be found in the care plan section)  Progress towards OT goals: Progressing toward goals  Acute Rehab OT Goals Patient Stated Goal: To get better OT Goal Formulation: With patient Time For Goal Achievement: 01/13/24 Potential to Achieve Goals: Good ADL Goals Pt Will Perform Grooming:  with modified independence;standing Pt Will Perform Lower Body Dressing: with modified independence;sit to/from stand Pt Will Transfer to Toilet: with modified independence;ambulating;regular height toilet Pt Will Perform Toileting - Clothing Manipulation and hygiene: with modified independence;sit to/from stand  Plan         AM-PAC OT 6 Clicks Daily Activity     Outcome Measure   Help from another person eating meals?: None Help from another person taking care of personal grooming?: A Little Help from another person toileting, which includes using toliet, bedpan, or urinal?: A Little Help from another person bathing (including washing, rinsing, drying)?: A Lot Help from another person to put on and taking off regular upper body clothing?: A Little Help from another person to put on and taking off regular lower body clothing?: A Lot 6 Click Score: 17    End of Session Equipment Utilized During Treatment: Gait belt;Rolling walker (2 wheels)  OT Visit Diagnosis: Unsteadiness on feet (R26.81);Other abnormalities of gait and mobility (R26.89);Muscle weakness (generalized) (M62.81)   Activity Tolerance Patient tolerated treatment well   Patient Left in bed;with call bell/phone within reach;with family/visitor present   Nurse Communication Mobility status        Time: 8572-8546 OT Time Calculation (min): 26 min  Charges: OT General Charges $OT Visit: 1 Visit OT Treatments $Self Care/Home Management : 8-22 mins  Adrianne BROCKS, OT  Acute Rehabilitation Services Office [phone] Secure chat preferred   Adrianne GORMAN Savers 01/04/2024, 4:04 PM

## 2024-01-04 NOTE — Progress Notes (Signed)
 Progress Note  Patient Name: Chelsea Boyer Date of Encounter: 01/04/2024  Primary Cardiologist:   Jerel Balding, MD   Subjective   BP 128/54.  Incomplete I's/O's.  Rising Cr (1.43 > 1.58 >1.71). She denies any chest pain or dyspnea at this morning   Inpatient Medications    Scheduled Meds:  fluticasone  1 spray Each Nare Daily   furosemide  80 mg Intravenous BID   lactulose  20 g Oral BID   levothyroxine  50 mcg Intravenous Daily   metoprolol  succinate  25 mg Oral Daily   midodrine  5 mg Oral TID WC   rifaximin  550 mg Oral BID   sodium chloride flush  3 mL Intravenous Q12H   spironolactone   25 mg Oral Daily   warfarin  5 mg Oral ONCE-1600   Warfarin - Pharmacist Dosing Inpatient   Does not apply q1600   Continuous Infusions:  heparin 1,200 Units/hr (01/04/24 0306)   PRN Meds: HYDROcodone-acetaminophen, ondansetron **OR** ondansetron (ZOFRAN) IV, sodium chloride flush   Vital Signs    Vitals:   01/04/24 0500 01/04/24 0523 01/04/24 0732 01/04/24 0919  BP:  131/61 (!) 142/58 (!) 128/54  Pulse:   95 71  Resp:  18 17   Temp:  97.9 F (36.6 C) 97.8 F (36.6 C)   TempSrc:  Oral Oral   SpO2:  99% 98%   Weight: 62.4 kg     Height:        Intake/Output Summary (Last 24 hours) at 01/04/2024 1029 Last data filed at 01/04/2024 0732 Gross per 24 hour  Intake 724 ml  Output 500 ml  Net 224 ml   Filed Weights   01/02/24 1712 01/03/24 0434 01/04/24 0500  Weight: 61.6 kg 60.9 kg 62.4 kg    Telemetry    Atrial fib with controlled rate - Personally Reviewed  ECG    NA - Personally Reviewed  Physical Exam   GEN: No  acute distress.   Neck:  Mildly elevated JVD Cardiac: irregular  3/6 systolic murmur Respiratory: Clear  to auscultation bilaterally. GI: Soft, nontender, distended, normal bowel sounds  MS:  Mild edema; No deformity. Neuro:   Nonfocal  Psych: Oriented and appropriate    Labs    Chemistry Recent Labs  Lab 12/30/23 0639  12/31/23 0525 01/01/24 0527 01/02/24 0315 01/02/24 1421 01/02/24 1425 01/03/24 0240 01/04/24 0759  NA 125* 127* 126* 127*   < > 128* 126* 124*  K 4.7 5.0 4.2 3.9   < > 3.9 4.1 4.3  CL 93* 93* 95* 94*  --   --  92* 88*  CO2 20* 21* 22 21*  --   --  22 24  GLUCOSE 65* 96 109* 95  --   --  102* 91  BUN 28* 30* 29* 30*  --   --  34* 38*  CREATININE 1.38* 1.47* 1.39* 1.43*  --   --  1.58* 1.71*  CALCIUM 8.5* 8.9 8.5* 8.4*  --   --  8.2* 8.6*  PROT 7.4 7.2 6.7  --   --   --   --   --   ALBUMIN 2.6* 3.2* 2.8*  --   --   --   --   --   AST 29 25 27   --   --   --   --   --   ALT 9 8 8   --   --   --   --   --  ALKPHOS 60 52 51  --   --   --   --   --   BILITOT 2.1* 2.6* 2.5*  --   --   --   --   --   GFRNONAA 39* 36* 39* 37*  --   --  33* 30*  ANIONGAP 12 13 9 12   --   --  12 12   < > = values in this interval not displayed.     Hematology Recent Labs  Lab 01/02/24 0315 01/02/24 1421 01/02/24 1425 01/03/24 0240 01/04/24 0759  WBC 8.7  --   --  7.7 7.6  RBC 3.84*  --   --  3.75* 3.99  HGB 10.3*   < > 11.2* 10.1* 10.8*  HCT 30.5*   < > 33.0* 30.2* 31.7*  MCV 79.4*  --   --  80.5 79.4*  MCH 26.8  --   --  26.9 27.1  MCHC 33.8  --   --  33.4 34.1  RDW 18.2*  --   --  18.7* 18.6*  PLT 124*  --   --  112* 113*   < > = values in this interval not displayed.    Cardiac EnzymesNo results for input(s): TROPONINI in the last 168 hours. No results for input(s): TROPIPOC in the last 168 hours.   BNP Recent Labs  Lab 12/29/23 1537  BNP 639.2*     DDimer No results for input(s): DDIMER in the last 168 hours.   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   Echocardiogram shows EF 55 to 60%, moderately reduced RV function with severe enlargement, severely elevated pulmonary pressures (RVSP 77 mmHg), severe biatrial enlargement, mechanical mitral valve with mean gradient 8 mmHg, severe tricuspid regurgitation, TAVR valve with moderate to severe aortic stenosis with mean gradient 33 mmHg.   - RHC on 10/13 showed RA 16, RV 80/1, PA 80/31/47, PCWP 20, CI 3.4, PVR 5 Wood units.  Has severe pre and post capillary PH.  Given elevated PVR, likely prohibitive for valve in valve TAVR - Unfortunately with  her severe PH and right heart failure, in addition to her cirrhosis, and not a good candidate for valve in valve TAVR and unlikely would be very beneficial as appears predominantly precapillary pulmonary hypertension, prognosis is poor.  Recommend palliative evaluation.  D/w with her cardiologist, Dr Francyne - Recommend holding Lasix for now given worsening  renal function with diuresis.  Does have significant JVD on exam but suspect this is due to her severe TR, she received morning dose of lasix, will hold evening dose for today   Mechanical mitral valve:  Stable valve.  Continue heparin.  Can restart warfarin as no further procedures planned    AKI on CKD IIIB:  Creat 1.71, increased from 1.58 yesterday, hold diuresis as above   TAVR: Now with moderate to severe aortic stenosis, RHC suggests likely prohibitive for valve in valve TAVR as above  Chronic atrial fib:  On toprol  XL 25 mg daily. Rate is controlled.   Continue IV heparin, and warfarin   For questions or updates, please contact [facility] Please consult [email or web address] for contact info under Cardiology/STEMI.   Signed, Lonni LITTIE Nanas, MD  01/04/2024, 10:29 AM

## 2024-01-04 NOTE — Progress Notes (Signed)
 Palliative Medicine Inpatient Follow Up Note   HPI:  78 y.o. female  with past medical history of NASH small esophageal varices , A.fib on coumadin  , , hx of mitral valve replacement on coumadin  , CKD, hypothyrodism, anemia, sp TAVR  admitted on 12/29/2023 with abdominal distention, increased confusion fatigued, increased leg and abd swelling. Worth to note that patient has a history of TAVR and now presents with moderate to severe stenosis of the prosthetic valve, likely worsening right ventricular failure. A right heart catheterization on 10/13 showed pulmonary vascular resistance too high for repeat TAVR.   PMT has been consulted to assist with goals of care conversation. Patient/Family face treatment option decisions, advanced directive decisions and anticipatory care needs.    Family face treatment option decision, advance directive decisions and anticipatory care needs.    Today's Discussion 01/04/2024  *Please note that this is a verbal dictation therefore any spelling or grammatical errors are due to the Dragon Medical One system interpretation.  Chart reviewed inclusive of vital signs, progress notes, laboratory results, and diagnostic images.   Created space and opportunity for patient to explore thoughts feelings and fears regarding current medical situation.  I visited patient at bedside today, present at bedside is her daughter [name]. Patient is chronically-ill appearing. Not in any form of distress. Alert and oriented, able to converse and make needs known.  She said she is feeling fair. Denies pain and discomfort. She mentioned that she is looking forward to get better, go home and spend time with family/grand children. She reports working with PT to get her strength and endurance improved.   I proceed to discuss goals of care, this is unchanged, continue current medical management, allow time for outcomes. I shared that from my knowledge and understanding, patient has  severe heart failure, making medical interventions limited, hence prognosis is poor. I discussed code status with daughter [name], explained full code, DNR-limited and DNR-comfort. I explained that currently patient is categorized as full code, I discussed what full code entails. I encouraged patient/family to consider DNR/DNI status understanding evidenced based poor outcomes in similar hospitalized patient, as the cause of arrest is likely associated with advanced chronic/terminal illness rather than an easily reversible acute cardio-pulmonary event. I explained that DNR/DNI does not change the medical plan and it only comes into effect after a person has arrested (died).  It is a protective measure to keep us  from harming the patient in their last moments of life.   Daughter mentioned that she would want to get more insight from patient's attending and cardiologist the patient's current medical state, before making code status change.  My understanding is patient and family is leaning towards DNR-limited but wants more time to discuss. I shared that I will relate this to the team.   I briefly discussed the difference between aggressive medical intervention and comfort care was considered in light of the patient's goals of care.   Patient and her family face treatment option decisions, advanced directive decisions and anticipatory care needs.   Questions and concerns addressed   Palliative Support Provided.   Objective Assessment: Vital Signs Vitals:   01/04/24 0919 01/04/24 1152  BP: (!) 128/54 116/77  Pulse: 71 95  Resp: 17 17  Temp:  97.8 F (36.6 C)  SpO2:  98%    Intake/Output Summary (Last 24 hours) at 01/04/2024 1213 Last data filed at 01/04/2024 0732 Gross per 24 hour  Intake 724 ml  Output 500 ml  Net 224  ml   Last Weight  Most recent update: 01/04/2024  5:19 AM    Weight  62.4 kg (137 lb 9.1 oz)             Exam: Gen Exam:Alert awake-not in any distress, generalized  weakness HEENT: atraumatic, normocephalic Chest: B/L clear to auscultation anteriorly CVS:HR 79 Abdomen:distended Extremities:no edema Neurology: Non focal Skin: no rash  SUMMARY OF RECOMMENDATIONS    Code Status: Maintain Full Code. Patient/family is considering changing code status to DNR-Limited, pending further discussion with family.  Continue current scope of care Goal of care is medical stabilization and recovery to the extent this is possible Spiritual care consult to assist with advance directive creation (consult placed 01/04/2024) Continue to provide psycho-social and emotional support to patient and family Palliative medicine team will continue to follow.    Symptom Management: Per Primary team  Time Spent: 50 minutes  Detailed review of medical records (labs, imaging, vital signs), medically appropriate exam, discussed with treatment team, counseling and education to patient, family, & staff, documenting clinical information, coordination of care.    ______________________________________________________________________________________ Kathlyne Bolder NP-C [facility] Palliative Medicine Team Team Cell Phone: [phone] Please utilize secure chat with additional questions, if there is no response within 30 minutes please call the above phone number  Palliative Medicine Team providers are available by phone from 7am to 7pm daily and can be reached through the team cell phone.  Should this patient require assistance outside of these hours, please call the patient's attending physician.

## 2024-01-05 DIAGNOSIS — Z7189 Other specified counseling: Secondary | ICD-10-CM | POA: Diagnosis not present

## 2024-01-05 DIAGNOSIS — E871 Hypo-osmolality and hyponatremia: Secondary | ICD-10-CM | POA: Diagnosis not present

## 2024-01-05 DIAGNOSIS — I4821 Permanent atrial fibrillation: Secondary | ICD-10-CM | POA: Diagnosis not present

## 2024-01-05 DIAGNOSIS — I50810 Right heart failure, unspecified: Secondary | ICD-10-CM | POA: Diagnosis not present

## 2024-01-05 DIAGNOSIS — N179 Acute kidney failure, unspecified: Secondary | ICD-10-CM | POA: Diagnosis not present

## 2024-01-05 DIAGNOSIS — Z515 Encounter for palliative care: Secondary | ICD-10-CM | POA: Diagnosis not present

## 2024-01-05 DIAGNOSIS — I5033 Acute on chronic diastolic (congestive) heart failure: Secondary | ICD-10-CM | POA: Diagnosis not present

## 2024-01-05 DIAGNOSIS — E43 Unspecified severe protein-calorie malnutrition: Secondary | ICD-10-CM | POA: Insufficient documentation

## 2024-01-05 LAB — COMPREHENSIVE METABOLIC PANEL WITH GFR
ALT: 8 U/L (ref 0–44)
AST: 29 U/L (ref 15–41)
Albumin: 2.5 g/dL — ABNORMAL LOW (ref 3.5–5.0)
Alkaline Phosphatase: 63 U/L (ref 38–126)
Anion gap: 12 (ref 5–15)
BUN: 40 mg/dL — ABNORMAL HIGH (ref 8–23)
CO2: 22 mmol/L (ref 22–32)
Calcium: 8.3 mg/dL — ABNORMAL LOW (ref 8.9–10.3)
Chloride: 91 mmol/L — ABNORMAL LOW (ref 98–111)
Creatinine, Ser: 1.49 mg/dL — ABNORMAL HIGH (ref 0.44–1.00)
GFR, Estimated: 36 mL/min — ABNORMAL LOW (ref >60)
Glucose, Bld: 85 mg/dL (ref 70–99)
Potassium: 4.1 mmol/L (ref 3.5–5.1)
Sodium: 125 mmol/L — ABNORMAL LOW (ref 135–145)
Total Bilirubin: 2.5 mg/dL — ABNORMAL HIGH (ref 0.0–1.2)
Total Protein: 6.6 g/dL (ref 6.5–8.1)

## 2024-01-05 LAB — CBC
HCT: 30.2 % — ABNORMAL LOW (ref 36.0–46.0)
Hemoglobin: 10.1 g/dL — ABNORMAL LOW (ref 12.0–15.0)
MCH: 26.6 pg (ref 26.0–34.0)
MCHC: 33.4 g/dL (ref 30.0–36.0)
MCV: 79.5 fL — ABNORMAL LOW (ref 80.0–100.0)
Platelets: 118 K/uL — ABNORMAL LOW (ref 150–400)
RBC: 3.8 MIL/uL — ABNORMAL LOW (ref 3.87–5.11)
RDW: 18.7 % — ABNORMAL HIGH (ref 11.5–15.5)
WBC: 7.4 K/uL (ref 4.0–10.5)
nRBC: 0 % (ref 0.0–0.2)

## 2024-01-05 LAB — MAGNESIUM: Magnesium: 2 mg/dL (ref 1.7–2.4)

## 2024-01-05 LAB — HEPARIN LEVEL (UNFRACTIONATED): Heparin Unfractionated: 0.55 [IU]/mL (ref 0.30–0.70)

## 2024-01-05 LAB — PROTIME-INR
INR: 2.9 — ABNORMAL HIGH (ref 0.8–1.2)
Prothrombin Time: 31.7 s — ABNORMAL HIGH (ref 11.4–15.2)

## 2024-01-05 MED ORDER — WARFARIN SODIUM 2.5 MG PO TABS
2.5000 mg | ORAL_TABLET | Freq: Once | ORAL | Status: AC
  Administered 2024-01-05: 2.5 mg via ORAL
  Filled 2024-01-05: qty 1

## 2024-01-05 MED ORDER — TORSEMIDE 20 MG PO TABS
40.0000 mg | ORAL_TABLET | Freq: Every day | ORAL | Status: DC
  Administered 2024-01-05 – 2024-01-16 (×12): 40 mg via ORAL
  Filled 2024-01-05 (×12): qty 2

## 2024-01-05 NOTE — Progress Notes (Signed)
 Palliative Medicine Inpatient Follow Up Note   HPI:  78 y.o. female  with past medical history of NASH small esophageal varices , A.fib on coumadin  , , hx of mitral valve replacement on coumadin  , CKD, hypothyrodism, anemia, sp TAVR  admitted on 12/29/2023 with abdominal distention, increased confusion fatigued, increased leg and abd swelling. Worth to note that patient has a history of TAVR and now presents with moderate to severe stenosis of the prosthetic valve, likely worsening right ventricular failure. A right heart catheterization on 10/13 showed pulmonary vascular resistance too high for repeat TAVR.   PMT has been consulted to assist with goals of care conversation. Patient/Family face treatment option decisions, advanced directive decisions and anticipatory care needs.    Family face treatment option decision, advance directive decisions and anticipatory care needs.    Today's Discussion 01/05/2024  *Please note that this is a verbal dictation therefore any spelling or grammatical errors are due to the Dragon Medical One system interpretation.  Chart reviewed inclusive of vital signs, progress notes, laboratory results, and diagnostic images.   Created space and opportunity for patient to explore thoughts feelings and fears regarding current medical situation.  Patient was seen at bedside today with her husband present. She appeared chronically ill but was not in any acute distress. She denied experiencing shortness of breath, pain, or discomfort. Oxygen saturation was stable at 96% on room air. She reported a restful night and noted that her appetite is improving. She remains hopeful about being discharged home soon.  Her husband shared that their daughter, [name], would like to discuss goals of care. I subsequently contacted [name] and we reviewed the  patient's code status from Full Code to DNR-Limited. Given the patient's significant disease burden, I expressed that this  decision is clinically appropriate. [name] also inquired about discharge planning, including home health services such as PT/OT, and mentioned she believed discharge may occur tomorrow. I informed her that the current recommendation is discharge home with home health services. She requested that staff reach out to her regarding discharge logistics. Additionally, she asked for a referral to outpatient palliative care, as discussed in our previous meeting. I assured her that I would place the consult. She expressed appreciation for the call.  Patient and her family face treatment option decisions, advanced directive decisions and anticipatory care needs.   Questions and concerns addressed   Palliative Support Provided.   Objective Assessment: Vital Signs Vitals:   01/05/24 0714 01/05/24 1124  BP: (!) 121/52 (!) 117/58  Pulse: 85 77  Resp: 17 20  Temp: 97.9 F (36.6 C) 98 F (36.7 C)  SpO2: 96% 97%    Intake/Output Summary (Last 24 hours) at 01/05/2024 1424 Last data filed at 01/05/2024 0900 Gross per 24 hour  Intake 123 ml  Output 200 ml  Net -77 ml   Last Weight  Most recent update: 01/05/2024  3:55 AM    Weight  67.6 kg (149 lb 0.5 oz)             Exam: Gen Exam:Alert awake-not in any distress, generalized weakness HEENT: atraumatic, normocephalic Chest: B/L clear to auscultation anteriorly CVS:HR 79 Abdomen:distended Extremities:no edema Neurology: Non focal Skin: no rash  SUMMARY OF RECOMMENDATIONS    Code Status: Maintain DNR-Limited Continue current scope of care Goal of care is medical stabilization and recovery to the extent this is possible TOC consult placed for outpatient Palliative services Spiritual care consult to assist with advance directive creation (consult placed 01/04/2024)  Continue to provide psycho-social and emotional support to patient and family Palliative medicine team will continue to follow.    Symptom Management: Per Primary  team  Time Spent: 35 minutes  Detailed review of medical records (labs, imaging, vital signs), medically appropriate exam, discussed with treatment team, counseling and education to patient, family, & staff, documenting clinical information, coordination of care.    ______________________________________________________________________________________ Kathlyne Bolder NP-C [facility] Palliative Medicine Team Team Cell Phone: [phone] Please utilize secure chat with additional questions, if there is no response within 30 minutes please call the above phone number  Palliative Medicine Team providers are available by phone from 7am to 7pm daily and can be reached through the team cell phone.  Should this patient require assistance outside of these hours, please call the patient's attending physician.

## 2024-01-05 NOTE — Progress Notes (Signed)
 Heart Failure Navigator Progress Note  Assessed for Heart & Vascular TOC clinic readiness.  Patient does not meet criteria due to EF 55-60%, Palliative consult placed for Goals of care, per MD poor prognosis. Has a scheduled [facility] appointment on 01/09/2024. No HF TOC.     Navigator will sign off at this time.   Stephane Haddock, BSN, Scientist, clinical (histocompatibility and immunogenetics) Only

## 2024-01-05 NOTE — Progress Notes (Signed)
 PHARMACY - ANTICOAGULATION CONSULT NOTE  Pharmacy Consult for Heparin Indication: mechanical mitral valve and subtherapeutic INR on warfarin  No Known Allergies  Patient Measurements: Height: 5' 1 (154.9 cm) Weight: 67.6 kg (149 lb 0.5 oz) IBW/kg (Calculated) : 47.8 HEPARIN DW (KG): 60.3  Vital Signs: Temp: 97.9 F (36.6 C) (10/16 0714) Temp Source: Oral (10/16 0714) BP: 121/52 (10/16 0714) Pulse Rate: 85 (10/16 0714)  Labs: Recent Labs    01/03/24 0240 01/03/24 1013 01/03/24 2154 01/03/24 2220 01/04/24 0759 01/05/24 0245  HGB 10.1*  --   --   --  10.8* 10.1*  HCT 30.2*  --   --   --  31.7* 30.2*  PLT 112*  --   --   --  113* 118*  LABPROT  --  20.7*  --   --  22.9* 31.7*  INR  --  1.7*  --   --  1.9* 2.9*  HEPARINUNFRC <0.10* 0.12*   < > 0.30 0.56 0.55  CREATININE 1.58*  --   --   --  1.71* 1.49*   < > = values in this interval not displayed.   Estimated Creatinine Clearance: 26.9 mL/min (A) (by C-G formula based on SCr of 1.49 mg/dL (H)).  Infusions:   heparin 1,200 Units/hr (01/05/24 0028)    Assessment: Patient is a 78 YO F presenting with abdominal distension, peripheral edema, and fatigue. 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 According to last anticoag clinic note on 9/25, warfarin regimen was one-half 5 mg tablet daily and INR was 3 at that time. Warfarin on hold   Now s/p cath 10/13 and heparin resumed post cath and warfarin restarted -Heparin level= 0.55 on 1200 units/hr  -INR= 1.7> 1.9> 2.9   Goal of Therapy:  INR goal 2.5-3.5 Heparin level 0.3-0.7 units/ml Monitor platelets by anticoagulation protocol: Yes   Plan:  -Consider d/c heparin today -Warfarin 2.5mg  po today -Daily PT/INR  Prentice Poisson, PharmD Clinical Pharmacist **Pharmacist phone directory can now be found on [email or web address] ([email or web address]).  Listed under [facility].

## 2024-01-05 NOTE — Progress Notes (Signed)
 Mobility Specialist Progress Note:    01/05/24 1451  Mobility  Activity Turned to back - supine (Ankle Pumps, Leg lifts, heel slides)  Level of Assistance Standby assist, set-up cues, supervision of patient - no hands on  Assistive Device None  Range of Motion/Exercises Left leg;Right leg  Activity Response Tolerated fair  Mobility Referral Yes  Mobility visit 1 Mobility  Mobility Specialist Start Time (ACUTE ONLY) 1451  Mobility Specialist Stop Time (ACUTE ONLY) 1457  Mobility Specialist Time Calculation (min) (ACUTE ONLY) 6 min   Received pt laying in bed pleasant not wanting to ambulate but agreeable to bed exercises since she was just put back. No c/o any symptoms other than being tired. Pt able to move and perform exercises w/ little difficulty. Left pt in bed w/ all needs met.   Venetia Keel Mobility Specialist Please Neurosurgeon or Rehab Office at [phone]

## 2024-01-05 NOTE — Progress Notes (Signed)
 Progress Note   Patient: Chelsea Boyer FMW:[id number] DOB: [birth year] DOA: 12/29/2023     6 DOS: the patient was seen and examined on 01/05/2024   Brief hospital course:  Patient is a 78 y.o.  female with history of Hollie liver cirrhosis, chronic HFpEF/RV failure, A-fib on Coumadin , mechanical mitral valve replacement, TAVR-who presented with anasarca-thought to be secondary to combination of decompensated liver cirrhosis and RV failure. - Admitted, started on high-dose diuretics, underwent paracentesis -Echo noted TAVR with moderate to severe aortic stenosis, moderately reduced RV, severely elevated PA pressures, - 10/13 right heart cath with significantly elevated PA pressures and wedge - 10/14: Poor prognosis, palliative care consulted   Significant studies: 10/9>> CT head: No acute intracranial abnormality. 10/9>> CT abdomen/pelvis: Cirrhosis-large volume ascites. 10/9>> CXR: No acute abnormality. 10/10>> echo: EF 55-60%, RV systolic function moderately reduced, RVSP 76.8.  Moderate to severe aortic valve stenosis.   Procedures: 10/10>> paracentesis (WBC 105,8% neutrophils). 10/13>> RHC (RV 80/31, PCW P: 20 mmHg)     Assessment/Plan:   Acute on chronic HFpEF, anasarca, large volume ascites RV failure Pulmonary hypertension -Echo noted EF 55-60%, moderately reduced RV with severe enlargement, severely elevated PASP, RSVP 77, mechanical mitral valve, severe tricuspid regurgitation, TAVR valve with moderate to severe aortic stenosis -RHC on 10/13 showed RA 16, RV 80/1, PA 80/31/47, PCWP 20, CI 3.4, PVR 5 Wood units. Has severe pre and post capillary PH  -Cards following, volume status improving after diuresis and paracentesis - with severe pulmonary hypertension and right heart failure not felt to be a good candidate for valve in valve TAVR, prognosis felt to be poor, Palliative care consulted, we appreciate input   History of TAVR-now with moderate to severe aortic stenosis of  prosthetic valve. Probably exacerbating RV failure S/p RHC on 10/13-PVR prohibitive for TAVR. Palliative care following and discussing with patient's family   Hyponatremia-persistent -Secondary to hypovolemia from above   Decompensated NASH liver cirrhosis Continue diuretics S/p paracentesis 10/10-no evidence of SBP -Continue lactulose rifaximin   Chronic A-fib Rate controlled-metoprolol  Coumadin , IV heparin   History of mechanical mitral valve replacement Coumadin  briefly held for RHC/LHC Warfarin resumed Heparin drip discontinued Pharmacist assisting with INR monitoring   Hypothyroidism Continue Synthroid   Normocytic anemia Likely secondary to acute illness superimposed on underlying chronic disease Monitor Hb   Debility/deconditioning PT/OT eval-Home health recommended.   Palliative care/goals of care DNI DNR    Code status: now DNR   DVT Prophylaxis:Warf/heparin Family Communication: no fam at bedside patient updated on 01/04/2024 Disposition Plan: TBD   Planned Discharge Destination:Home health   Subjective:  Seen and examined at bedside this morning Denies nausea vomiting abdominal pain chest pain Prognosis guarded  Physical Exam: Exam: Gen Exam:Alert awake, chronically ill, AO x 3 HEENT: Positive JVD HEENT:atraumatic, normocephalic Chest: B/L clear to auscultation anteriorly CVS:S1S2 regular Abdomen: Soft, distended, nontender, umbilical hernia Extremities: 1-2+ edema predominantly in thighs Neurology: Non focal Skin: no rashes  Vitals:   01/05/24 0353 01/05/24 0355 01/05/24 0714 01/05/24 1124  BP:  (!) 106/50 (!) 121/52 (!) 117/58  Pulse:  83 85 77  Resp:  18 17 20   Temp:  97.6 F (36.4 C) 97.9 F (36.6 C) 98 F (36.7 C)  TempSrc:  Oral Oral Oral  SpO2:  99% 96% 97%  Weight: 67.6 kg     Height:        Data Reviewed:     Latest Ref Rng & Units 01/05/2024    2:45 AM 01/04/2024  7:59 AM 01/03/2024    2:40 AM  CBC  WBC 4.0 -  10.5 K/uL 7.4  7.6  7.7   Hemoglobin 12.0 - 15.0 g/dL 89.8  89.1  89.8   Hematocrit 36.0 - 46.0 % 30.2  31.7  30.2   Platelets 150 - 400 K/uL 118  113  112        Latest Ref Rng & Units 01/05/2024    2:45 AM 01/04/2024    7:59 AM 01/03/2024    2:40 AM  BMP  Glucose 70 - 99 mg/dL 85  91  897   BUN 8 - 23 mg/dL 40  38  34   Creatinine 0.44 - 1.00 mg/dL 8.50  8.28  8.41   Sodium 135 - 145 mmol/L 125  124  126   Potassium 3.5 - 5.1 mmol/L 4.1  4.3  4.1   Chloride 98 - 111 mmol/L 91  88  92   CO2 22 - 32 mmol/L 22  24  22    Calcium 8.9 - 10.3 mg/dL 8.3  8.6  8.2       Author: Drue ONEIDA Potter, MD 01/05/2024 3:55 PM  For on call review [email or web address].

## 2024-01-05 NOTE — Progress Notes (Signed)
 Progress Note  Patient Name: Chelsea Boyer Date of Encounter: 01/05/2024  Primary Cardiologist:   Jerel Balding, MD   Subjective   BP 121/52.  Incomplete I's/O's.  Renal function improved, creatinine 1.71 > 1.49.  Denies any chest pain or dyspnea.   Inpatient Medications    Scheduled Meds:  feeding supplement  237 mL Oral BID BM   fluticasone  1 spray Each Nare Daily   lactulose  20 g Oral BID   levothyroxine  50 mcg Intravenous Daily   metoprolol  succinate  25 mg Oral Daily   midodrine  5 mg Oral TID WC   rifaximin  550 mg Oral BID   sodium chloride flush  3 mL Intravenous Q12H   spironolactone   25 mg Oral Daily   warfarin  2.5 mg Oral ONCE-1600   Warfarin - Pharmacist Dosing Inpatient   Does not apply q1600   Continuous Infusions:  heparin 1,200 Units/hr (01/05/24 0028)   PRN Meds: HYDROcodone-acetaminophen, ondansetron **OR** ondansetron (ZOFRAN) IV, sodium chloride flush   Vital Signs    Vitals:   01/04/24 2351 01/05/24 0353 01/05/24 0355 01/05/24 0714  BP: (!) 112/53  (!) 106/50 (!) 121/52  Pulse: 83  83 85  Resp: 17  18 17   Temp: 98.1 F (36.7 C)  97.6 F (36.4 C) 97.9 F (36.6 C)  TempSrc: Oral  Oral Oral  SpO2: 96%  99% 96%  Weight:  67.6 kg    Height:        Intake/Output Summary (Last 24 hours) at 01/05/2024 1011 Last data filed at 01/05/2024 0500 Gross per 24 hour  Intake 3 ml  Output 200 ml  Net -197 ml   Filed Weights   01/03/24 0434 01/04/24 0500 01/05/24 0353  Weight: 60.9 kg 62.4 kg 67.6 kg    Telemetry    Atrial fib with controlled rate - Personally Reviewed  ECG    NA - Personally Reviewed  Physical Exam   GEN: No  acute distress.   Neck:  Mildly elevated JVD Cardiac: irregular  3/6 systolic murmur Respiratory: Clear  to auscultation bilaterally. GI: Soft, nontender, distended, normal bowel sounds  MS:  Mild edema; No deformity. Neuro:   Nonfocal  Psych: Oriented and appropriate    Labs    Chemistry Recent  Labs  Lab 12/31/23 0525 01/01/24 0527 01/02/24 0315 01/03/24 0240 01/04/24 0759 01/05/24 0245  NA 127* 126*   < > 126* 124* 125*  K 5.0 4.2   < > 4.1 4.3 4.1  CL 93* 95*   < > 92* 88* 91*  CO2 21* 22   < > 22 24 22   GLUCOSE 96 109*   < > 102* 91 85  BUN 30* 29*   < > 34* 38* 40*  CREATININE 1.47* 1.39*   < > 1.58* 1.71* 1.49*  CALCIUM 8.9 8.5*   < > 8.2* 8.6* 8.3*  PROT 7.2 6.7  --   --   --  6.6  ALBUMIN 3.2* 2.8*  --   --   --  2.5*  AST 25 27  --   --   --  29  ALT 8 8  --   --   --  8  ALKPHOS 52 51  --   --   --  63  BILITOT 2.6* 2.5*  --   --   --  2.5*  GFRNONAA 36* 39*   < > 33* 30* 36*  ANIONGAP 13 9   < >  12 12 12    < > = values in this interval not displayed.     Hematology Recent Labs  Lab 01/03/24 0240 01/04/24 0759 01/05/24 0245  WBC 7.7 7.6 7.4  RBC 3.75* 3.99 3.80*  HGB 10.1* 10.8* 10.1*  HCT 30.2* 31.7* 30.2*  MCV 80.5 79.4* 79.5*  MCH 26.9 27.1 26.6  MCHC 33.4 34.1 33.4  RDW 18.7* 18.6* 18.7*  PLT 112* 113* 118*    Cardiac EnzymesNo results for input(s): TROPONINI in the last 168 hours. No results for input(s): TROPIPOC in the last 168 hours.   BNP Recent Labs  Lab 12/29/23 1537  BNP 639.2*     DDimer No results for input(s): DDIMER in the last 168 hours.   Radiology    No results found.    Cardiac Studies   Echo above  Patient Profile     78 y.o. 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   Echocardiogram shows EF 55 to 60%, moderately reduced RV function with severe enlargement, severely elevated pulmonary pressures (RVSP 77 mmHg), severe biatrial enlargement, mechanical mitral valve with mean gradient 8 mmHg, severe tricuspid regurgitation, TAVR valve with moderate to severe aortic stenosis with mean gradient 33 mmHg.   - RHC on 10/13 showed RA 16, RV 80/1, PA 80/31/47, PCWP 20, CI 3.4, PVR 5 Wood units.  Has severe pre and post capillary PH.  Given elevated PVR, likely prohibitive for valve in valve TAVR - Unfortunately with her severe PH and right heart failure, in addition to her cirrhosis, and not a good candidate for valve in valve TAVR and unlikely would be very beneficial as appears predominantly precapillary pulmonary hypertension, prognosis is poor.  Recommend palliative evaluation.  D/w with her cardiologist, Dr Francyne - Held Lasix yesterday given worsening renal function with diuresis.  Does have significant JVD on exam but suspect this is due to her severe TR.  Will transition to p.o. torsemide  today   Mechanical mitral valve:  Stable valve.  Restarted warfarin as no further procedures planned.  Now therapeutic INR, will d/c heparin gtt   AKI on CKD IIIB:  Creat 1.49, improved from 1.71 yesterday, will transition to p.o. torsemide  as above   TAVR: Now with moderate to severe aortic stenosis, RHC suggests likely prohibitive for valve in valve TAVR as above  Chronic atrial fib:  On toprol  XL 25 mg daily. Rate is controlled.   Continue warfarin as above   For questions or updates, please contact [facility] Please consult [email or web address] for contact info under Cardiology/STEMI.   Signed, Lonni LITTIE Nanas, MD  01/05/2024, 10:11 AM

## 2024-01-06 ENCOUNTER — Encounter (HOSPITAL_COMMUNITY): Payer: Self-pay | Admitting: Internal Medicine

## 2024-01-06 ENCOUNTER — Inpatient Hospital Stay (HOSPITAL_COMMUNITY)

## 2024-01-06 DIAGNOSIS — E871 Hypo-osmolality and hyponatremia: Secondary | ICD-10-CM | POA: Diagnosis not present

## 2024-01-06 DIAGNOSIS — E039 Hypothyroidism, unspecified: Secondary | ICD-10-CM | POA: Diagnosis not present

## 2024-01-06 DIAGNOSIS — I5033 Acute on chronic diastolic (congestive) heart failure: Secondary | ICD-10-CM | POA: Diagnosis not present

## 2024-01-06 DIAGNOSIS — I50810 Right heart failure, unspecified: Secondary | ICD-10-CM | POA: Diagnosis not present

## 2024-01-06 DIAGNOSIS — I4821 Permanent atrial fibrillation: Secondary | ICD-10-CM | POA: Diagnosis not present

## 2024-01-06 DIAGNOSIS — E43 Unspecified severe protein-calorie malnutrition: Secondary | ICD-10-CM

## 2024-01-06 DIAGNOSIS — I824Y9 Acute embolism and thrombosis of unspecified deep veins of unspecified proximal lower extremity: Secondary | ICD-10-CM | POA: Diagnosis not present

## 2024-01-06 LAB — CBC
HCT: 26.8 % — ABNORMAL LOW (ref 36.0–46.0)
Hemoglobin: 9.3 g/dL — ABNORMAL LOW (ref 12.0–15.0)
MCH: 26.7 pg (ref 26.0–34.0)
MCHC: 34.7 g/dL (ref 30.0–36.0)
MCV: 77 fL — ABNORMAL LOW (ref 80.0–100.0)
Platelets: 131 K/uL — ABNORMAL LOW (ref 150–400)
RBC: 3.48 MIL/uL — ABNORMAL LOW (ref 3.87–5.11)
RDW: 18.5 % — ABNORMAL HIGH (ref 11.5–15.5)
WBC: 6.5 K/uL (ref 4.0–10.5)
nRBC: 0 % (ref 0.0–0.2)

## 2024-01-06 LAB — BASIC METABOLIC PANEL WITH GFR
Anion gap: 9 (ref 5–15)
BUN: 46 mg/dL — ABNORMAL HIGH (ref 8–23)
CO2: 23 mmol/L (ref 22–32)
Calcium: 8.4 mg/dL — ABNORMAL LOW (ref 8.9–10.3)
Chloride: 93 mmol/L — ABNORMAL LOW (ref 98–111)
Creatinine, Ser: 1.58 mg/dL — ABNORMAL HIGH (ref 0.44–1.00)
GFR, Estimated: 33 mL/min — ABNORMAL LOW (ref >60)
Glucose, Bld: 76 mg/dL (ref 70–99)
Potassium: 3.9 mmol/L (ref 3.5–5.1)
Sodium: 125 mmol/L — ABNORMAL LOW (ref 135–145)

## 2024-01-06 LAB — MAGNESIUM: Magnesium: 1.9 mg/dL (ref 1.7–2.4)

## 2024-01-06 LAB — PROTIME-INR
INR: 4.1 (ref 0.8–1.2)
Prothrombin Time: 41.7 s — ABNORMAL HIGH (ref 11.4–15.2)

## 2024-01-06 MED ORDER — LEVOTHYROXINE SODIUM 25 MCG PO TABS
125.0000 ug | ORAL_TABLET | Freq: Every day | ORAL | Status: DC
  Administered 2024-01-07 – 2024-01-16 (×10): 125 ug via ORAL
  Filled 2024-01-06 (×11): qty 1

## 2024-01-06 NOTE — Progress Notes (Signed)
 PHARMACY - ANTICOAGULATION CONSULT NOTE  Pharmacy Consult for Heparin Indication: mechanical mitral valve   No Known Allergies  Patient Measurements: Height: 5' 1 (154.9 cm) Weight: 66.4 kg (146 lb 6.2 oz) IBW/kg (Calculated) : 47.8 HEPARIN DW (KG): 60.3  Vital Signs: Temp: 97.8 F (36.6 C) (10/17 0730) Temp Source: Oral (10/17 0730) BP: 99/51 (10/17 0320) Pulse Rate: 93 (10/17 0320)  Labs: Recent Labs    01/03/24 1013 01/03/24 2220 01/04/24 0759 01/05/24 0245 01/06/24 0235  HGB   < >  --  10.8* 10.1* 9.3*  HCT  --   --  31.7* 30.2* 26.8*  PLT  --   --  113* 118* 131*  LABPROT  --   --  22.9* 31.7* 41.7*  INR  --   --  1.9* 2.9* 4.1*  HEPARINUNFRC  --  0.30 0.56 0.55  --   CREATININE  --   --  1.71* 1.49* 1.58*   < > = values in this interval not displayed.   Estimated Creatinine Clearance: 25.2 mL/min (A) (by C-G formula based on SCr of 1.58 mg/dL (H)).  Infusions:     Assessment: Patient is a 78 YO F presenting with abdominal distension, peripheral edema, and fatigue. 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 According to last anticoag clinic note on 9/25, warfarin regimen was one-half 5 mg tablet daily and INR was 3 at that time. Warfarin on hold   Now s/p cath 10/13 and back on warfarin -INR= 1.7> 1.9> 2.9> 4.1   Goal of Therapy:  INR goal 2.5-3.5 Heparin level 0.3-0.7 units/ml Monitor platelets by anticoagulation protocol: Yes   Plan:  -Hold warfarin today -Daily PT/INR  Prentice Poisson, PharmD Clinical Pharmacist **Pharmacist phone directory can now be found on [email or web address] ([email or web address]).  Listed under [facility].

## 2024-01-06 NOTE — Care Management Important Message (Signed)
 Important Message  Patient Details  Name: Chelsea Boyer MRN: [id number] Date of Birth: [birth year]   Important Message Given:  Yes - Medicare IM     [name] 01/06/2024, 11:39 AM

## 2024-01-06 NOTE — Plan of Care (Signed)
  Problem: Education: Goal: Knowledge of General Education information will improve Description: Including pain rating scale, medication(s)/side effects and non-pharmacologic comfort measures Outcome: Progressing   Problem: Health Behavior/Discharge Planning: Goal: Ability to manage health-related needs will improve Outcome: Progressing   Problem: Clinical Measurements: Goal: Ability to maintain clinical measurements within normal limits will improve Outcome: Progressing Goal: Will remain free from infection Outcome: Progressing Goal: Diagnostic test results will improve Outcome: Progressing Goal: Respiratory complications will improve Outcome: Progressing Goal: Cardiovascular complication will be avoided Outcome: Progressing   Problem: Activity: Goal: Risk for activity intolerance will decrease Outcome: Progressing   Problem: Nutrition: Goal: Adequate nutrition will be maintained Outcome: Progressing   Problem: Coping: Goal: Level of anxiety will decrease Outcome: Progressing   Problem: Elimination: Goal: Will not experience complications related to bowel motility Outcome: Progressing Goal: Will not experience complications related to urinary retention Outcome: Progressing   Problem: Pain Managment: Goal: General experience of comfort will improve and/or be controlled Outcome: Progressing   Problem: Safety: Goal: Ability to remain free from injury will improve Outcome: Progressing   Problem: Skin Integrity: Goal: Risk for impaired skin integrity will decrease Outcome: Progressing   Problem: Education: Goal: Ability to demonstrate management of disease process will improve Outcome: Progressing Goal: Ability to verbalize understanding of medication therapies will improve Outcome: Progressing Goal: Individualized Educational Video(s) Outcome: Progressing   Problem: Activity: Goal: Capacity to carry out activities will improve Outcome: Progressing    Problem: Cardiac: Goal: Ability to achieve and maintain adequate cardiopulmonary perfusion will improve Outcome: Progressing   Problem: Clinical Measurements: Goal: Ability to avoid or minimize complications of infection will improve Outcome: Progressing   Problem: Skin Integrity: Goal: Skin integrity will improve Outcome: Progressing   Problem: Education: Goal: Understanding of CV disease, CV risk reduction, and recovery process will improve Outcome: Progressing Goal: Individualized Educational Video(s) Outcome: Progressing   Problem: Activity: Goal: Ability to return to baseline activity level will improve Outcome: Progressing   Problem: Cardiovascular: Goal: Ability to achieve and maintain adequate cardiovascular perfusion will improve Outcome: Progressing Goal: Vascular access site(s) Level 0-1 will be maintained Outcome: Progressing   Problem: Health Behavior/Discharge Planning: Goal: Ability to safely manage health-related needs after discharge will improve Outcome: Progressing

## 2024-01-06 NOTE — Progress Notes (Signed)
 Progress Note  Patient Name: Chelsea Boyer Date of Encounter: 01/06/2024  Primary Cardiologist:   Jerel Balding, MD   Subjective   BP 99/51.  Incomplete I's/O's.  Creatinine 1.71 > 1.49 >1.58.  Denies any chest pain or dyspnea.   Inpatient Medications    Scheduled Meds:  feeding supplement  237 mL Oral BID BM   fluticasone  1 spray Each Nare Daily   lactulose  20 g Oral BID   levothyroxine  50 mcg Intravenous Daily   metoprolol  succinate  25 mg Oral Daily   midodrine  5 mg Oral TID WC   rifaximin  550 mg Oral BID   sodium chloride flush  3 mL Intravenous Q12H   spironolactone   25 mg Oral Daily   torsemide   40 mg Oral Daily   Warfarin - Pharmacist Dosing Inpatient   Does not apply q1600   Continuous Infusions:   PRN Meds: HYDROcodone-acetaminophen, ondansetron **OR** ondansetron (ZOFRAN) IV, sodium chloride flush   Vital Signs    Vitals:   01/05/24 2359 01/06/24 0320 01/06/24 0323 01/06/24 0730  BP: (!) 115/54 (!) 99/51    Pulse: 87 93    Resp: 20 20    Temp: 97.9 F (36.6 C) 97.9 F (36.6 C)  97.8 F (36.6 C)  TempSrc: Oral Oral  Oral  SpO2: 97% 98%    Weight: 66.4 kg  66.4 kg   Height:        Intake/Output Summary (Last 24 hours) at 01/06/2024 1012 Last data filed at 01/06/2024 0900 Gross per 24 hour  Intake 363 ml  Output --  Net 363 ml   Filed Weights   01/05/24 0353 01/05/24 2359 01/06/24 0323  Weight: 67.6 kg 66.4 kg 66.4 kg    Telemetry    Atrial fib with controlled rate - Personally Reviewed  ECG    NA - Personally Reviewed  Physical Exam   GEN: No  acute distress.   Neck:  Mildly elevated JVD Cardiac: irregular  3/6 systolic murmur Respiratory: Clear  to auscultation bilaterally. GI: Soft, nontender, distended, normal bowel sounds  MS:  Mild edema; No deformity. Neuro:   Nonfocal  Psych: Oriented and appropriate    Labs    Chemistry Recent Labs  Lab 12/31/23 0525 01/01/24 0527 01/02/24 0315 01/04/24 0759  01/05/24 0245 01/06/24 0235  NA 127* 126*   < > 124* 125* 125*  K 5.0 4.2   < > 4.3 4.1 3.9  CL 93* 95*   < > 88* 91* 93*  CO2 21* 22   < > 24 22 23   GLUCOSE 96 109*   < > 91 85 76  BUN 30* 29*   < > 38* 40* 46*  CREATININE 1.47* 1.39*   < > 1.71* 1.49* 1.58*  CALCIUM 8.9 8.5*   < > 8.6* 8.3* 8.4*  PROT 7.2 6.7  --   --  6.6  --   ALBUMIN 3.2* 2.8*  --   --  2.5*  --   AST 25 27  --   --  29  --   ALT 8 8  --   --  8  --   ALKPHOS 52 51  --   --  63  --   BILITOT 2.6* 2.5*  --   --  2.5*  --   GFRNONAA 36* 39*   < > 30* 36* 33*  ANIONGAP 13 9   < > 12 12 9    < > =  values in this interval not displayed.     Hematology Recent Labs  Lab 01/04/24 0759 01/05/24 0245 01/06/24 0235  WBC 7.6 7.4 6.5  RBC 3.99 3.80* 3.48*  HGB 10.8* 10.1* 9.3*  HCT 31.7* 30.2* 26.8*  MCV 79.4* 79.5* 77.0*  MCH 27.1 26.6 26.7  MCHC 34.1 33.4 34.7  RDW 18.6* 18.7* 18.5*  PLT 113* 118* 131*    Cardiac EnzymesNo results for input(s): TROPONINI in the last 168 hours. No results for input(s): TROPIPOC in the last 168 hours.   BNP No results for input(s): BNP, PROBNP in the last 168 hours.    DDimer No results for input(s): DDIMER in the last 168 hours.   Radiology    No results found.    Cardiac Studies   Echo above  Patient Profile     78 y.o. 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    Echocardiogram shows EF 55 to 60%, moderately reduced RV function with severe enlargement, severely elevated pulmonary pressures (RVSP 77 mmHg), severe biatrial enlargement, mechanical mitral valve with mean gradient 8 mmHg, severe tricuspid regurgitation, TAVR valve with moderate to severe aortic stenosis with mean gradient 33 mmHg.   - RHC on 10/13 showed RA 16, RV 80/1, PA 80/31/47, PCWP 20, CI 3.4, PVR 5 Wood units.  Has severe pre and post capillary PH.  Given elevated PVR, likely prohibitive for valve in valve TAVR - Unfortunately with her severe PH and right heart failure, in addition to her cirrhosis, and not a good candidate for valve in valve TAVR and unlikely would be very beneficial as appears predominantly precapillary pulmonary hypertension, prognosis is poor.  Recommend palliative evaluation.  D/w with her cardiologist, Dr Francyne - Held Lasix given worsening renal function with diuresis.  Does have significant JVD on exam but suspect this is due to her severe TR. Renal function improved with stopping IV Lasix.  Transitioned to p.o. torsemide     Mechanical mitral valve:  Stable valve.  Restarted warfarin as no further procedures planned.  Warfarin per pharmacy   AKI on CKD IIIB: Creatinine peaked at 1.71, currently improved at 1.58.   TAVR: Now with moderate to severe aortic stenosis, RHC suggests likely prohibitive for valve in valve TAVR as above  Chronic atrial fib:  On toprol  XL 25 mg daily. Rate is controlled.   Continue warfarin as above  [facility] will sign off.   Medication Recommendations: Toprol -XL 25 mg daily, midodrine 5 mg 3 times daily, spironolactone  25 mg daily, torsemide  40 mg daily,  warfarin per pharmacy Other recommendations (labs, testing, etc): BMET in 1 week Follow up as an outpatient: We will schedule    For questions or updates, please contact [facility] Please consult [email or web address] for contact info under Cardiology/STEMI.    Signed, Lonni LITTIE Nanas, MD  01/06/2024, 10:12 AM

## 2024-01-06 NOTE — Progress Notes (Signed)
 Mobility Specialist Progress Note:    01/06/24 1542  Mobility  Activity Ambulated with assistance  Level of Assistance Contact guard assist, steadying assist  Assistive Device Front wheel walker  Distance Ambulated (ft) 50 ft  Activity Response Tolerated fair  Mobility Referral Yes  Mobility visit 1 Mobility  Mobility Specialist Start Time (ACUTE ONLY) 1542  Mobility Specialist Stop Time (ACUTE ONLY) 1553  Mobility Specialist Time Calculation (min) (ACUTE ONLY) 11 min   Pt pleasant and agreeable to session. Fatigued from earlier session w/ OT but otherwise doing well. Moving and ambulating slowly but well. Returned pt to room w/ all needs met.  Venetia Keel Mobility Specialist Please Neurosurgeon or Rehab Office at [phone]

## 2024-01-06 NOTE — Progress Notes (Signed)
 PROGRESS NOTE  Chelsea Boyer FMW:[id number] DOB: 1945-10-29   PCP: Chrystal Lamarr RAMAN, MD  Patient is from: Home.  Lives with family.    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   CT head without acute finding.  CT abdomen and pelvis showed liver cirrhosis with large volume ascites.  CXR without acute finding.  Patient was started on diuretics and also underwent paracentesis.  Peritoneal fluid negative for SBP.  Cardiology consulted.  TTE with LVEF of 55 to 60%, RVSP of 77, severely elevated PASP, mechanical MVR, severe TR, TAVR valve with moderate to severe stenosis.  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  [name].  CODE STATUS changed to DNR.     Subjective: Seen and examined earlier this morning.  No major events overnight or this morning.  Reports feeling better.  Denies chest pain, shortness of breath, nausea or vomiting.  Denies melena or hematochezia.  Objective: Vitals:   01/05/24 2359 01/06/24 0320 01/06/24 0323 01/06/24 0730  BP: (!) 115/54 (!) 99/51    Pulse: 87 93    Resp: 20 20    Temp: 97.9 F (36.6 C) 97.9 F (36.6 C)  97.8 F (36.6 C)  TempSrc: Oral Oral  Oral  SpO2: 97% 98%    Weight: 66.4 kg  66.4 kg   Height:        Examination:  GENERAL: No apparent distress.  Appears frail. HEENT: MMM.  Vision and hearing grossly intact.  NECK: Supple.  Notable JVD. RESP:  No IWOB.  Fair  aeration bilaterally. CVS: Irregular rhythm.  Normal rate.  Heart sounds normal.  ABD/GI/GU: BS+. Abd soft.  Ascites.  Epigastric hernia-reducible. MSK/EXT:  Moves extremities. No apparent deformity.  1+ BLE edema. SKIN: no apparent skin lesion or wound NEURO: AA.  Oriented x 4.  No apparent focal neuro deficit. PSYCH: Calm. Normal affect.   Consultants:  Cardiology Interventional radiology Palliative medicine  Procedures: 10/10-paracentesis 10/13-RHC  Microbiology summarized: COVID-19, influenza and RSV PCR nonreactive Blood cultures NGTD  Assessment and plan: Acute on chronic HFpEF/RV failure/severe PAH: Presents with anasarca, abdominal distention, altered mental status and fatigue.  TTE as above.  RHC shows elevated pre and postcapillary pulmonary hypertension.  This is complicated by decompensated Chelsea Boyer cirrhosis and valvular disease.  Patient was diuresed with IV Lasix.  In and out incomplete.  Weight trended up from 138 to 147 pounds.  Seems to have significant ascites with 1+ BLE edema.  Blood pressure renal function limiting adequate diuresis.  Poor prognosis. -Palliative consulted after discussion with patient's primary cardiologist. -Continue p.o. torsemide  per cardiology -Strict intake and output, daily weight, renal functions and electrolytes -Palliative following.  CODE STATUS changed to DNR.   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  INR 4.1. -Probably exacerbating RV failure -S/p RHC on 10/13-PVR prohibitive for TAVR. -Coumadin  per pharmacy. -Recheck INR in the morning  Decompensated NASH liver cirrhosis with  ascites -S/p paracentesis 10/10-no evidence of SBP.  Seems to have recurrence of ascites. -Check abdominal ultrasound to assess ascites. -Continue torsemide  per cardiology -Continue lactulose rifaximin  CKD-3B: Some element of  hepatorenal/cardiorenal syndrome. Recent Labs    12/29/23 1537 12/29/23 2005 12/30/23 0639 12/31/23 0525 01/01/24 0527 01/02/24 0315 01/03/24 0240 01/04/24 0759 01/05/24 0245 01/06/24 0235  BUN 32* 31* 28* 30* 29* 30* 34* 38* 40* 46*  CREATININE 1.54* 1.43* 1.38* 1.47* 1.39* 1.43* 1.58* 1.71* 1.49* 1.58*  - Continue monitoring  Hypervolemic hyponatremia: Low but stable - Diuretics as above.   Chronic A-fib: Rate controlled.  On warfarin for AC.  INR 4.1. -Continue Toprol -XL  - Pharmacy to adjust warfarin based on INR   Hypothyroidism -Continue Synthroid   Normocytic anemia: H&H relatively stable.  No overt bleeding. -Continue monitoring   Debility/deconditioning -PT/OT eval-Home health recommended.   Palliative care/goals of care-poor prognosis. -DNI DNR -Palliative following  Severe malnutrition Body mass index is 27.66 kg/m. Nutrition Problem: Severe Malnutrition Etiology: chronic illness (NASH cirrhosis, CHF) Signs/Symptoms: severe fat depletion, severe muscle depletion Interventions: Magic cup (Mighty Shakes)   DVT prophylaxis:  Place TED hose Start: 12/31/23 1303 SCDs Start: 12/30/23 0511  Code Status: DNR Family Communication: None at bedside Level of care: Telemetry Cardiac Status is: Inpatient Remains inpatient appropriate because: Heart failure, decompensated liver cirrhosis with recurrent ascites and supratherapeutic INR   Final disposition: Home with home health   55 minutes with more than 50% spent in reviewing records, counseling patient/family and coordinating care.   Sch Meds:  Scheduled Meds:  feeding supplement  237 mL Oral BID BM   fluticasone  1 spray Each Nare Daily   lactulose  20 g Oral BID   levothyroxine  50 mcg Intravenous Daily   metoprolol  succinate  25 mg Oral Daily   midodrine  5 mg Oral TID WC   rifaximin  550 mg Oral BID   sodium chloride flush  3 mL Intravenous Q12H   spironolactone   25 mg Oral Daily   torsemide    40 mg Oral Daily   Warfarin - Pharmacist Dosing Inpatient   Does not apply q1600   Continuous Infusions: PRN Meds:.HYDROcodone-acetaminophen, ondansetron **OR** ondansetron (ZOFRAN) IV, sodium chloride flush  Antimicrobials: Anti-infectives (From admission, onward)    Start     Dose/Rate Route Frequency Ordered Stop   12/30/23 1030  rifaximin (XIFAXAN) tablet 550 mg        550 mg Oral 2 times daily 12/30/23 1029     12/30/23 0200  doxycycline (VIBRA-TABS) tablet 100 mg  Status:  Discontinued        100 mg Oral Every 12 hours 12/29/23 2347 12/30/23 1244        I have personally reviewed the following labs and images: CBC: Recent Labs  Lab 01/02/24 0315 01/02/24 1421 01/02/24 1425 01/03/24 0240 01/04/24 0759 01/05/24 0245 01/06/24 0235  WBC 8.7  --   --  7.7 7.6 7.4 6.5  HGB 10.3*   < > 11.2* 10.1* 10.8* 10.1* 9.3*  HCT 30.5*   < > 33.0* 30.2* 31.7* 30.2* 26.8*  MCV 79.4*  --   --  80.5 79.4* 79.5* 77.0*  PLT 124*  --   --  112* 113* 118* 131*   < > = values in this interval not displayed.   BMP &GFR Recent Labs  Lab 01/02/24 0315 01/02/24 1421 01/02/24 1425 01/03/24 0240 01/04/24 0759 01/05/24 0245 01/06/24 0235  NA 127*   < > 128* 126*  124* 125* 125*  K 3.9   < > 3.9 4.1 4.3 4.1 3.9  CL 94*  --   --  92* 88* 91* 93*  CO2 21*  --   --  22 24 22 23   GLUCOSE 95  --   --  102* 91 85 76  BUN 30*  --   --  34* 38* 40* 46*  CREATININE 1.43*  --   --  1.58* 1.71* 1.49* 1.58*  CALCIUM 8.4*  --   --  8.2* 8.6* 8.3* 8.4*  MG 1.6*  --   --  2.5* 2.2 2.0 1.9  PHOS  --   --   --  3.4  --   --   --    < > = values in this interval not displayed.   Estimated Creatinine Clearance: 25.2 mL/min (A) (by C-G formula based on SCr of 1.58 mg/dL (H)). Liver & Pancreas: Recent Labs  Lab 12/31/23 0525 01/01/24 0527 01/05/24 0245  AST 25 27 29   ALT 8 8 8   ALKPHOS 52 51 63  BILITOT 2.6* 2.5* 2.5*  PROT 7.2 6.7 6.6  ALBUMIN 3.2* 2.8* 2.5*   No results for input(s):  LIPASE, AMYLASE in the last 168 hours. No results for input(s): AMMONIA in the last 168 hours. Diabetic: No results for input(s): HGBA1C in the last 72 hours. Recent Labs  Lab 12/30/23 1227  GLUCAP 67*   Cardiac Enzymes: No results for input(s): CKTOTAL, CKMB, CKMBINDEX, TROPONINI in the last 168 hours. No results for input(s): PROBNP in the last 8760 hours. Coagulation Profile: Recent Labs  Lab 01/02/24 0315 01/03/24 1013 01/04/24 0759 01/05/24 0245 01/06/24 0235  INR 1.9* 1.7* 1.9* 2.9* 4.1*   Thyroid Function Tests: No results for input(s): TSH, T4TOTAL, FREET4, T3FREE, THYROIDAB in the last 72 hours. Lipid Profile: No results for input(s): CHOL, HDL, LDLCALC, TRIG, CHOLHDL, LDLDIRECT in the last 72 hours. Anemia Panel: No results for input(s): VITAMINB12, FOLATE, FERRITIN, TIBC, IRON, RETICCTPCT in the last 72 hours. Urine analysis:    Component Value Date/Time   COLORURINE YELLOW 12/29/2023 1900   APPEARANCEUR CLEAR 12/29/2023 1900   LABSPEC 1.009 12/29/2023 1900   PHURINE 5.0 12/29/2023 1900   GLUCOSEU NEGATIVE 12/29/2023 1900   HGBUR NEGATIVE 12/29/2023 1900   BILIRUBINUR NEGATIVE 12/29/2023 1900   KETONESUR NEGATIVE 12/29/2023 1900   PROTEINUR NEGATIVE 12/29/2023 1900   NITRITE NEGATIVE 12/29/2023 1900   LEUKOCYTESUR NEGATIVE 12/29/2023 1900   Sepsis Labs: Invalid input(s): PROCALCITONIN, LACTICIDVEN  Microbiology: Recent Results (from the past 240 hours)  Resp panel by RT-PCR (RSV, Flu A&B, Covid) Anterior Nasal Swab     Status: None   Collection Time: 12/29/23  3:37 PM   Specimen: Anterior Nasal Swab  Result Value Ref Range Status   SARS Coronavirus 2 by RT PCR NEGATIVE NEGATIVE Final   Influenza A by PCR NEGATIVE NEGATIVE Final   Influenza B by PCR NEGATIVE NEGATIVE Final    Comment: (NOTE) The Xpert Xpress SARS-CoV-2/FLU/RSV plus assay is intended as an aid in the diagnosis of influenza from  Nasopharyngeal swab specimens and should not be used as a sole basis for treatment. Nasal washings and aspirates are unacceptable for Xpert Xpress SARS-CoV-2/FLU/RSV testing.  Fact Sheet for Patients: BloggerCourse.com  Fact Sheet for Healthcare Providers: SeriousBroker.it  This test is not yet approved or cleared by the [place]  FDA and has been authorized for detection and/or diagnosis of SARS-CoV-2 by FDA under an Emergency Use Authorization (EUA). This EUA will  remain in effect (meaning this test can be used) for the duration of the COVID-19 declaration under Section 564(b)(1) of the Act, 21 U.S.C. section 360bbb-3(b)(1), unless the authorization is terminated or revoked.     Resp Syncytial Virus by PCR NEGATIVE NEGATIVE Final    Comment: (NOTE) Fact Sheet for Patients: BloggerCourse.com  Fact Sheet for Healthcare Providers: SeriousBroker.it  This test is not yet approved or cleared by the [place]  FDA and has been authorized for detection and/or diagnosis of SARS-CoV-2 by FDA under an Emergency Use Authorization (EUA). This EUA will remain in effect (meaning this test can be used) for the duration of the COVID-19 declaration under Section 564(b)(1) of the Act, 21 U.S.C. section 360bbb-3(b)(1), unless the authorization is terminated or revoked.  Performed at [facility], [address] [place], [place] [address]   Urine Culture     Status: None   Collection Time: 12/29/23 10:16 PM   Specimen: Urine, Clean Catch  Result Value Ref Range Status   Specimen Description URINE, CLEAN CATCH  Final   Special Requests NONE  Final   Culture   Final    NO GROWTH Performed at [facility], [address], [place], [place] [address]    Report Status 12/30/2023 FINAL  Final  MRSA Next Gen by PCR, Nasal     Status: None   Collection Time: 12/30/23  2:00 AM    Specimen: Nasal Mucosa; Nasal Swab  Result Value Ref Range Status   MRSA by PCR Next Gen NOT DETECTED NOT DETECTED Final    Comment: (NOTE) The GeneXpert MRSA Assay (FDA approved for NASAL specimens only), is one component of a comprehensive MRSA colonization surveillance program. It is not intended to diagnose MRSA infection nor to guide or monitor treatment for MRSA infections. Test performance is not FDA approved in patients less than 28 years old. Performed at [facility], [address] [place], [place] [address]   Gram stain     Status: None   Collection Time: 12/30/23 11:42 AM   Specimen: Abdomen; Peritoneal Fluid  Result Value Ref Range Status   Specimen Description ABDOMEN  Final   Special Requests PERITONEAL  Final   Gram Stain   Final    NO WBC SEEN NO ORGANISMS SEEN Performed at [facility], [address] [place], [place] [address]    Report Status 12/30/2023 FINAL  Final    Radiology Studies: No results found.    Siren Porrata [employer] [facility]  If 7PM-7AM, please contact night-coverage [email or web address] 01/06/2024, 11:30 AM

## 2024-01-06 NOTE — Progress Notes (Signed)
   01/06/24 0410  Provider Notification  Provider Name/Title Dr. KYM Hurst  Date Provider Notified 01/06/24  Time Provider Notified 0408  Method of Notification Page (secure chat)  Notification Reason Critical Result  Test performed and critical result INR=4.1  Date Critical Result Received 01/06/24  Time Critical Result Received 0401  Provider response Other (Comment) (awaiting call back)

## 2024-01-06 NOTE — Progress Notes (Signed)
 Occupational Therapy Treatment Patient Details Name: Chelsea Boyer MRN: [id number] DOB: [birth year] Today's Date: 01/06/2024   History of present illness 78 y.o F adm 12/29/23 for hyponatremia and volume overload related to cirrhosis. 10/13 radial R/LHC. PMH: cirrhosis, CHF, CKD, hypothyroidism, Afib TAVR   OT comments  Pt progressing towards goals. Session focused on evaluating pt's cog status. Pt completed pillbox test, filling only 1/5 medications correctly. Indicative that pt has poor attention, problem solving, and organization skills. Husband present for session, and encouraged family to closely monitor or manage medications for pt. Pt agreeable to progress functional mobility, only tolerating ~56ft with RW. Continue to recommend HHOT to optimize independence levels. Will continue to follow acutely.       If plan is discharge home, recommend the following:  A little help with walking and/or transfers;A little help with bathing/dressing/bathroom;Assistance with cooking/housework   Equipment Recommendations  None recommended by OT       Precautions / Restrictions Precautions Precautions: Fall Recall of Precautions/Restrictions: Intact Precaution/Restrictions Comments: watch BP; Rt radial cath 10/13 Restrictions Weight Bearing Restrictions Per Provider Order: No       Mobility Bed Mobility Overal bed mobility: Needs Assistance Bed Mobility: Supine to Sit, Sit to Supine     Supine to sit: Min assist, HOB elevated, Used rails Sit to supine: Mod assist, HOB elevated, Used rails   General bed mobility comments: Min assist for trunk to come to sit, mod assist to return legs to bed    Transfers Overall transfer level: Needs assistance Equipment used: Rolling walker (2 wheels) Transfers: Sit to/from Stand Sit to Stand: Min assist           General transfer comment: Cues for hand placement, required min assist to boost to stand     Balance Overall balance assessment:  Needs assistance Sitting-balance support: No upper extremity supported, Feet supported Sitting balance-Leahy Scale: Good     Standing balance support: Bilateral upper extremity supported, During functional activity, Reliant on assistive device for balance Standing balance-Leahy Scale: Poor Standing balance comment: reliant on RW       ADL either performed or assessed with clinical judgement   ADL Overall ADL's : Needs assistance/impaired     Toilet Transfer: Minimal assistance;Rolling walker (2 wheels) Toilet Transfer Details (indicate cue type and reason): Assist to stand         Functional mobility during ADLs: Minimal assistance;Rolling walker (2 wheels) General ADL Comments: Min assist for STS, once in standing CGA for balance    Extremity/Trunk Assessment Upper Extremity Assessment Upper Extremity Assessment: Generalized weakness   Lower Extremity Assessment Lower Extremity Assessment: Defer to PT evaluation        Vision   Vision Assessment?: No apparent visual deficits         Communication Communication Communication: No apparent difficulties   Cognition Arousal: Alert Behavior During Therapy: WFL for tasks assessed/performed Cognition: Cognition impaired     Awareness: Online awareness impaired Memory impairment (select all impairments): Short-term memory, Working memory Attention impairment (select first level of impairment): Sustained attention Executive functioning impairment (select all impairments): Problem solving, Reasoning OT - Cognition Comments: Pillbox test completed, pt only correctly filling out 1/5 medications correctly. Difficulty with self-correcting errors, and problem solving. Husband present encouraged family to complete or closely monitor medications       Following commands: Intact        Cueing   Cueing Techniques: Verbal cues        General Comments Husband present  for session and supportive    Pertinent Vitals/  Pain       Pain Assessment Pain Assessment: 0-10 Pain Score: 5  Pain Location: Rt hip Pain Descriptors / Indicators: Discomfort, Sore Pain Intervention(s): Limited activity within patient's tolerance   Frequency  Min 2X/week        Progress Toward Goals  OT Goals(current goals can now be found in the care plan section)  Progress towards OT goals: Progressing toward goals  Acute Rehab OT Goals Patient Stated Goal: To get better OT Goal Formulation: With patient Time For Goal Achievement: 01/13/24 Potential to Achieve Goals: Good ADL Goals Pt Will Perform Grooming: with modified independence;standing Pt Will Perform Lower Body Dressing: with modified independence;sit to/from stand Pt Will Transfer to Toilet: with modified independence;ambulating;regular height toilet Pt Will Perform Toileting - Clothing Manipulation and hygiene: with modified independence;sit to/from stand  Plan         AM-PAC OT 6 Clicks Daily Activity     Outcome Measure   Help from another person eating meals?: None Help from another person taking care of personal grooming?: A Little Help from another person toileting, which includes using toliet, bedpan, or urinal?: A Little Help from another person bathing (including washing, rinsing, drying)?: A Lot Help from another person to put on and taking off regular upper body clothing?: A Little Help from another person to put on and taking off regular lower body clothing?: A Lot 6 Click Score: 17    End of Session Equipment Utilized During Treatment: Rolling walker (2 wheels)  OT Visit Diagnosis: Unsteadiness on feet (R26.81);Other abnormalities of gait and mobility (R26.89);Muscle weakness (generalized) (M62.81)   Activity Tolerance Patient tolerated treatment well   Patient Left in bed;with call bell/phone within reach;with family/visitor present   Nurse Communication Mobility status        Time: 1415-1455 OT Time Calculation (min): 40  min  Charges: OT General Charges $OT Visit: 1 Visit OT Treatments $Self Care/Home Management : 23-37 mins $Therapeutic Activity: 8-22 mins  Chelsea Boyer, OT  Acute Rehabilitation Services Office [phone] Secure chat preferred    Chelsea Boyer 01/06/2024, 3:42 PM

## 2024-01-06 NOTE — Progress Notes (Signed)
   01/06/24 0814  Spiritual Encounters  Type of Visit Initial  Care provided to: Patient;Family  Conversation partners present during encounter Nurse  Reason for visit Advance directives   This chaplain visited with patient and daughter. Educated them on the need for an Advance Directive and left the AD paperwork with them. They agreed that it is wise to have it in place and will be filling it out today.  Will ask the nurse to page a chaplain to assist in notarizing/finalizing the document.

## 2024-01-07 ENCOUNTER — Inpatient Hospital Stay (HOSPITAL_COMMUNITY)

## 2024-01-07 DIAGNOSIS — I824Y9 Acute embolism and thrombosis of unspecified deep veins of unspecified proximal lower extremity: Secondary | ICD-10-CM | POA: Diagnosis not present

## 2024-01-07 DIAGNOSIS — I5033 Acute on chronic diastolic (congestive) heart failure: Secondary | ICD-10-CM | POA: Diagnosis not present

## 2024-01-07 DIAGNOSIS — E871 Hypo-osmolality and hyponatremia: Secondary | ICD-10-CM | POA: Diagnosis not present

## 2024-01-07 DIAGNOSIS — E039 Hypothyroidism, unspecified: Secondary | ICD-10-CM | POA: Diagnosis not present

## 2024-01-07 LAB — CBC
HCT: 27.1 % — ABNORMAL LOW (ref 36.0–46.0)
Hemoglobin: 9.4 g/dL — ABNORMAL LOW (ref 12.0–15.0)
MCH: 27 pg (ref 26.0–34.0)
MCHC: 34.7 g/dL (ref 30.0–36.0)
MCV: 77.9 fL — ABNORMAL LOW (ref 80.0–100.0)
Platelets: 123 K/uL — ABNORMAL LOW (ref 150–400)
RBC: 3.48 MIL/uL — ABNORMAL LOW (ref 3.87–5.11)
RDW: 19.1 % — ABNORMAL HIGH (ref 11.5–15.5)
WBC: 5.5 K/uL (ref 4.0–10.5)
nRBC: 0 % (ref 0.0–0.2)

## 2024-01-07 LAB — MAGNESIUM: Magnesium: 1.9 mg/dL (ref 1.7–2.4)

## 2024-01-07 LAB — COMPREHENSIVE METABOLIC PANEL WITH GFR
ALT: 9 U/L (ref 0–44)
AST: 34 U/L (ref 15–41)
Albumin: 2.4 g/dL — ABNORMAL LOW (ref 3.5–5.0)
Alkaline Phosphatase: 97 U/L (ref 38–126)
Anion gap: 11 (ref 5–15)
BUN: 47 mg/dL — ABNORMAL HIGH (ref 8–23)
CO2: 25 mmol/L (ref 22–32)
Calcium: 8.5 mg/dL — ABNORMAL LOW (ref 8.9–10.3)
Chloride: 91 mmol/L — ABNORMAL LOW (ref 98–111)
Creatinine, Ser: 1.51 mg/dL — ABNORMAL HIGH (ref 0.44–1.00)
GFR, Estimated: 35 mL/min — ABNORMAL LOW (ref >60)
Glucose, Bld: 85 mg/dL (ref 70–99)
Potassium: 3.6 mmol/L (ref 3.5–5.1)
Sodium: 127 mmol/L — ABNORMAL LOW (ref 135–145)
Total Bilirubin: 2.2 mg/dL — ABNORMAL HIGH (ref 0.0–1.2)
Total Protein: 6.5 g/dL (ref 6.5–8.1)

## 2024-01-07 LAB — PROTIME-INR
INR: 5.1 (ref 0.8–1.2)
Prothrombin Time: 49.4 s — ABNORMAL HIGH (ref 11.4–15.2)

## 2024-01-07 MED ORDER — PHYTONADIONE 5 MG PO TABS
2.5000 mg | ORAL_TABLET | Freq: Once | ORAL | Status: AC
  Administered 2024-01-07: 2.5 mg via ORAL
  Filled 2024-01-07 (×2): qty 1

## 2024-01-07 NOTE — Progress Notes (Signed)
 PHARMACY - ANTICOAGULATION CONSULT NOTE  Pharmacy Consult for Heparin Indication: mechanical mitral valve   No Known Allergies  Patient Measurements: Height: 5' 1 (154.9 cm) Weight: 60.3 kg (132 lb 15 oz) IBW/kg (Calculated) : 47.8 HEPARIN DW (KG): 60.3  Vital Signs: Temp: 97.7 F (36.5 C) (10/18 0900) Temp Source: Oral (10/18 0900) BP: 116/53 (10/18 1035) Pulse Rate: 74 (10/18 1035)  Labs: Recent Labs    01/05/24 0245 01/06/24 0235 01/07/24 0537  HGB 10.1* 9.3* 9.4*  HCT 30.2* 26.8* 27.1*  PLT 118* 131* 123*  LABPROT 31.7* 41.7* 49.4*  INR 2.9* 4.1* 5.1*  HEPARINUNFRC 0.55  --   --   CREATININE 1.49* 1.58* 1.51*   Estimated Creatinine Clearance: 25.2 mL/min (A) (by C-G formula based on SCr of 1.51 mg/dL (H)).  Assessment: Patient is a 78 YO F presenting with abdominal distension, peripheral edema, and fatigue. 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 According to last anticoag clinic note on 9/25, warfarin regimen was one-half 5 mg tablet daily and INR was 3 at that time. Warfarin on hold   Now s/p cath 10/13 and back on warfarin  10/18: INR today was 5.1. MD aware. Vitamin K PO 2.5 mg x1 per MD.    Goal of Therapy:  INR goal 2.5-3.5 Heparin level 0.3-0.7 units/ml Monitor platelets by anticoagulation protocol: Yes   Plan:  -Hold warfarin today -Daily PT/INR  Thank you for allowing pharmacy to be involved with this patient's care.  Mendel Barter, PharmD PGY1 Clinical Pharmacist [facility]  01/07/2024 12:17 PM

## 2024-01-07 NOTE — Progress Notes (Signed)
   01/07/24 9371  Provider Notification  Provider Name/Title Dr. JAYSON Hurst  Date Provider Notified 01/07/24  Time Provider Notified [phone]  Method of Notification Face-to-face  Notification Reason Critical Result (INR 5.1)  Provider response In department  Date of Provider Response 01/07/24  Time of Provider Response [phone]

## 2024-01-07 NOTE — Progress Notes (Signed)
 Mobility Specialist Progress Note:    01/07/24 1200  Mobility  Activity Ambulated with assistance  Level of Assistance Moderate assist, patient does 50-74%  Assistive Device Other (Comment) (HHA)  Distance Ambulated (ft) 10 ft  Activity Response Tolerated fair;RN notified  Mobility Referral Yes  Mobility visit 1 Mobility  Mobility Specialist Start Time (ACUTE ONLY) 1200  Mobility Specialist Stop Time (ACUTE ONLY) 1225  Mobility Specialist Time Calculation (min) (ACUTE ONLY) 25 min   Received pt laying in bed; looked and reacted somewhat lethargic but agreeable to session. C/o of left hip being stiff and in pain. Pt needing min to mod to move EOB and min to stand. Pt taking short steps but able to ambulate around bed to recliner for lunch. Left pt in recliner w/ all needs met. RN notified.   Venetia Keel Mobility Specialist Please Neurosurgeon or Rehab Office at [phone]

## 2024-01-07 NOTE — Plan of Care (Signed)
 Updated patient's daughter over the phone.  Family interested in SNF.  Will have therapy reevaluate patient

## 2024-01-07 NOTE — Progress Notes (Signed)
 PROGRESS NOTE  Chelsea Boyer FMW:[id number] DOB: 24-Jun-1945   PCP: Chrystal Lamarr RAMAN, MD  Patient is from: Home.  Lives with family.    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   CT head without acute finding.  CT abdomen and pelvis showed liver cirrhosis with large volume ascites.  CXR without acute finding.  Patient was started on diuretics and also underwent paracentesis.  Peritoneal fluid negative for SBP.  Cardiology consulted.  TTE with LVEF of 55 to 60%, RVSP of 77, severely elevated PASP, mechanical MVR, severe TR, TAVR valve with moderate to severe stenosis.  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  [name].  CODE STATUS changed to DNR.    [name].  INR supratherapeutic at 5.1 despite holding warfarin.  Plan for paracentesis after INR improves  Subjective: Seen and examined earlier this morning.  No major events overnight or this morning.  No specific complaints.  Denies shortness of breath, chest pain, nausea or vomiting.  Reports having 1 bowel movements yesterday.  Denies blood in stool.  Objective: Vitals:   01/07/24 0735 01/07/24 0900 01/07/24 1035 01/07/24 1245  BP:  (!) 108/47 (!) 116/53   Pulse:   74 87  Resp: 18     Temp: 97.9 F (36.6 C) 97.7 F (36.5 C)    TempSrc: Oral Oral  Oral  SpO2:    100%  Weight:      Height:        Examination:  GENERAL: No  apparent distress.  Appears frail. HEENT: MMM.  Vision and hearing grossly intact.  NECK: Supple.  Notable JVD. RESP:  No IWOB.  Fair aeration bilaterally. CVS: Irregular rhythm.  Normal rate.  Heart sounds normal.  ABD/GI/GU: BS+. Abd soft.  Ascites.  Epigastric hernia-reducible. MSK/EXT:  Moves extremities. No apparent deformity.  1+ BLE edema. SKIN: no apparent skin lesion or wound NEURO: AA.  Oriented x 4.  No apparent focal neuro deficit. PSYCH: Calm. Normal affect.   Consultants:  Cardiology Interventional radiology Palliative medicine  Procedures: 10/10-paracentesis 10/13-RHC  Microbiology summarized: COVID-19, influenza and RSV PCR nonreactive Blood cultures NGTD  Assessment and plan: Acute on chronic HFpEF/RV failure/severe PAH: Presents with anasarca, abdominal distention, altered mental status and fatigue.  TTE as above.  RHC shows elevated pre and postcapillary pulmonary hypertension.  This is complicated by decompensated Hollie cirrhosis and valvular disease.  Patient was diuresed with IV Lasix.  In and out incomplete.  Weight trended up from 138 to 147 pounds.  Seems to have significant ascites with 1+ BLE edema.  BP and Cr limiting adequate diuresis.  Poor prognosis. -Palliative consulted after discussion with patient's primary cardiologist. -Continue p.o. 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  INR trended up to 5.1 -Probably exacerbating RV failure -S/p RHC  on 10/13-PVR prohibitive for TAVR. -Pharmacy holding Coumadin . -P.o. vitamin K 2.5 mg once -Repeat INR in the morning  Decompensated NASH liver cirrhosis with recurrent ascites -S/p paracentesis 10/10-no evidence of SBP.  Seems to have recurrence of ascites. -Limited  abdominal US  with large ascites on 10/17 -Will consult IR for paracentesis once INR therapeutic. -Continue torsemide  per cardiology -Continue lactulose rifaximin  CKD-3B: Some element of hepatorenal/cardiorenal syndrome. Recent Labs    12/29/23 2005 12/30/23 9360 12/31/23 0525 01/01/24 0527 01/02/24 0315 01/03/24 0240 01/04/24 0759 01/05/24 0245 01/06/24 0235 01/07/24 0537  BUN 31* 28* 30* 29* 30* 34* 38* 40* 46* 47*  CREATININE 1.43* 1.38* 1.47* 1.39* 1.43* 1.58* 1.71* 1.49* 1.58* 1.51*  - Continue monitoring  Hypervolemic hyponatremia: Low but stable - Diuretics as above.   Chronic A-fib: Rate controlled.  On warfarin for AC.  INR 5.1. -Continue Toprol -XL  -Pharmacy to adjust warfarin based on INR   Hypothyroidism -Continue Synthroid   Normocytic anemia: H&H relatively stable.  No overt bleeding. -Continue monitoring   Debility/deconditioning -PT/OT eval-Home health recommended.   Palliative care/goals of care-poor prognosis. -DNI DNR -Palliative following  Severe malnutrition Body mass index is 25.12 kg/m. Nutrition Problem: Severe Malnutrition Etiology: chronic illness (NASH cirrhosis, CHF) Signs/Symptoms: severe fat depletion, severe muscle depletion Interventions: Magic cup (Mighty Shakes)   DVT prophylaxis:  Place TED hose Start: 12/31/23 1303 SCDs Start: 12/30/23 0511  Code Status: DNR Family Communication: None at bedside Level of care: Telemetry Cardiac Status is: Inpatient Remains inpatient appropriate because: Heart failure, decompensated liver cirrhosis with recurrent ascites and supratherapeutic INR   Final disposition: Home with home health   55 minutes with more than 50% spent in reviewing records, counseling patient/family and coordinating care.   Sch Meds:  Scheduled Meds:  feeding supplement  237 mL Oral BID BM   fluticasone  1 spray Each Nare Daily   lactulose  20 g Oral BID   levothyroxine  125 mcg Oral Q0600   metoprolol   succinate  25 mg Oral Daily   midodrine  5 mg Oral TID WC   rifaximin  550 mg Oral BID   sodium chloride flush  3 mL Intravenous Q12H   spironolactone   25 mg Oral Daily   torsemide   40 mg Oral Daily   Warfarin - Pharmacist Dosing Inpatient   Does not apply q1600   Continuous Infusions: PRN Meds:.HYDROcodone-acetaminophen, ondansetron **OR** ondansetron (ZOFRAN) IV, sodium chloride flush  Antimicrobials: Anti-infectives (From admission, onward)    Start     Dose/Rate Route Frequency Ordered Stop   12/30/23 1030  rifaximin (XIFAXAN) tablet 550 mg        550 mg Oral 2 times daily 12/30/23 1029     12/30/23 0200  doxycycline (VIBRA-TABS) tablet 100 mg  Status:  Discontinued        100 mg Oral Every 12 hours 12/29/23 2347 12/30/23 1244        I have personally reviewed the following labs and images: CBC: Recent Labs  Lab 01/03/24 0240 01/04/24 0759 01/05/24 0245 01/06/24 0235 01/07/24 0537  WBC 7.7 7.6 7.4 6.5 5.5  HGB 10.1* 10.8* 10.1* 9.3* 9.4*  HCT 30.2* 31.7* 30.2* 26.8* 27.1*  MCV 80.5 79.4* 79.5* 77.0* 77.9*  PLT 112* 113* 118* 131* 123*   BMP &GFR Recent Labs  Lab 01/03/24 0240 01/04/24 0759 01/05/24 0245 01/06/24 0235 01/07/24 0537  NA 126* 124* 125* 125* 127*  K 4.1 4.3 4.1 3.9 3.6  CL 92* 88* 91* 93* 91*  CO2 22 24 22 23 25   GLUCOSE 102* 91 85 76 85  BUN 34* 38* 40* 46* 47*  CREATININE 1.58* 1.71* 1.49* 1.58* 1.51*  CALCIUM 8.2* 8.6* 8.3* 8.4* 8.5*  MG 2.5* 2.2 2.0 1.9 1.9  PHOS 3.4  --   --   --   --    Estimated Creatinine Clearance: 25.2 mL/min (A) (by C-G formula based on SCr of 1.51 mg/dL (H)). Liver & Pancreas: Recent Labs  Lab 01/01/24 0527 01/05/24 0245 01/07/24 0537  AST 27 29 34  ALT 8 8 9   ALKPHOS 51 63 97  BILITOT 2.5* 2.5* 2.2*  PROT 6.7 6.6 6.5  ALBUMIN 2.8* 2.5* 2.4*   No results for input(s): LIPASE, AMYLASE in the last 168 hours. No results for input(s): AMMONIA in the last 168 hours. Diabetic: No results for  input(s): HGBA1C in the last 72 hours. No results for input(s): GLUCAP in the last 168 hours.  Cardiac Enzymes: No results for input(s): CKTOTAL, CKMB, CKMBINDEX, TROPONINI in the last 168 hours. No results for input(s): PROBNP in the last 8760 hours. Coagulation Profile: Recent Labs  Lab 01/03/24 1013 01/04/24 0759 01/05/24 0245 01/06/24 0235 01/07/24 0537  INR 1.7* 1.9* 2.9* 4.1* 5.1*   Thyroid Function Tests: No results for input(s): TSH, T4TOTAL, FREET4, T3FREE, THYROIDAB in the last 72 hours. Lipid Profile: No results for input(s): CHOL, HDL, LDLCALC, TRIG, CHOLHDL, LDLDIRECT in the last 72 hours. Anemia Panel: No results for input(s): VITAMINB12, FOLATE, FERRITIN, TIBC, IRON, RETICCTPCT in the last 72 hours. Urine analysis:    Component Value Date/Time   COLORURINE YELLOW 12/29/2023 1900   APPEARANCEUR CLEAR 12/29/2023 1900   LABSPEC 1.009 12/29/2023 1900   PHURINE 5.0 12/29/2023 1900   GLUCOSEU NEGATIVE 12/29/2023 1900   HGBUR NEGATIVE 12/29/2023 1900   BILIRUBINUR NEGATIVE 12/29/2023 1900   KETONESUR NEGATIVE 12/29/2023 1900   PROTEINUR NEGATIVE 12/29/2023 1900   NITRITE NEGATIVE 12/29/2023 1900   LEUKOCYTESUR NEGATIVE 12/29/2023 1900   Sepsis Labs: Invalid input(s): PROCALCITONIN, LACTICIDVEN  Microbiology: Recent Results (from the past 240 hours)  Resp panel by RT-PCR (RSV, Flu A&B, Covid) Anterior Nasal Swab     Status: None   Collection Time: 12/29/23  3:37 PM   Specimen: Anterior Nasal Swab  Result Value Ref Range Status   SARS Coronavirus 2 by RT PCR NEGATIVE NEGATIVE Final   Influenza A by PCR NEGATIVE NEGATIVE Final   Influenza B by PCR NEGATIVE NEGATIVE Final    Comment: (NOTE) The Xpert Xpress SARS-CoV-2/FLU/RSV plus assay is intended as an aid in the diagnosis of influenza from Nasopharyngeal swab specimens and should not be used as a sole basis for treatment. Nasal washings and aspirates are  unacceptable for Xpert Xpress SARS-CoV-2/FLU/RSV testing.  Fact Sheet for Patients: BloggerCourse.com  Fact Sheet for Healthcare Providers: SeriousBroker.it  This test is not yet approved or cleared by the [place]  FDA and has been authorized for detection and/or diagnosis of SARS-CoV-2 by FDA under an Emergency Use Authorization (EUA). This EUA will remain in effect (meaning this test can be used) for the duration of the COVID-19 declaration under Section 564(b)(1) of the Act, 21 U.S.C. section 360bbb-3(b)(1), unless the authorization is terminated or revoked.     Resp Syncytial Virus by PCR NEGATIVE NEGATIVE Final    Comment: (NOTE) Fact Sheet for Patients: BloggerCourse.com  Fact Sheet for Healthcare Providers: SeriousBroker.it  This test is not yet approved or cleared by the [place]  FDA and has been authorized for detection  and/or diagnosis of SARS-CoV-2 by FDA under an Emergency Use Authorization (EUA). This EUA will remain in effect (meaning this test can be used) for the duration of the COVID-19 declaration under Section 564(b)(1) of the Act, 21 U.S.C. section 360bbb-3(b)(1), unless the authorization is terminated or revoked.  Performed at [facility], [address], [place], [place] [address]   Urine Culture     Status: None   Collection Time: 12/29/23 10:16 PM   Specimen: Urine, Clean Catch  Result Value Ref Range Status   Specimen Description URINE, CLEAN CATCH  Final   Special Requests NONE  Final   Culture   Final    NO GROWTH Performed at [facility], [address] [place], [place] [address]    Report Status 12/30/2023 FINAL  Final  MRSA Next Gen by PCR, Nasal     Status: None   Collection Time: 12/30/23  2:00 AM   Specimen: Nasal Mucosa; Nasal Swab  Result Value Ref Range Status   MRSA by PCR Next Gen NOT DETECTED NOT DETECTED  Final    Comment: (NOTE) The GeneXpert MRSA Assay (FDA approved for NASAL specimens only), is one component of a comprehensive MRSA colonization surveillance program. It is not intended to diagnose MRSA infection nor to guide or monitor treatment for MRSA infections. Test performance is not FDA approved in patients less than 64 years old. Performed at [facility], [address] [place], [place] [address]   Gram stain     Status: None   Collection Time: 12/30/23 11:42 AM   Specimen: Abdomen; Peritoneal Fluid  Result Value Ref Range Status   Specimen Description ABDOMEN  Final   Special Requests PERITONEAL  Final   Gram Stain   Final    NO WBC SEEN NO ORGANISMS SEEN Performed at [facility], [address] [place], [place] [address]    Report Status 12/30/2023 FINAL  Final    Radiology Studies: US  Abdomen Limited Result Date: 01/07/2024 EXAM: LIMITED ABDOMINAL ULTRASOUND FOR ASCITES EVALUATION TECHNIQUE: Limited real-time sonography of all 4 quadrants of the abdomen was performed for evaluation of ascites. COMPARISON: CT abdomen and pelvis with iv contrast 12/29/2023. CLINICAL HISTORY: Ascites. FINDINGS: RIGHT UPPER QUADRANT: Large volume ascites is present. LEFT UPPER QUADRANT: Large volume ascites is present. RIGHT LOWER QUADRANT: Large volume ascites is present. LEFT LOWER QUADRANT: Large volume ascites is present. The largest pocket is in this quadrant and measures up to 18 cm craniocaudal and nearly 11 cm in depth. OTHER: Limited visualization of the rest of the abdomen demonstrates no acute abnormality. The visualized liver is nodular and cirrhotic. IMPRESSION: 1. Large volume ascites, largest pocket in the left lower quadrant. 2. Cirrhosis of the liver. Electronically signed by: Francis Quam MD 01/07/2024 06:59 AM EDT RP Workstation: [id number]      Axelle Szwed T. Bayyinah Dukeman [facility]  If 7PM-7AM, please contact night-coverage [email or web address] 01/07/2024, 3:02 PM

## 2024-01-07 NOTE — Plan of Care (Signed)
 Encourage patient to consume more of her meals to improve her appetite.  Problem: Education: Goal: Knowledge of General Education information will improve Description: Including pain rating scale, medication(s)/side effects and non-pharmacologic comfort measures Outcome: Progressing   Problem: Health Behavior/Discharge Planning: Goal: Ability to manage health-related needs will improve Outcome: Progressing   Problem: Clinical Measurements: Goal: Ability to maintain clinical measurements within normal limits will improve Outcome: Progressing Goal: Will remain free from infection Outcome: Progressing   Problem: Activity: Goal: Risk for activity intolerance will decrease Outcome: Progressing   Problem: Coping: Goal: Level of anxiety will decrease Outcome: Progressing   Problem: Pain Managment: Goal: General experience of comfort will improve and/or be controlled Outcome: Progressing   Problem: Safety: Goal: Ability to remain free from injury will improve Outcome: Progressing   Problem: Skin Integrity: Goal: Risk for impaired skin integrity will decrease Outcome: Progressing

## 2024-01-08 ENCOUNTER — Inpatient Hospital Stay (HOSPITAL_COMMUNITY)

## 2024-01-08 DIAGNOSIS — R7989 Other specified abnormal findings of blood chemistry: Secondary | ICD-10-CM | POA: Diagnosis not present

## 2024-01-08 DIAGNOSIS — I5033 Acute on chronic diastolic (congestive) heart failure: Secondary | ICD-10-CM | POA: Diagnosis not present

## 2024-01-08 DIAGNOSIS — E039 Hypothyroidism, unspecified: Secondary | ICD-10-CM | POA: Diagnosis not present

## 2024-01-08 DIAGNOSIS — E875 Hyperkalemia: Secondary | ICD-10-CM | POA: Diagnosis not present

## 2024-01-08 DIAGNOSIS — Z515 Encounter for palliative care: Secondary | ICD-10-CM | POA: Diagnosis not present

## 2024-01-08 DIAGNOSIS — E871 Hypo-osmolality and hyponatremia: Secondary | ICD-10-CM | POA: Diagnosis not present

## 2024-01-08 DIAGNOSIS — I824Y9 Acute embolism and thrombosis of unspecified deep veins of unspecified proximal lower extremity: Secondary | ICD-10-CM | POA: Diagnosis not present

## 2024-01-08 LAB — CBC
HCT: 25.9 % — ABNORMAL LOW (ref 36.0–46.0)
Hemoglobin: 8.9 g/dL — ABNORMAL LOW (ref 12.0–15.0)
MCH: 26.6 pg (ref 26.0–34.0)
MCHC: 34.4 g/dL (ref 30.0–36.0)
MCV: 77.3 fL — ABNORMAL LOW (ref 80.0–100.0)
Platelets: 123 K/uL — ABNORMAL LOW (ref 150–400)
RBC: 3.35 MIL/uL — ABNORMAL LOW (ref 3.87–5.11)
RDW: 18.8 % — ABNORMAL HIGH (ref 11.5–15.5)
WBC: 6 K/uL (ref 4.0–10.5)
nRBC: 0 % (ref 0.0–0.2)

## 2024-01-08 LAB — MAGNESIUM: Magnesium: 1.9 mg/dL (ref 1.7–2.4)

## 2024-01-08 LAB — RENAL FUNCTION PANEL
Albumin: 2.3 g/dL — ABNORMAL LOW (ref 3.5–5.0)
Anion gap: 11 (ref 5–15)
BUN: 49 mg/dL — ABNORMAL HIGH (ref 8–23)
CO2: 24 mmol/L (ref 22–32)
Calcium: 8.2 mg/dL — ABNORMAL LOW (ref 8.9–10.3)
Chloride: 91 mmol/L — ABNORMAL LOW (ref 98–111)
Creatinine, Ser: 1.32 mg/dL — ABNORMAL HIGH (ref 0.44–1.00)
GFR, Estimated: 41 mL/min — ABNORMAL LOW (ref >60)
Glucose, Bld: 87 mg/dL (ref 70–99)
Phosphorus: 2.8 mg/dL (ref 2.5–4.6)
Potassium: 3.8 mmol/L (ref 3.5–5.1)
Sodium: 126 mmol/L — ABNORMAL LOW (ref 135–145)

## 2024-01-08 LAB — PROTIME-INR
INR: 3.4 — ABNORMAL HIGH (ref 0.8–1.2)
Prothrombin Time: 35.9 s — ABNORMAL HIGH (ref 11.4–15.2)

## 2024-01-08 LAB — AMMONIA: Ammonia: 68 umol/L — ABNORMAL HIGH (ref 9–35)

## 2024-01-08 MED ORDER — LACTULOSE 10 GM/15ML PO SOLN
20.0000 g | Freq: Three times a day (TID) | ORAL | Status: DC
  Administered 2024-01-08 – 2024-01-16 (×23): 20 g via ORAL
  Filled 2024-01-08 (×23): qty 30

## 2024-01-08 MED ORDER — ALBUMIN HUMAN 25 % IV SOLN: 25.0000 g | INTRAVENOUS | Status: DC | PRN

## 2024-01-08 MED ORDER — LIDOCAINE HCL (PF) 1 % IJ SOLN
10.0000 mL | Freq: Once | INTRAMUSCULAR | Status: AC
  Administered 2024-01-08: 10 mL

## 2024-01-08 MED ORDER — OXYMETAZOLINE HCL 0.05 % NA SOLN
3.0000 | NASAL | Status: AC | PRN
  Filled 2024-01-08: qty 30

## 2024-01-08 NOTE — Plan of Care (Signed)
  Problem: Clinical Measurements: Goal: Diagnostic test results will improve Outcome: Progressing   Problem: Activity: Goal: Risk for activity intolerance will decrease Outcome: Progressing   Problem: Coping: Goal: Level of anxiety will decrease Outcome: Progressing   Problem: Pain Managment: Goal: General experience of comfort will improve and/or be controlled Outcome: Progressing   Problem: Safety: Goal: Ability to remain free from injury will improve Outcome: Progressing

## 2024-01-08 NOTE — Progress Notes (Signed)
 Mobility Specialist Progress Note:    01/08/24 1521  Mobility  Activity Ambulated with assistance (to restroom)  Level of Assistance Contact guard assist, steadying assist  Assistive Device Front wheel walker  Distance Ambulated (ft) 15 ft  Activity Response Tolerated fair;RN notified (Left hip pain)  Mobility Referral Yes  Mobility visit 1 Mobility  Mobility Specialist Start Time (ACUTE ONLY) 1521  Mobility Specialist Stop Time (ACUTE ONLY) 1531  Mobility Specialist Time Calculation (min) (ACUTE ONLY) 10 min    Received pt working w/ RN asking for assistance to restroom. Left hip causing slight pain but doing better. Pt able to take easier steps using RW. Left pt in restroom instructing to pull cord when ready to return. NT was in room and assisted.  Venetia Keel Mobility Specialist Please Neurosurgeon or Rehab Office at [phone]

## 2024-01-08 NOTE — Progress Notes (Signed)
 PROGRESS NOTE  Chelsea Boyer FMW:[id number] DOB: 1945/12/09   PCP: Chrystal Lamarr RAMAN, MD  Patient is from: Home.  Lives with family.    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   CT head without acute finding.  CT abdomen and pelvis showed liver cirrhosis with large volume ascites.  CXR without acute finding.  Patient was started on diuretics and also underwent paracentesis with removal of 10 L on 10/10.  Peritoneal fluid negative for SBP.  Cardiology consulted.  TTE with LVEF of 55 to 60%, RVSP of 77, severely elevated PASP, mechanical MVR, severe TR, TAVR valve with moderate to severe stenosis.  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  [name].  CODE STATUS changed to DNR.    [name].  Underwent repeat para with removal of 4.3 L on 10/19.   Subjective: Seen and examined earlier this morning.  No major events overnight or this morning.  No specific complaint but seems to have small right epistaxis.  Denies pain or shortness of breath.  No bowel movement yesterday and today.  Denies nausea or vomiting.  Objective: Vitals:   01/08/24 0508 01/08/24 0519 01/08/24 0730 01/08/24 1235  BP: (!) 105/51  (!) 90/53 (!) 93/47  Pulse:   89   Resp: 20  18 18   Temp: 99.3 F (37.4 C)  (!) 97.3 F (36.3 C) 99.3 F (37.4 C)  TempSrc: Oral  Oral Oral  SpO2: 96%  99%   Weight:  60.5 kg     Height:        Examination:  GENERAL: No apparent distress.  Appears frail. HEENT: MMM.  Vision and hearing grossly intact.  NECK: Supple.  Notable JVD. RESP:  No IWOB.  Fair aeration bilaterally. CVS: Irregular rhythm.  Normal rate.  Heart sounds normal.  ABD/GI/GU: BS+. Abd soft.  Ascites.  Epigastric hernia-reducible. MSK/EXT:  Moves extremities. No apparent deformity.  1+ BLE edema. SKIN: no apparent skin lesion or wound NEURO: AA.  Oriented x 4.  No apparent focal neuro deficit. PSYCH: Calm. Normal affect.   Consultants:  Cardiology Interventional radiology Palliative medicine  Procedures: 10/10-paracentesis with removal of 10 L 10/13-RHC 10/19-paracentesis with removal of 4.3 L.  Microbiology summarized: COVID-19, influenza and RSV PCR nonreactive Blood cultures NGTD  Assessment and plan: Acute on chronic HFpEF/RV failure/severe PAH: Presents with anasarca, abdominal distention, altered mental status and fatigue.  TTE as above.  RHC shows elevated pre and postcapillary pulmonary hypertension.  This is complicated by decompensated Hollie cirrhosis and valvular disease.  Patient was diuresed with IV Lasix.  In and out incomplete.  Weight trended up from 138 to 147 pounds.  Seems to have significant ascites with 1+ BLE edema.  BP and Cr limiting adequate diuresis.  Poor prognosis. -Palliative consulted after discussion with patient's primary cardiologist. -Continue p.o. 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   Supratherapeutic INR: INR improved to 3.4 but having epistaxis. -Probably exacerbating RV failure -S/p RHC on 10/13-PVR prohibitive for TAVR. - Hold warfarin today -Repeat INR in the morning  Decompensated NASH liver cirrhosis  with recurrent ascites -S/p paracentesis 10/10-no evidence of SBP.  Repeat paracentesis on 10/19 with removal 4.3 L -Continue torsemide  per cardiology - Increase lactulose to 3 times daily - Continue rifaximin  CKD-3B: Some element of hepatorenal/cardiorenal syndrome.  Stable. Recent Labs    12/30/23 0639 12/31/23 0525 01/01/24 0527 01/02/24 0315 01/03/24 0240 01/04/24 0759 01/05/24 0245 01/06/24 0235 01/07/24 0537 01/08/24 0236  BUN 28* 30* 29* 30* 34* 38* 40* 46* 47* 49*  CREATININE 1.38* 1.47* 1.39* 1.43* 1.58* 1.71* 1.49* 1.58* 1.51* 1.32*  - Continue monitoring  Hypotension: BP remains low. - Continue midodrine  Hypervolemic hyponatremia: Low but stable - Diuretics as above.   Chronic A-fib: Rate controlled.  On warfarin for AC.  INR 5.1. -Continue Toprol -XL  -Pharmacy to adjust warfarin based on INR   Hypothyroidism -Continue Synthroid   Normocytic anemia: H&H relatively stable.  No overt bleeding. -Continue monitoring   Debility/deconditioning: Patient too deconditioned.  Family concerned - PT/OT to reevaluate  Right nose epistaxis: Mild. -Hold warfarin. -Afrin    Palliative care/goals of care-poor prognosis. -DNI DNR -Palliative following  Severe malnutrition Body mass index is 25.2 kg/m. Nutrition Problem: Severe Malnutrition Etiology: chronic illness (NASH cirrhosis, CHF) Signs/Symptoms: severe fat depletion, severe muscle depletion Interventions: Magic cup (Mighty Shakes)   DVT prophylaxis:  Place TED hose Start: 12/31/23 1303 SCDs Start: 12/30/23 0511  Code Status: DNR Family Communication: Updated patient's husband and daughter at bedside Level of care: Telemetry Cardiac Status is: Inpatient Remains inpatient appropriate because: Heart failure, decompensated liver cirrhosis with recurrent ascites and supratherapeutic INR   Final disposition: Home with home health   55 minutes with more than 50% spent in reviewing records, counseling  patient/family and coordinating care.   Sch Meds:  Scheduled Meds:  feeding supplement  237 mL Oral BID BM   fluticasone  1 spray Each Nare Daily   lactulose  20 g Oral TID   levothyroxine  125 mcg Oral Q0600   metoprolol  succinate  25 mg Oral Daily   midodrine  5 mg Oral TID WC   rifaximin  550 mg Oral BID   sodium chloride flush  3 mL Intravenous Q12H   spironolactone   25 mg Oral Daily   torsemide   40 mg Oral Daily   Warfarin - Pharmacist Dosing Inpatient   Does not apply q1600   Continuous Infusions:  albumin human     PRN Meds:.[COMPLETED] US  Paracentesis **AND** albumin human, ondansetron **OR** ondansetron (ZOFRAN) IV, oxymetazoline, sodium chloride flush  Antimicrobials: Anti-infectives (From admission, onward)    Start     Dose/Rate Route Frequency Ordered Stop   12/30/23 1030  rifaximin (XIFAXAN) tablet 550 mg        550 mg Oral 2 times daily 12/30/23 1029     12/30/23 0200  doxycycline (VIBRA-TABS) tablet 100 mg  Status:  Discontinued        100 mg Oral Every 12 hours 12/29/23 2347 12/30/23 1244        I have personally reviewed the following labs and images: CBC: Recent Labs  Lab 01/04/24 0759 01/05/24 0245 01/06/24 0235 01/07/24 0537 01/08/24 0236  WBC 7.6 7.4 6.5 5.5 6.0  HGB 10.8* 10.1* 9.3* 9.4* 8.9*  HCT 31.7* 30.2* 26.8* 27.1* 25.9*  MCV 79.4* 79.5* 77.0* 77.9* 77.3*  PLT 113* 118* 131* 123* 123*   BMP &GFR Recent Labs  Lab 01/03/24 0240 01/04/24 0759 01/05/24 0245 01/06/24 0235 01/07/24 0537 01/08/24 0236  NA 126* 124* 125* 125* 127* 126*  K 4.1 4.3 4.1 3.9 3.6 3.8  CL 92* 88* 91* 93* 91* 91*  CO2 22 24 22 23 25 24   GLUCOSE 102* 91 85 76 85 87  BUN 34* 38* 40* 46* 47* 49*  CREATININE 1.58* 1.71* 1.49* 1.58* 1.51* 1.32*  CALCIUM 8.2* 8.6* 8.3* 8.4* 8.5* 8.2*  MG 2.5* 2.2 2.0 1.9 1.9 1.9  PHOS 3.4  --   --   --   --  2.8   Estimated Creatinine Clearance: 28.9 mL/min (A) (by C-G formula based on SCr of 1.32 mg/dL (H)). Liver &  Pancreas: Recent Labs  Lab 01/05/24 0245 01/07/24 0537 01/08/24 0236  AST 29 34  --   ALT 8 9  --   ALKPHOS 63 97  --   BILITOT 2.5* 2.2*  --   PROT 6.6 6.5  --   ALBUMIN 2.5* 2.4* 2.3*   No results for input(s): LIPASE, AMYLASE in the last 168 hours. Recent Labs  Lab 01/08/24 0236  AMMONIA 68*   Diabetic: No results for input(s): HGBA1C in the last 72 hours. No results for input(s): GLUCAP in the last 168 hours.  Cardiac Enzymes: No results for input(s): CKTOTAL, CKMB, CKMBINDEX, TROPONINI in the last 168 hours. No results for input(s): PROBNP in the last 8760 hours. Coagulation Profile: Recent Labs  Lab 01/04/24 0759 01/05/24 0245 01/06/24 0235 01/07/24 0537 01/08/24 0236  INR 1.9* 2.9* 4.1* 5.1* 3.4*   Thyroid Function Tests: No results for input(s): TSH, T4TOTAL, FREET4, T3FREE, THYROIDAB in the last 72 hours. Lipid Profile: No results for input(s): CHOL, HDL, LDLCALC, TRIG, CHOLHDL, LDLDIRECT in the last 72 hours. Anemia Panel: No results for input(s): VITAMINB12, FOLATE, FERRITIN, TIBC, IRON, RETICCTPCT in the last 72 hours. Urine analysis:    Component Value Date/Time   COLORURINE YELLOW 12/29/2023 1900   APPEARANCEUR CLEAR 12/29/2023 1900   LABSPEC 1.009 12/29/2023 1900   PHURINE 5.0 12/29/2023 1900   GLUCOSEU NEGATIVE 12/29/2023 1900   HGBUR NEGATIVE 12/29/2023 1900   BILIRUBINUR NEGATIVE 12/29/2023 1900   KETONESUR NEGATIVE 12/29/2023 1900   PROTEINUR NEGATIVE 12/29/2023 1900   NITRITE NEGATIVE 12/29/2023 1900   LEUKOCYTESUR NEGATIVE 12/29/2023 1900   Sepsis Labs: Invalid input(s): PROCALCITONIN, LACTICIDVEN  Microbiology: Recent Results (from the past 240 hours)  Resp panel by RT-PCR (RSV, Flu A&B, Covid) Anterior Nasal Swab     Status: None   Collection Time: 12/29/23  3:37 PM   Specimen: Anterior Nasal Swab  Result Value Ref Range Status   SARS Coronavirus 2 by RT PCR NEGATIVE  NEGATIVE Final   Influenza A by PCR NEGATIVE NEGATIVE Final   Influenza B by PCR NEGATIVE NEGATIVE Final    Comment: (NOTE) The Xpert Xpress SARS-CoV-2/FLU/RSV plus assay is intended as an aid in the diagnosis of influenza from Nasopharyngeal swab specimens and should not be used as a sole basis for treatment. Nasal washings and aspirates are unacceptable for Xpert Xpress SARS-CoV-2/FLU/RSV testing.  Fact Sheet for Patients: BloggerCourse.com  Fact Sheet for Healthcare Providers: SeriousBroker.it  This test is not yet approved or cleared by the [place]  FDA and has been authorized for detection and/or diagnosis of SARS-CoV-2 by FDA under an Emergency Use Authorization (EUA). This EUA  will remain in effect (meaning this test can be used) for the duration of the COVID-19 declaration under Section 564(b)(1) of the Act, 21 U.S.C. section 360bbb-3(b)(1), unless the authorization is terminated or revoked.     Resp Syncytial Virus by PCR NEGATIVE NEGATIVE Final    Comment: (NOTE) Fact Sheet for Patients: BloggerCourse.com  Fact Sheet for Healthcare Providers: SeriousBroker.it  This test is not yet approved or cleared by the [place]  FDA and has been authorized for detection and/or diagnosis of SARS-CoV-2 by FDA under an Emergency Use Authorization (EUA). This EUA will remain in effect (meaning this test can be used) for the duration of the COVID-19 declaration under Section 564(b)(1) of the Act, 21 U.S.C. section 360bbb-3(b)(1), unless the authorization is terminated or revoked.  Performed at [facility], [address], [place], [place] [address]   Urine Culture     Status: None   Collection Time: 12/29/23 10:16 PM   Specimen: Urine, Clean Catch  Result Value Ref Range Status   Specimen Description URINE, CLEAN CATCH  Final   Special Requests NONE  Final    Culture   Final    NO GROWTH Performed at [facility], [address] [place], [place] [address]    Report Status 12/30/2023 FINAL  Final  MRSA Next Gen by PCR, Nasal     Status: None   Collection Time: 12/30/23  2:00 AM   Specimen: Nasal Mucosa; Nasal Swab  Result Value Ref Range Status   MRSA by PCR Next Gen NOT DETECTED NOT DETECTED Final    Comment: (NOTE) The GeneXpert MRSA Assay (FDA approved for NASAL specimens only), is one component of a comprehensive MRSA colonization surveillance program. It is not intended to diagnose MRSA infection nor to guide or monitor treatment for MRSA infections. Test performance is not FDA approved in patients less than 38 years old. Performed at [facility], [address], [place], [place] [address]   Gram stain     Status: None   Collection Time: 12/30/23 11:42 AM   Specimen: Abdomen; Peritoneal Fluid  Result Value Ref Range Status   Specimen Description ABDOMEN  Final   Special Requests PERITONEAL  Final   Gram Stain   Final    NO WBC SEEN NO ORGANISMS SEEN Performed at [facility], [address], [place], [place] [address]    Report Status 12/30/2023 FINAL  Final    Radiology Studies: US  Paracentesis Result Date: 01/08/2024 INDICATION: Patient with a history of NASH cirrhosis with recurrent ascites. Therapeutic paracentesis requested. EXAM: ULTRASOUND GUIDED PARACENTESIS MEDICATIONS: 1% lidocaine 10 mL COMPLICATIONS: None immediate. PROCEDURE: Informed written consent was obtained from the patient after a discussion of the risks, benefits and alternatives to treatment. A timeout was performed prior to the initiation of the procedure. Initial ultrasound scanning demonstrates a large amount of ascites within the right lower abdominal quadrant. The right lower abdomen was prepped and draped in the usual sterile fashion. 1% lidocaine was used for local anesthesia. Following this, a 19 gauge, 7-cm, Yueh catheter was  introduced. An ultrasound image was saved for documentation purposes. The paracentesis was performed. The catheter was removed and a dressing was applied. The patient tolerated the procedure well without immediate post procedural complication. Patient received post-procedure intravenous albumin; see nursing notes for details. FINDINGS: A total of approximately 4.3 L of clear yellow fluid was removed. Procedure stopped early due to hypotension. IMPRESSION: Successful ultrasound-guided paracentesis yielding 4.3 liters  of peritoneal fluid. Procedure performed by: Warren Dais, NP PLAN: The patient has required >/=2 paracenteses in a 30 day period and a formal evaluation by the [facility] Interventional Radiology Portal [facility]pert[facility] has been arranged. Electronically Signed   By: Wilkie Lent M.D.   On: 01/08/2024 12:12      Shyane Fossum T. Deigo Alonso [facility]  If 7PM-7AM, please contact night-coverage [email or web address] 01/08/2024, 2:10 PM

## 2024-01-08 NOTE — Procedures (Signed)
 PROCEDURE SUMMARY:  Successful ultrasound guided paracentesis from the right lower quadrant.  Yielded 4.3 L of clear yellow fluid.  No immediate complications.  The patient tolerated the procedure well.   Specimen not sent for labs.  EBL < 2 mL  The patient has required >/=2 paracenteses in a 30 day period and a screening evaluation by the [facility] Interventional Radiology Portal Hypertension Clinic has been arranged.  Warren Dais, AGACNP-BC 01/08/2024, 12:09 PM

## 2024-01-08 NOTE — Progress Notes (Signed)
 Palliative Medicine Inpatient Follow Up Note   HPI:  78 y.o. female  with past medical history of NASH small esophageal varices , A.fib on coumadin  , , hx of mitral valve replacement on coumadin  , CKD, hypothyrodism, anemia, sp TAVR  admitted on 12/29/2023 with abdominal distention, increased confusion fatigued, increased leg and abd swelling. Worth to note that patient has a history of TAVR and now presents with moderate to severe stenosis of the prosthetic valve, likely worsening right ventricular failure. A right heart catheterization on 10/13 showed pulmonary vascular resistance too high for repeat TAVR.   PMT has been consulted to assist with goals of care conversation. Patient/Family face treatment option decisions, advanced directive decisions and anticipatory care needs.    Family face treatment option decision, advance directive decisions and anticipatory care needs.    Today's Discussion 01/08/2024  *Please note that this is a verbal dictation therefore any spelling or grammatical errors are due to the Dragon Medical One system interpretation.  Chart reviewed inclusive of vital signs, progress notes, laboratory results, and diagnostic images.   Created space and opportunity for patient to explore thoughts feelings and fears regarding current medical situation. Patient was seen at bedside today; no family members were present.  She was awake and had just finished breakfast. The patient is alert, oriented, and able to express her needs. She reported having had a fair night's sleep and denied any pain or discomfort. Her appetite remains decreased, and she noted limited intake this morning.  She shared that she continues to participate in therapy with the goal of regaining strength to safely return home. The patient mentioned that the medical team plans to remove fluid from her abdomen. I clarified that, based on my understanding, this procedure will be pursued once her INR trends  down to a safer level.  No additional concerns were voiced during the visit. She expressed hope that her family might visit today. Goals of care remain unchanged. We will continue supportive care and treat reversible conditions with the aim of clinical improvement. I understand that the family is exploring discharge options, including short-term SNF placement versus home with home health services. As discussed previously, outpatient palliative services will also be sought.   Patient and her family face treatment option decisions, advanced directive decisions and anticipatory care needs.   Questions and concerns addressed   Palliative Support Provided.   Objective Assessment: Vital Signs Vitals:   01/08/24 0508 01/08/24 0730  BP: (!) 105/51 (!) 90/53  Pulse:  89  Resp: 20 18  Temp: 99.3 F (37.4 C) (!) 97.3 F (36.3 C)  SpO2: 96% 99%    Intake/Output Summary (Last 24 hours) at 01/08/2024 1025 Last data filed at 01/08/2024 9480 Gross per 24 hour  Intake 460 ml  Output 650 ml  Net -190 ml   Last Weight  Most recent update: 01/08/2024  5:20 AM    Weight  60.5 kg (133 lb 6.1 oz)             Exam: Gen Exam:Alert awake-not in any distress, generalized weakness HEENT: atraumatic, normocephalic Chest: B/L clear to auscultation anteriorly CVS:HR 79 Abdomen:distended Extremities:no edema Neurology: Non focal Skin: no rash  SUMMARY OF RECOMMENDATIONS    Code Status: Maintain DNR-Limited Continue current scope of care Goal of care is medical stabilization and recovery to the extent this is possible TOC consult placed for outpatient Palliative services Spiritual care consult to assist with advance directive creation (consult placed 01/05/2024) Continue to provide  psycho-social and emotional support to patient and family Palliative medicine team will continue to follow.    Symptom Management: Per Primary team  Time Spent: 35 minutes  Detailed review of medical  records (labs, imaging, vital signs), medically appropriate exam, discussed with treatment team, counseling and education to patient, family, & staff, documenting clinical information, coordination of care.    ______________________________________________________________________________________ Kathlyne Bolder NP-C [facility] Team Team Cell Phone: [phone] Please utilize secure chat with additional questions, if there is no response within 30 minutes please call the above phone number  Palliative Medicine Team providers are available by phone from 7am to 7pm daily and can be reached through the team cell phone.  Should this patient require assistance outside of these hours, please call the patient's attending physician.

## 2024-01-08 NOTE — Progress Notes (Signed)
 PHARMACY - ANTICOAGULATION CONSULT NOTE  Pharmacy Consult for Heparin Indication: mechanical mitral valve   No Known Allergies  Patient Measurements: Height: 5' 1 (154.9 cm) Weight: 60.5 kg (133 lb 6.1 oz) IBW/kg (Calculated) : 47.8 HEPARIN DW (KG): 60.3  Vital Signs: Temp: 97.3 F (36.3 C) (10/19 0730) Temp Source: Oral (10/19 0730) BP: 90/53 (10/19 0730) Pulse Rate: 89 (10/19 0730)  Labs: Recent Labs    01/06/24 0235 01/07/24 0537 01/08/24 0236  HGB 9.3* 9.4* 8.9*  HCT 26.8* 27.1* 25.9*  PLT 131* 123* 123*  LABPROT 41.7* 49.4* 35.9*  INR 4.1* 5.1* 3.4*  CREATININE 1.58* 1.51* 1.32*   Estimated Creatinine Clearance: 28.9 mL/min (A) (by C-G formula based on SCr of 1.32 mg/dL (H)).  Assessment: Patient is a 78 YO F presenting with abdominal distension, peripheral edema, and fatigue. 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 According to last anticoag clinic note on 9/25, warfarin regimen was 2.5mg  tablet daily and INR was 3 at that time. Warfarin on hold   Now s/p cath 10/13 and back on warfarin  10/19: INR today is 3.4 after vitamin K 2.5 mg PO x1 yesterday. Per MD, holding warfarin today. Of note, patient is also having epistaxis today.   Goal of Therapy:  INR goal 2.5-3.5 Heparin level 0.3-0.7 units/ml Monitor platelets by anticoagulation protocol: Yes   Plan:  -Hold warfarin today -Daily PT/INR  Thank you for allowing pharmacy to be involved with this patient's care.  Mendel Barter, PharmD PGY1 Clinical Pharmacist [facility]  01/08/2024 11:02 AM

## 2024-01-09 ENCOUNTER — Ambulatory Visit: Payer: Self-pay | Admitting: Family

## 2024-01-09 ENCOUNTER — Encounter (HOSPITAL_COMMUNITY): Payer: Self-pay | Admitting: Internal Medicine

## 2024-01-09 DIAGNOSIS — E039 Hypothyroidism, unspecified: Secondary | ICD-10-CM | POA: Diagnosis not present

## 2024-01-09 DIAGNOSIS — I824Y9 Acute embolism and thrombosis of unspecified deep veins of unspecified proximal lower extremity: Secondary | ICD-10-CM | POA: Diagnosis not present

## 2024-01-09 DIAGNOSIS — I5033 Acute on chronic diastolic (congestive) heart failure: Secondary | ICD-10-CM | POA: Diagnosis not present

## 2024-01-09 DIAGNOSIS — E871 Hypo-osmolality and hyponatremia: Secondary | ICD-10-CM | POA: Diagnosis not present

## 2024-01-09 LAB — RENAL FUNCTION PANEL
Albumin: 2.1 g/dL — ABNORMAL LOW (ref 3.5–5.0)
Anion gap: 9 (ref 5–15)
BUN: 44 mg/dL — ABNORMAL HIGH (ref 8–23)
CO2: 23 mmol/L (ref 22–32)
Calcium: 7.8 mg/dL — ABNORMAL LOW (ref 8.9–10.3)
Chloride: 91 mmol/L — ABNORMAL LOW (ref 98–111)
Creatinine, Ser: 1.34 mg/dL — ABNORMAL HIGH (ref 0.44–1.00)
GFR, Estimated: 40 mL/min — ABNORMAL LOW (ref >60)
Glucose, Bld: 88 mg/dL (ref 70–99)
Phosphorus: 2.7 mg/dL (ref 2.5–4.6)
Potassium: 3.8 mmol/L (ref 3.5–5.1)
Sodium: 123 mmol/L — ABNORMAL LOW (ref 135–145)

## 2024-01-09 LAB — AMMONIA: Ammonia: 41 umol/L — ABNORMAL HIGH (ref 9–35)

## 2024-01-09 LAB — CBC
HCT: 26.6 % — ABNORMAL LOW (ref 36.0–46.0)
Hemoglobin: 9.3 g/dL — ABNORMAL LOW (ref 12.0–15.0)
MCH: 27.3 pg (ref 26.0–34.0)
MCHC: 35 g/dL (ref 30.0–36.0)
MCV: 78 fL — ABNORMAL LOW (ref 80.0–100.0)
Platelets: 128 K/uL — ABNORMAL LOW (ref 150–400)
RBC: 3.41 MIL/uL — ABNORMAL LOW (ref 3.87–5.11)
RDW: 18.9 % — ABNORMAL HIGH (ref 11.5–15.5)
WBC: 6.4 K/uL (ref 4.0–10.5)
nRBC: 0 % (ref 0.0–0.2)

## 2024-01-09 LAB — PROTIME-INR
INR: 2.1 — ABNORMAL HIGH (ref 0.8–1.2)
Prothrombin Time: 24.6 s — ABNORMAL HIGH (ref 11.4–15.2)

## 2024-01-09 LAB — MAGNESIUM: Magnesium: 1.7 mg/dL (ref 1.7–2.4)

## 2024-01-09 MED ORDER — WARFARIN SODIUM 5 MG PO TABS
5.0000 mg | ORAL_TABLET | Freq: Once | ORAL | Status: AC
  Administered 2024-01-09: 5 mg via ORAL
  Filled 2024-01-09: qty 1

## 2024-01-09 NOTE — Care Management Important Message (Signed)
 Important Message  Patient Details  Name: Chelsea Boyer MRN: [id number] Date of Birth: Aug 27, 1945   Important Message Given:  Yes - Medicare IM     [name] 01/09/2024, 10:51 AM

## 2024-01-09 NOTE — Progress Notes (Signed)
 Mobility Specialist Progress Note:    01/09/24 1345  Mobility  Activity Ambulated with assistance  Level of Assistance Minimal assist, patient does 75% or more  Assistive Device Front wheel walker  Distance Ambulated (ft) 15 ft  Activity Response Tolerated fair  Mobility Referral Yes  Mobility visit 1 Mobility  Mobility Specialist Start Time (ACUTE ONLY) 1345  Mobility Specialist Stop Time (ACUTE ONLY) 1412  Mobility Specialist Time Calculation (min) (ACUTE ONLY) 27 min   Received pt in recliner; assisted NT in cleaning and ambulating pt. No c/o any symptoms. Pt moving and ambulating somewhat more slowly than previously but otherwise still capable. Returned pt to bed w/ all needs met.   Venetia Keel Mobility Specialist Please Neurosurgeon or Rehab Office at [phone]

## 2024-01-09 NOTE — Progress Notes (Signed)
 PROGRESS NOTE  Chelsea Boyer FMW:[id number] DOB: 1945/11/19   PCP: Chrystal Lamarr RAMAN, MD  Patient is from: Home.  Lives with family.    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   CT head without acute finding.  CT abdomen and pelvis showed liver cirrhosis with large volume ascites.  CXR without acute finding.  Patient was started on diuretics and also underwent paracentesis with removal of 10 L on 10/10.  Peritoneal fluid negative for SBP.  Cardiology consulted.  TTE with LVEF of 55 to 60%, RVSP of 77, severely elevated PASP, mechanical MVR, severe TR, TAVR valve with moderate to severe stenosis.  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  [name].  CODE STATUS changed to DNR.    [name].  Underwent repeat para with removal of 4.3 L on 10/19.  Stable for discharge.  Family interested in short-term rehab.  Therapy and TOC notified.   Subjective: Seen and examined earlier this morning.  No major events overnight or this morning.  Sitting on the edge of the bed eating breakfast.  No complaints.  Objective: Vitals:   01/09/24 0344 01/09/24 0732 01/09/24 0852 01/09/24 1135  BP: (!) 95/45 (!) 104/50 (!) 104/50 (!) 108/39  Pulse: 75  77 70  Resp: 19 20  19   Temp: 99.3 F (37.4 C) 97.9 F (36.6 C)  (!) 97.5 F (36.4 C)  TempSrc: Oral Oral  Oral  SpO2: 94%   90%  Weight: 57.7 kg      Height:        Examination:  GENERAL: No apparent distress.  Appears frail. HEENT: MMM.  Vision and hearing grossly intact.  NECK: Supple.  Notable JVD. RESP:  No IWOB.  Fair aeration bilaterally. CVS: Irregular rhythm.  Normal rate.  Heart sounds normal.  ABD/GI/GU: BS+. Abd soft. Epigastric hernia-reducible. MSK/EXT:  Moves extremities. No apparent deformity.  1+ BLE edema. SKIN: no apparent skin lesion or wound NEURO: AA.  Oriented fairly forgetful.  No apparent focal neuro deficit. PSYCH: Calm. Normal affect.   Consultants:  Cardiology Interventional radiology Palliative medicine  Procedures: 10/10-paracentesis with removal of 10 L 10/13-RHC 10/19-paracentesis with removal of 4.3 L.  Microbiology summarized: COVID-19, influenza and RSV PCR nonreactive Blood cultures NGTD  Assessment and plan: Acute on chronic HFpEF/RV failure/severe PAH: Presents with anasarca, abdominal distention, altered mental status and fatigue.  TTE as above.  RHC shows elevated pre and postcapillary pulmonary hypertension.  This is complicated by decompensated NASH cirrhosis and valvular disease.  Diuresed with IV Lasix.  In and out incomplete.  BP and Cr limiting adequate diuresis.  Poor prognosis. -Continue p.o. 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   Supratherapeutic INR: INR improved to 2.1. -Probably exacerbating RV failure -S/p RHC on 10/13-PVR prohibitive for TAVR. -Warfarin  per pharmacy. 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  Repeat paracentesis on 10/19 with removal 4.3 L -Continue torsemide  per cardiology -Increased lactulose to 3 times daily on  10/19 -Continue rifaximin  CKD-3B: Some element of hepatorenal/cardiorenal syndrome.  Stable. Recent Labs    12/31/23 0525 01/01/24 0527 01/02/24 0315 01/03/24 0240 01/04/24 0759 01/05/24 0245 01/06/24 0235 01/07/24 0537 01/08/24 0236 01/09/24 0249  BUN 30* 29* 30* 34* 38* 40* 46* 47* 49* 44*  CREATININE 1.47* 1.39* 1.43* 1.58* 1.71* 1.49* 1.58* 1.51* 1.32* 1.34*  - Continue monitoring  Hypotension: BP remains low. - Continue midodrine  Hypervolemic hyponatremia: Low but stable - Diuretics as above.   Chronic A-fib: Rate controlled.  On warfarin for AC.  -Continue Toprol -XL  - Anticoagulation as above.   Hypothyroidism -Continue Synthroid   Normocytic anemia: H&H relatively stable.  No overt bleeding. -Continue monitoring   Debility/deconditioning: Patient too deconditioned.  Family interested in short-term rehab. - Therapy and TOC notified.  Right nose epistaxis: Mild.  Resolved.  Thrombocytopenia: Mild and stable. - Continue monitoring   Palliative care/goals of care-poor prognosis. -DNI DNR -Palliative following  Severe malnutrition Body mass index is 24.04 kg/m. Nutrition Problem: Severe Malnutrition Etiology: chronic illness (NASH cirrhosis, CHF) Signs/Symptoms: severe fat depletion, severe muscle depletion Interventions: Magic cup (Mighty Shakes)   DVT prophylaxis:  Place TED hose Start: 12/31/23 1303 SCDs Start: 12/30/23 0511 warfarin (COUMADIN ) tablet 5 mg  Code Status: DNR Family Communication: None at bedside today. Level of care: Telemetry Cardiac Status is: Inpatient Remains inpatient appropriate because: Heart failure, decompensated liver cirrhosis with recurrent ascites and supratherapeutic INR   Final disposition: Home with home health   55 minutes with more than 50% spent in reviewing records, counseling patient/family and coordinating care.   Sch Meds:  Scheduled Meds:  feeding supplement  237 mL Oral BID BM   fluticasone  1  spray Each Nare Daily   lactulose  20 g Oral TID   levothyroxine  125 mcg Oral Q0600   metoprolol  succinate  25 mg Oral Daily   midodrine  5 mg Oral TID WC   rifaximin  550 mg Oral BID   sodium chloride flush  3 mL Intravenous Q12H   spironolactone   25 mg Oral Daily   torsemide   40 mg Oral Daily   warfarin  5 mg Oral ONCE-1600   Warfarin - Pharmacist Dosing Inpatient   Does not apply q1600   Continuous Infusions:  albumin human     PRN Meds:.[COMPLETED] US  Paracentesis **AND** albumin human, ondansetron **OR** ondansetron (ZOFRAN) IV, oxymetazoline, sodium chloride flush  Antimicrobials: Anti-infectives (From admission, onward)    Start     Dose/Rate Route Frequency Ordered Stop   12/30/23 1030  rifaximin (XIFAXAN) tablet 550 mg        550 mg Oral 2 times daily 12/30/23 1029     12/30/23 0200  doxycycline (VIBRA-TABS) tablet 100 mg  Status:  Discontinued        100 mg Oral Every 12 hours 12/29/23 2347 12/30/23 1244        I have personally reviewed the following labs and images: CBC: Recent Labs  Lab 01/05/24 0245 01/06/24 0235 01/07/24 0537 01/08/24 0236 01/09/24 0249  WBC 7.4 6.5 5.5 6.0 6.4  HGB 10.1* 9.3* 9.4* 8.9* 9.3*  HCT 30.2* 26.8* 27.1* 25.9* 26.6*  MCV 79.5* 77.0* 77.9* 77.3* 78.0*  PLT 118* 131* 123* 123* 128*  BMP &GFR Recent Labs  Lab 01/03/24 0240 01/04/24 0759 01/05/24 0245 01/06/24 0235 01/07/24 0537 01/08/24 0236 01/09/24 0249  NA 126*   < > 125* 125* 127* 126* 123*  K 4.1   < > 4.1 3.9 3.6 3.8 3.8  CL 92*   < > 91* 93* 91* 91* 91*  CO2 22   < > 22 23 25 24 23   GLUCOSE 102*   < > 85 76 85 87 88  BUN 34*   < > 40* 46* 47* 49* 44*  CREATININE 1.58*   < > 1.49* 1.58* 1.51* 1.32* 1.34*  CALCIUM 8.2*   < > 8.3* 8.4* 8.5* 8.2* 7.8*  MG 2.5*   < > 2.0 1.9 1.9 1.9 1.7  PHOS 3.4  --   --   --   --  2.8 2.7   < > = values in this interval not displayed.   Estimated Creatinine Clearance: 27.8 mL/min (A) (by C-G formula based on SCr of 1.34  mg/dL (H)). Liver & Pancreas: Recent Labs  Lab 01/05/24 0245 01/07/24 0537 01/08/24 0236 01/09/24 0249  AST 29 34  --   --   ALT 8 9  --   --   ALKPHOS 63 97  --   --   BILITOT 2.5* 2.2*  --   --   PROT 6.6 6.5  --   --   ALBUMIN 2.5* 2.4* 2.3* 2.1*   No results for input(s): LIPASE, AMYLASE in the last 168 hours. Recent Labs  Lab 01/08/24 0236 01/09/24 0249  AMMONIA 68* 41*   Diabetic: No results for input(s): HGBA1C in the last 72 hours. No results for input(s): GLUCAP in the last 168 hours.  Cardiac Enzymes: No results for input(s): CKTOTAL, CKMB, CKMBINDEX, TROPONINI in the last 168 hours. No results for input(s): PROBNP in the last 8760 hours. Coagulation Profile: Recent Labs  Lab 01/05/24 0245 01/06/24 0235 01/07/24 0537 01/08/24 0236 01/09/24 0249  INR 2.9* 4.1* 5.1* 3.4* 2.1*   Thyroid Function Tests: No results for input(s): TSH, T4TOTAL, FREET4, T3FREE, THYROIDAB in the last 72 hours. Lipid Profile: No results for input(s): CHOL, HDL, LDLCALC, TRIG, CHOLHDL, LDLDIRECT in the last 72 hours. Anemia Panel: No results for input(s): VITAMINB12, FOLATE, FERRITIN, TIBC, IRON, RETICCTPCT in the last 72 hours. Urine analysis:    Component Value Date/Time   COLORURINE YELLOW 12/29/2023 1900   APPEARANCEUR CLEAR 12/29/2023 1900   LABSPEC 1.009 12/29/2023 1900   PHURINE 5.0 12/29/2023 1900   GLUCOSEU NEGATIVE 12/29/2023 1900   HGBUR NEGATIVE 12/29/2023 1900   BILIRUBINUR NEGATIVE 12/29/2023 1900   KETONESUR NEGATIVE 12/29/2023 1900   PROTEINUR NEGATIVE 12/29/2023 1900   NITRITE NEGATIVE 12/29/2023 1900   LEUKOCYTESUR NEGATIVE 12/29/2023 1900   Sepsis Labs: Invalid input(s): PROCALCITONIN, LACTICIDVEN  Microbiology: No results found for this or any previous visit (from the past 240 hours).   Radiology Studies: No results found.     Chaise Mahabir T. Telesforo Brosnahan [facility]  If 7PM-7AM, please  contact night-coverage [email or web address] 01/09/2024, 1:57 PM

## 2024-01-09 NOTE — Plan of Care (Signed)
 ?  Problem: Coping: ?Goal: Level of anxiety will decrease ?Outcome: Progressing ?  ?Problem: Safety: ?Goal: Ability to remain free from injury will improve ?Outcome: Progressing ?  ?

## 2024-01-09 NOTE — Progress Notes (Signed)
 This chaplain is present for F/U spiritual care in the setting of creating the Pt. Advance Directive. The Pt. is awake in the bedside recliner and the Pt. husband-James is visiting. Lynwood shared the Pt. daughter is also involved in the Pt. Healthcare decisions. The chaplain understands the Pt. and family are reviewing AD education and will page the chaplain when with ready to proceed with completing an AD.  The chaplain listened reflectively as Lynwood expressed his desire to be ready at the Pt. discharge time. Expressions of feeling overwhelmed entered the conversation when Lynwood discussed the decisions ahead of him. The chaplain understands one piece of ready to mean having a choice of d/c settings and a receiving a recommendation from the healthcare team. The chaplain offered education from [facility] about the Pt. options. This chaplain updated PMT and the RN-LaMelva of the family's questions.  This chaplain is available for F/U spiritual care as needed.  Chaplain Leeroy Hummer [phone]

## 2024-01-09 NOTE — Plan of Care (Signed)
  Problem: Education: Goal: Knowledge of General Education information will improve Description: Including pain rating scale, medication(s)/side effects and non-pharmacologic comfort measures Outcome: Progressing   Problem: Health Behavior/Discharge Planning: Goal: Ability to manage health-related needs will improve Outcome: Progressing   Problem: Clinical Measurements: Goal: Ability to maintain clinical measurements within normal limits will improve Outcome: Progressing Goal: Will remain free from infection Outcome: Progressing Goal: Diagnostic test results will improve Outcome: Progressing Goal: Respiratory complications will improve Outcome: Progressing Goal: Cardiovascular complication will be avoided Outcome: Progressing   Problem: Activity: Goal: Risk for activity intolerance will decrease Outcome: Progressing   Problem: Nutrition: Goal: Adequate nutrition will be maintained Outcome: Progressing   Problem: Coping: Goal: Level of anxiety will decrease Outcome: Progressing   Problem: Elimination: Goal: Will not experience complications related to bowel motility Outcome: Progressing Goal: Will not experience complications related to urinary retention Outcome: Progressing   Problem: Pain Managment: Goal: General experience of comfort will improve and/or be controlled Outcome: Progressing   Problem: Safety: Goal: Ability to remain free from injury will improve Outcome: Progressing   Problem: Skin Integrity: Goal: Risk for impaired skin integrity will decrease Outcome: Progressing   Problem: Education: Goal: Ability to demonstrate management of disease process will improve Outcome: Progressing Goal: Ability to verbalize understanding of medication therapies will improve Outcome: Progressing Goal: Individualized Educational Video(s) Outcome: Progressing   Problem: Activity: Goal: Capacity to carry out activities will improve Outcome: Progressing    Problem: Cardiac: Goal: Ability to achieve and maintain adequate cardiopulmonary perfusion will improve Outcome: Progressing   Problem: Clinical Measurements: Goal: Ability to avoid or minimize complications of infection will improve Outcome: Progressing   Problem: Skin Integrity: Goal: Skin integrity will improve Outcome: Progressing   Problem: Education: Goal: Understanding of CV disease, CV risk reduction, and recovery process will improve Outcome: Progressing Goal: Individualized Educational Video(s) Outcome: Progressing   Problem: Activity: Goal: Ability to return to baseline activity level will improve Outcome: Progressing   Problem: Cardiovascular: Goal: Ability to achieve and maintain adequate cardiovascular perfusion will improve Outcome: Progressing Goal: Vascular access site(s) Level 0-1 will be maintained Outcome: Progressing   Problem: Health Behavior/Discharge Planning: Goal: Ability to safely manage health-related needs after discharge will improve Outcome: Progressing

## 2024-01-09 NOTE — Progress Notes (Addendum)
 PHARMACY - ANTICOAGULATION CONSULT NOTE  Pharmacy Consult for Heparin Indication: mechanical mitral valve   No Known Allergies  Patient Measurements: Height: 5' 1 (154.9 cm) Weight: 57.7 kg (127 lb 3.3 oz) IBW/kg (Calculated) : 47.8 HEPARIN DW (KG): 60.3  Vital Signs: Temp: 97.9 F (36.6 C) (10/20 0732) Temp Source: Oral (10/20 0732) BP: 104/50 (10/20 0852) Pulse Rate: 77 (10/20 0852)  Labs: Recent Labs    01/07/24 0537 01/08/24 0236 01/09/24 0249  HGB 9.4* 8.9* 9.3*  HCT 27.1* 25.9* 26.6*  PLT 123* 123* 128*  LABPROT 49.4* 35.9* 24.6*  INR 5.1* 3.4* 2.1*  CREATININE 1.51* 1.32* 1.34*   Estimated Creatinine Clearance: 27.8 mL/min (A) (by C-G formula based on SCr of 1.34 mg/dL (H)).  Assessment: Patient is a 78 YO F presenting with abdominal distension, peripheral edema, and fatigue. 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 According to last anticoag clinic note on 9/25, warfarin regimen was 2.5mg  tablet daily and INR was 3 at that time. Warfarin on hold   Now s/p cath 10/13 and back on warfarin  -INR today is 2.1 after vitamin K 2.5 mg PO x1 on 10/18.  -recent episode of epistaxis 10/19  Goal of Therapy:  INR goal 2.5-3.5 Heparin level 0.3-0.7 units/ml Monitor platelets by anticoagulation protocol: Yes   Plan:  -Warfarin 5mg  po today -Daily PT/INR -may need to consider IV heparin if INR drops further  Thank you for allowing pharmacy to be involved with this patient's care.  Prentice Poisson, PharmD Clinical Pharmacist **Pharmacist phone directory can now be found on [email or web address] ([email or web address]).  Listed under [facility].

## 2024-01-09 NOTE — Progress Notes (Signed)
 Physical Therapy Treatment Patient Details Name: Chelsea Boyer MRN: [id number] DOB: June 10, 1945 Today's Date: 01/09/2024   History of Present Illness 78 y.o F adm 12/29/23 for hyponatremia and volume overload related to cirrhosis. 10/13 radial R/LHC with RV failure and aortic stenosis. 10/19 paracentesis. PMH: cirrhosis, CHF, CKD, hypothyroidism, Afib TAVR    PT Comments  Pt pleasant with flat affect, increased time to respond to questions and commands and generally slow moving this session. Pt with continued Rt hip pain limiting transfers and gait with education for RW use and progression. Pt with need for BM end of session and in bathroom with nursing staff aware. Will continue to follow.     If plan is discharge home, recommend the following: A little help with walking and/or transfers;A little help with bathing/dressing/bathroom;Assistance with cooking/housework;Assist for transportation;Help with stairs or ramp for entrance   Can travel by private vehicle        Equipment Recommendations  Rolling walker (2 wheels);BSC/3in1;Wheelchair (measurements PT)    Recommendations for Other Services       Precautions / Restrictions Precautions Precautions: Fall Recall of Precautions/Restrictions: Intact Precaution/Restrictions Comments: watch BP     Mobility  Bed Mobility               General bed mobility comments: sitting EOB on arrival    Transfers Overall transfer level: Needs assistance   Transfers: Sit to/from Stand Sit to Stand: Min assist           General transfer comment: Cues for hand placement, required min assist to power up to stand    Ambulation/Gait Ambulation/Gait assistance: Contact guard assist Gait Distance (Feet): 30 Feet Assistive device: Rolling walker (2 wheels) Gait Pattern/deviations: Step-to pattern, Decreased stance time - right   Gait velocity interpretation: <1.31 ft/sec, indicative of household ambulator   General Gait Details:  cues for sequence, use of RW and safety, reliant on RW to off weight RLE slow steps with pt able to walk 30' and stand at sink to brush teeth, pt denied further distance   Stairs             Wheelchair Mobility     Tilt Bed    Modified Rankin (Stroke Patients Only)       Balance Overall balance assessment: Needs assistance Sitting-balance support: No upper extremity supported, Feet supported Sitting balance-Leahy Scale: Good Sitting balance - Comments: EOB without support   Standing balance support: Bilateral upper extremity supported, During functional activity, Reliant on assistive device for balance, No upper extremity supported Standing balance-Leahy Scale: Poor Standing balance comment: reliant on RW, can static stand without UB support                            Communication Communication Communication: No apparent difficulties  Cognition Arousal: Alert Behavior During Therapy: Flat affect   PT - Cognitive impairments: Problem solving                       PT - Cognition Comments: slow processing and movement this session, oriented Following commands: Intact      Cueing Cueing Techniques: Verbal cues  Exercises      General Comments        Pertinent Vitals/Pain Pain Assessment Pain Assessment: 0-10 Pain Score: 5  Pain Location: Rt hip Pain Descriptors / Indicators: Discomfort, Sore Pain Intervention(s): Limited activity within patient's tolerance, Monitored during session, Repositioned, Patient requesting pain meds-RN  notified    Home Living                          Prior Function            PT Goals (current goals can now be found in the care plan section) Progress towards PT goals: Progressing toward goals    Frequency    Min 2X/week      PT Plan      Co-evaluation              AM-PAC PT 6 Clicks Mobility   Outcome Measure  Help needed turning from your back to your side while in a  flat bed without using bedrails?: A Little Help needed moving from lying on your back to sitting on the side of a flat bed without using bedrails?: A Little Help needed moving to and from a bed to a chair (including a wheelchair)?: A Little Help needed standing up from a chair using your arms (e.g., wheelchair or bedside chair)?: A Little Help needed to walk in hospital room?: A Lot Help needed climbing 3-5 steps with a railing? : Total 6 Click Score: 15    End of Session   Activity Tolerance: Patient tolerated treatment well Patient left: with call bell/phone within reach;Other (comment) (on toilet with RN and NT aware) Nurse Communication: Mobility status PT Visit Diagnosis: Unsteadiness on feet (R26.81);Other abnormalities of gait and mobility (R26.89);Muscle weakness (generalized) (M62.81);Difficulty in walking, not elsewhere classified (R26.2)     Time: 9065-8997 PT Time Calculation (min) (ACUTE ONLY): 28 min  Charges:    $Gait Training: 8-22 mins $Therapeutic Activity: 8-22 mins PT General Charges $$ ACUTE PT VISIT: 1 Visit                     Lenoard SQUIBB, PT Acute Rehabilitation Services Office: [phone]    Lenoard NOVAK Adal Sereno 01/09/2024, 10:33 AM

## 2024-01-09 NOTE — TOC Progression Note (Signed)
 Transition of Care [facility]) - Progression Note    Patient Details  Name: Chelsea Boyer MRN: [id number] Date of Birth: 01/18/1946  [facility] CM/SW Contact  Arlana JINNY Nicholaus [place] Phone Number: [phone] 01/09/2024, 1:25 PM  Clinical Narrative:   CSW received a secure chat message from the MD that the patient and family were interested in SNF. CSW met with patient at bedside who stated that she was unfamiliar with that plan would like to go home. CSW notified MD that the patient was setup with HHPT through [facility] and patient was expecting to dc home.   Unit CSW will continue to follow and monitor for dc readiness.                       Expected Discharge Plan and Services                                   HH Arranged: PT HH Agency: [facility] Date [facility] Agency Contacted: 12/30/23   Representative spoke with at [facility] Agency: Darleene   Social Drivers of Health (SDOH) Interventions SDOH Screenings   Food Insecurity: No Food Insecurity (12/30/2023)  Housing: Low Risk  (12/30/2023)  Transportation Needs: No Transportation Needs (12/30/2023)  Utilities: Not At Risk (12/30/2023)  Social Connections: Patient Declined (12/30/2023)  Tobacco Use: Low Risk  (08/09/2023)    Readmission Risk Interventions     No data to display

## 2024-01-10 DIAGNOSIS — E871 Hypo-osmolality and hyponatremia: Secondary | ICD-10-CM | POA: Diagnosis not present

## 2024-01-10 DIAGNOSIS — R7989 Other specified abnormal findings of blood chemistry: Secondary | ICD-10-CM | POA: Diagnosis not present

## 2024-01-10 DIAGNOSIS — R791 Abnormal coagulation profile: Secondary | ICD-10-CM | POA: Diagnosis not present

## 2024-01-10 DIAGNOSIS — E875 Hyperkalemia: Secondary | ICD-10-CM | POA: Diagnosis not present

## 2024-01-10 LAB — COMPREHENSIVE METABOLIC PANEL WITH GFR
ALT: 11 U/L (ref 0–44)
AST: 35 U/L (ref 15–41)
Albumin: 2 g/dL — ABNORMAL LOW (ref 3.5–5.0)
Alkaline Phosphatase: 67 U/L (ref 38–126)
Anion gap: 8 (ref 5–15)
BUN: 40 mg/dL — ABNORMAL HIGH (ref 8–23)
CO2: 24 mmol/L (ref 22–32)
Calcium: 7.9 mg/dL — ABNORMAL LOW (ref 8.9–10.3)
Chloride: 92 mmol/L — ABNORMAL LOW (ref 98–111)
Creatinine, Ser: 1.29 mg/dL — ABNORMAL HIGH (ref 0.44–1.00)
GFR, Estimated: 42 mL/min — ABNORMAL LOW (ref >60)
Glucose, Bld: 90 mg/dL (ref 70–99)
Potassium: 3.9 mmol/L (ref 3.5–5.1)
Sodium: 124 mmol/L — ABNORMAL LOW (ref 135–145)
Total Bilirubin: 2.3 mg/dL — ABNORMAL HIGH (ref 0.0–1.2)
Total Protein: 6 g/dL — ABNORMAL LOW (ref 6.5–8.1)

## 2024-01-10 LAB — CBC
HCT: 27.4 % — ABNORMAL LOW (ref 36.0–46.0)
Hemoglobin: 9.4 g/dL — ABNORMAL LOW (ref 12.0–15.0)
MCH: 26.9 pg (ref 26.0–34.0)
MCHC: 34.3 g/dL (ref 30.0–36.0)
MCV: 78.3 fL — ABNORMAL LOW (ref 80.0–100.0)
Platelets: 149 K/uL — ABNORMAL LOW (ref 150–400)
RBC: 3.5 MIL/uL — ABNORMAL LOW (ref 3.87–5.11)
RDW: 19 % — ABNORMAL HIGH (ref 11.5–15.5)
WBC: 7.6 K/uL (ref 4.0–10.5)
nRBC: 0 % (ref 0.0–0.2)

## 2024-01-10 LAB — PROTIME-INR
INR: 2.1 — ABNORMAL HIGH (ref 0.8–1.2)
Prothrombin Time: 25 s — ABNORMAL HIGH (ref 11.4–15.2)

## 2024-01-10 LAB — MAGNESIUM: Magnesium: 2 mg/dL (ref 1.7–2.4)

## 2024-01-10 MED ORDER — WARFARIN SODIUM 2.5 MG PO TABS
2.5000 mg | ORAL_TABLET | Freq: Once | ORAL | Status: AC
  Administered 2024-01-10: 2.5 mg via ORAL
  Filled 2024-01-10: qty 1

## 2024-01-10 NOTE — Progress Notes (Signed)
 PHARMACY - ANTICOAGULATION CONSULT NOTE  Pharmacy Consult for Heparin Indication: mechanical mitral valve   No Known Allergies  Patient Measurements: Height: 5' 1 (154.9 cm) Weight: 55.3 kg (121 lb 14.6 oz) IBW/kg (Calculated) : 47.8 HEPARIN DW (KG): 60.3  Vital Signs: Temp: 98.1 F (36.7 C) (10/21 0739) Temp Source: Oral (10/21 0739) BP: 107/42 (10/21 0739) Pulse Rate: 88 (10/21 0739)  Labs: Recent Labs    01/08/24 0236 01/09/24 0249 01/10/24 0231  HGB 8.9* 9.3* 9.4*  HCT 25.9* 26.6* 27.4*  PLT 123* 128* 149*  LABPROT 35.9* 24.6* 25.0*  INR 3.4* 2.1* 2.1*  CREATININE 1.32* 1.34* 1.29*   Estimated Creatinine Clearance: 26.7 mL/min (A) (by C-G formula based on SCr of 1.29 mg/dL (H)).  Assessment: Patient is a 78 YO F presenting with abdominal distension, peripheral edema, and fatigue. 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 According to last anticoag clinic note on 9/25, warfarin regimen was 2.5mg  tablet daily and INR was 3 at that time. Warfarin on hold   Now s/p cath 10/13 and back on warfarin  -INR today is 2.1 after vitamin K 2.5 mg PO x1 on 10/18.  -recent episode of epistaxis 10/19  Goal of Therapy:  INR goal 2.5-3.5 Heparin level 0.3-0.7 units/ml Monitor platelets by anticoagulation protocol: Yes   Plan:  -Warfarin 2.5mg  po today -Daily PT/INR  Thank you for allowing pharmacy to be involved with this patient's care.  Prentice Poisson, PharmD Clinical Pharmacist **Pharmacist phone directory can now be found on [email or web address] ([email or web address]).  Listed under [facility].

## 2024-01-10 NOTE — Progress Notes (Signed)
 Patient ID: Chelsea Boyer, female   DOB: Oct 22, 1945, 78 y.o.   MRN: [id number]    Progress Note from the Palliative Medicine Team at [facility]   Patient Name: Chelsea Boyer        Date: 01/10/2024 DOB: 19-Mar-1946  Age: 78 y.o. 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    CT head without acute finding.  CT abdomen and pelvis showed liver cirrhosis with large volume ascites.  CXR without acute finding.  Patient was started on diuretics and also underwent paracentesis with removal of 10 L on 10/10.  Peritoneal fluid negative for SBP.  Cardiology consulted.  TTE with LVEF of 55 to 60%, RVSP of 77, severely elevated PASP, mechanical MVR, severe TR, TAVR valve with moderate to severe stenosis.  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  [name].  CODE STATUS changed to DNR.    [name].  Underwent repeat para with removal of 4.3 L on 10/19.  Stable for discharge.  Family interested in short-term rehab.  Therapy and TOC notified.    Subjective  Extensive chart review has been completed prior to meeting with patient/family  including labs, vital signs, imaging, progress/consult notes, orders, medications and available advance directive documents.    This NP assessed patient at the bedside as a follow up to  yesterday's GOCs  meeting.    Billing based on MDM: ***  {Problems Addressed:304933}  {Amount and/or Complexity of Ijuj:695065}  {Risks:304936}    Education offered today regarding  the importance of continued conversation with family and their  medical providers regarding overall plan of care and treatment options,  ensuring decisions are within the context of the patients values and GOCs.  Questions and concerns addressed   Discussed with primary team and nursing staff   Time:   minutes  Detailed review of medical records ( labs, imaging, vital signs), medically appropriate exam ( MS, skin, cardiac,  resp)   discussed with treatment team, counseling and education to patient, family, staff, documenting clinical information, medication management, coordination of care    Ronal Plants NP  Palliative Medicine Team Team Phone # [phone] Pager [phone]

## 2024-01-10 NOTE — Plan of Care (Signed)
  Problem: Health Behavior/Discharge Planning: Goal: Ability to manage health-related needs will improve Outcome: Progressing   Problem: Clinical Measurements: Goal: Diagnostic test results will improve Outcome: Progressing   Problem: Nutrition: Goal: Adequate nutrition will be maintained Outcome: Progressing   Problem: Elimination: Goal: Will not experience complications related to bowel motility Outcome: Progressing Goal: Will not experience complications related to urinary retention Outcome: Progressing   Problem: Skin Integrity: Goal: Risk for impaired skin integrity will decrease Outcome: Progressing

## 2024-01-10 NOTE — Progress Notes (Signed)
 Physical Therapy Treatment Patient Details Name: KYNEDI PROFITT MRN: [id number] DOB: Jun 15, 1945 Today's Date: 01/10/2024   History of Present Illness 78 y.o F adm 12/29/23 for hyponatremia and volume overload related to cirrhosis. 10/13 radial R/LHC with RV failure and aortic stenosis. 10/19 paracentesis. PMH: cirrhosis, CHF, CKD, hypothyroidism, Afib TAVR    PT Comments  Pt with flat affect but agreeable to mobility. Pt with slow processing, abdominal and Rt hip pain and difficulty with bed mobility requiring mod assist this date. Pt able to walk in hall which is an improvement from prior session and perform HEP. Pt states she would like to go home but is open to rehab. Spoke to spouse on the phone and he expresses concern for being able to care for pt for physical assist. With current waxing and waning of function and assist Patient will benefit from continued inpatient follow up therapy, <3 hours/day if family cannot provide physical assist. Will continue to follow with daily ambulation and mobility encouraged to maintain function.    If plan is discharge home, recommend the following: A little help with walking and/or transfers;A little help with bathing/dressing/bathroom;Assistance with cooking/housework;Assist for transportation;Help with stairs or ramp for entrance   Can travel by private vehicle     Yes  Equipment Recommendations  Rolling walker (2 wheels);BSC/3in1    Recommendations for Other Services OT consult     Precautions / Restrictions Precautions Precautions: Fall Recall of Precautions/Restrictions: Intact     Mobility  Bed Mobility Overal bed mobility: Needs Assistance Bed Mobility: Rolling, Sidelying to Sit Rolling: Contact guard assist, Used rails Sidelying to sit: Mod assist       General bed mobility comments: heavy use of rail to roll to left with mod assist to elevate trunk from surface and multimodal cues with increased time to scoot to EOB     Transfers Overall transfer level: Needs assistance   Transfers: Sit to/from Stand Sit to Stand: Contact guard assist           General transfer comment: pt able to rise from bed, BSC and recliner with UB support and no external assist, cues for hand placement. 5 repeated STS perfomed in 25 sec (>16 sec indicating higher fall risk)    Ambulation/Gait Ambulation/Gait assistance: Contact guard assist Gait Distance (Feet): 120 Feet Assistive device: Rolling walker (2 wheels) Gait Pattern/deviations: Step-through pattern, Decreased stride length   Gait velocity interpretation: <1.8 ft/sec, indicate of risk for recurrent falls   General Gait Details: pt with step through pattern with flexed trunk this session despite continued rt hip pain. pt then performed additional 20' after toileting   Stairs             Wheelchair Mobility     Tilt Bed    Modified Rankin (Stroke Patients Only)       Balance Overall balance assessment: Needs assistance Sitting-balance support: No upper extremity supported, Feet supported Sitting balance-Leahy Scale: Good Sitting balance - Comments: EOB without support   Standing balance support: Bilateral upper extremity supported, During functional activity, Reliant on assistive device for balance Standing balance-Leahy Scale: Poor Standing balance comment: reliant on RW, can static stand without UB support                            Communication Communication Communication: No apparent difficulties  Cognition Arousal: Alert Behavior During Therapy: Flat affect   PT - Cognitive impairments: Problem solving, Orientation  PT - Cognition Comments: slow processing and movement this session, not oriented to day but calendar on wrong day in room        Cueing Cueing Techniques: Verbal cues  Exercises General Exercises - Lower Extremity Long Arc Quad: AROM, Both, Seated, Strengthening, 15  reps Hip Flexion/Marching: Both, Seated, Strengthening, 15 reps, AROM    General Comments        Pertinent Vitals/Pain Pain Assessment Pain Score: 5  Pain Location: Rt hip Pain Descriptors / Indicators: Discomfort, Sore Pain Intervention(s): Limited activity within patient's tolerance, Monitored during session, Repositioned, Patient requesting pain meds-RN notified    Home Living                          Prior Function            PT Goals (current goals can now be found in the care plan section) Progress towards PT goals: Progressing toward goals    Frequency    Min 2X/week      PT Plan      Co-evaluation              AM-PAC PT 6 Clicks Mobility   Outcome Measure  Help needed turning from your back to your side while in a flat bed without using bedrails?: A Little Help needed moving from lying on your back to sitting on the side of a flat bed without using bedrails?: A Lot Help needed moving to and from a bed to a chair (including a wheelchair)?: A Little Help needed standing up from a chair using your arms (e.g., wheelchair or bedside chair)?: A Little Help needed to walk in hospital room?: A Little Help needed climbing 3-5 steps with a railing? : Total 6 Click Score: 15    End of Session   Activity Tolerance: Patient tolerated treatment well Patient left: with call bell/phone within reach;in chair;with chair alarm set Nurse Communication: Mobility status PT Visit Diagnosis: Unsteadiness on feet (R26.81);Other abnormalities of gait and mobility (R26.89);Muscle weakness (generalized) (M62.81);Difficulty in walking, not elsewhere classified (R26.2)     Time: 0915-0950 PT Time Calculation (min) (ACUTE ONLY): 35 min  Charges:    $Gait Training: 8-22 mins $Therapeutic Activity: 8-22 mins PT General Charges $$ ACUTE PT VISIT: 1 Visit                     Lenoard SQUIBB, PT Acute Rehabilitation Services Office: [phone]    Correen Bubolz B  Tanyla Stege 01/10/2024, 11:01 AM

## 2024-01-10 NOTE — NC FL2 (Signed)
 [place]  MEDICAID FL2 LEVEL OF CARE FORM     IDENTIFICATION  Patient Name: Chelsea Boyer: May 24, 1945 Sex: female Admission Date (Current Location): 12/29/2023  [facility] and [place] Number:  Producer, television/film/video and Address:  [facility] [place], 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/2024   Hyperkalemia 12/30/2023   Encephalopathy, hepatic (HCC) 12/30/2023   Hyponatremia 12/29/2023   Cellulitis of right leg 12/29/2023   Physical deconditioning 12/29/2023   Hypothyroidism 12/29/2023   CKD (chronic kidney disease), stage III (HCC) 12/29/2023   Confusion 12/29/2023   Acute on chronic diastolic CHF (congestive heart failure) (HCC) 12/29/2023   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 12/29/2023   Decompensated cirrhosis (HCC) 12/16/2020   Anemia 12/16/2020   Other ascites 12/16/2020   Atrial fibrillation (HCC) 06/25/2020   Acute venous embolism and thrombosis of deep vessels of proximal lower extremity (HCC) 06/25/2020   Chronic anticoagulation 06/25/2020    Orientation RESPIRATION BLADDER Height & Weight     Self, Situation, Time, Place  Normal Continent Weight: 121 lb 14.6 oz (55.3 kg) Height:  5' 1 (154.9 cm)  BEHAVIORAL SYMPTOMS/MOOD NEUROLOGICAL BOWEL NUTRITION STATUS      Continent Diet (Please see discharge summary)   AMBULATORY STATUS COMMUNICATION OF NEEDS Skin   Limited Assist Verbally Normal                       Personal Care Assistance Level of Assistance  Bathing, Dressing, Feeding Bathing Assistance: Limited assistance Feeding assistance: Independent Dressing Assistance: Limited assistance     Functional Limitations Info  Sight, Hearing, Speech Sight Info: Impaired (reading glasses) Hearing Info: Adequate Speech Info: Adequate    SPECIAL CARE FACTORS FREQUENCY  PT (By licensed PT), OT (By licensed OT)     PT Frequency: 5x week OT Frequency: 5x week            Contractures Contractures Info: Not present    Additional Factors Info  Code Status, Allergies Code Status Info: DNR limited Allergies Info: NKA           Current Medications (01/10/2024):  This is the current hospital active medication list Current Facility-Administered Medications  Medication Dose Route Frequency Provider Last Rate Last Admin   albumin human 25 % solution 25 g  25 g Intravenous PRN Gonfa, Taye T, MD       feeding supplement (ENSURE PLUS HIGH PROTEIN) liquid 237 mL  237 mL Oral BID BM Fairy Frames, MD   237 mL at 01/10/24 1104   fluticasone (FLONASE) 50 MCG/ACT nasal spray 1 spray  1 spray Each Nare Daily Raenelle Donalda HERO, MD   1 spray at 01/10/24 1056   lactulose (CHRONULAC) 10 GM/15ML solution 20 g  20 g Oral TID Gonfa, Taye T, MD   20 g at 01/10/24 1039   levothyroxine (  SYNTHROID) tablet 125 mcg  125 mcg Oral Q0600 Gonfa, Taye T, MD   125 mcg at 01/10/24 0559   metoprolol  succinate (TOPROL -XL) 24 hr tablet 25 mg  25 mg Oral Daily Raenelle Donalda HERO, MD   25 mg at 01/09/24 0852   midodrine (PROAMATINE) tablet 5 mg  5 mg Oral TID WC Segars, Jonathan, MD   5 mg at 01/10/24 1313   ondansetron (ZOFRAN) tablet 4 mg  4 mg Oral Q6H PRN Doutova, Anastassia, MD       Or   ondansetron (ZOFRAN) injection 4 mg  4 mg Intravenous Q6H PRN Doutova, Anastassia, MD   4 mg at 12/30/23 2105    oxymetazoline (AFRIN) 0.05 % nasal spray 3 spray  3 spray Right Nare Q2H PRN Gonfa, Taye T, MD       rifaximin (XIFAXAN) tablet 550 mg  550 mg Oral BID Esterwood, Amy S, PA-C   550 mg at 01/10/24 1038   sodium chloride flush (NS) 0.9 % injection 3 mL  3 mL Intravenous Q12H Doutova, Anastassia, MD   3 mL at 01/10/24 1055   sodium chloride flush (NS) 0.9 % injection 3 mL  3 mL Intravenous PRN Doutova, Anastassia, MD       spironolactone  (ALDACTONE ) tablet 25 mg  25 mg Oral Daily O'Neal, Darryle Ned, MD   25 mg at 01/10/24 1038   torsemide  (DEMADEX ) tablet 40 mg  40 mg Oral Daily Kate Lonni CROME, MD   40 mg at 01/10/24 1039   warfarin (COUMADIN ) tablet 2.5 mg  2.5 mg Oral ONCE-1600 Gretel Prentice BIR[facility], [facility]       Warfarin - Pharmacist Dosing Inpatient   Does not apply q1600 Billy Rocky SAUNDERS [facility]   Given at 01/07/24 1801     Discharge Medications: Please see discharge summary for a list of discharge medications.  Relevant Imaging Results:  Relevant Lab Results:   Additional Information SSN [id number]  Luise JAYSON Pan, LCSWA

## 2024-01-10 NOTE — TOC Initial Note (Addendum)
 Transition of Care [facility]) - Initial/Assessment Note    Patient Details  Name: Chelsea Boyer MRN: [id number] Date of Birth: February 03, 1946  [facility] CM/SW Contact:    Luise JAYSON Pan, LCSWA Phone Number: 01/10/2024, 1:17 PM  Clinical Narrative:  CSW spoke with patients daughter about PT rec for SNF. Jennifer ([email or web address]) and patient are agreeable. CSW to fax out referrals and provide bed offers when available.       2:57 PM CSW provided medicare.gov list of accepting bed offers to patient and husband at bedside.    Skilled Nursing Rehab Facilities-   [email or web address]     Ratings out of 5 stars (5 the highest)    Name Address  Phone # Quality Care Staffing Health Inspection Overall  [facility] [address] [place] [phone] 2 1 2 1   [facility] [address] [place] [phone] 5 2 4 5   [facility] [phone] [address], [place] [phone] 3 4 4 4   [facility] Pereyra [address] Arlana Parsley [phone] 2 4 4 4   [facility] (No [employer]) [address] [place], [place] [phone] 1 2 4 3   [facility] [address] [place] [phone] 4 2 1 1    ** = means Nursing homes that have been cited for potential issues related to abuse have the following icon next to their name  CSW will continue to follow.    Expected Discharge Plan: Skilled Nursing Facility Barriers to Discharge: Continued Medical Work up, SNF Pending bed offer   Patient Goals and CMS Choice Patient states their goals for this hospitalization and ongoing recovery are:: to get better          Expected Discharge Plan and Services In-house Referral: Clinical Social Work Discharge Planning Services: CM Consult   Living arrangements for the past 2 months: Skilled Nursing Facility                           HH Arranged: PT [facility] Agency: [facility] Date [facility] Agency Contacted: 12/30/23    Representative spoke with at [facility] Agency: Darleene  Prior Living Arrangements/Services Living arrangements for the past 2 months: Skilled Nursing Facility Lives with:: Spouse Patient language and need for interpreter reviewed:: Yes Do you feel safe going back to the place where you live?: Yes      Need for Family Participation in Patient Care: Yes (Comment) Care giver support system in place?: No (comment) Current home services: DME Criminal Activity/Legal Involvement Pertinent to Current Situation/Hospitalization: No - Comment as needed  Activities of Daily Living   ADL Screening (condition at time of admission) Independently performs ADLs?: Yes (appropriate for developmental age) Is the patient deaf or have difficulty hearing?: No Does the patient have difficulty seeing, even when wearing glasses/contacts?: No Does the patient have difficulty concentrating, remembering, or making decisions?: No  Permission Sought/Granted Permission sought to share information with : Facility Medical sales representative, Family Supports Permission granted to share information with : Yes, Verbal Permission Granted  Share Information with NAME: [name]  Permission granted to share info w AGENCY: SNFs  Permission granted to share info w Relationship: Daughter  Permission granted to share info w Contact Information: [phone]  Emotional Assessment Appearance:: Appears stated age Attitude/Demeanor/Rapport: Engaged Affect (typically observed): Stable Orientation: : Oriented to Self, Oriented to Place, Oriented to  Time, Oriented to Situation Alcohol / Substance Use: Not Applicable Psych Involvement:  No (comment)  Admission diagnosis:  Hyperkalemia [E87.5] Hyponatremia [E87.1] Elevated brain natriuretic peptide (BNP) level [R79.89] Subtherapeutic international normalized ratio (INR) [R79.1] Patient Active Problem List   Diagnosis Date Noted   Protein-calorie malnutrition, severe 01/05/2024   AKI  (acute kidney injury) 01/05/2024   RVF (right ventricular failure) (HCC) 01/03/2024   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/2024   Hyperkalemia 12/30/2023   Encephalopathy, hepatic (HCC) 12/30/2023   Hyponatremia 12/29/2023   Cellulitis of right leg 12/29/2023   Physical deconditioning 12/29/2023   Hypothyroidism 12/29/2023   CKD (chronic kidney disease), stage III (HCC) 12/29/2023   Confusion 12/29/2023   Acute on chronic diastolic CHF (congestive heart failure) (HCC) 12/29/2023   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 AT [address] OF [facility] [address] [place] [address] Phone: [phone] Fax: [phone]     Social Drivers of Health (SDOH) Social History: SDOH Screenings   Food Insecurity: No Food Insecurity (12/30/2023)  Housing: Low Risk  (12/30/2023)  Transportation Needs: No Transportation Needs (12/30/2023)  Utilities: Not At Risk (12/30/2023)  Social Connections: Patient Declined (12/30/2023)  Tobacco Use: Low Risk  (08/09/2023)   SDOH Interventions:     Readmission Risk Interventions     No data to display

## 2024-01-10 NOTE — Progress Notes (Signed)
 PROGRESS NOTE  Chelsea Boyer FMW:[id number] DOB: February 23, 1946   PCP: Chrystal Lamarr RAMAN, MD  Patient is from: Home.  Lives with family.    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   CT head without acute finding.  CT abdomen and pelvis showed liver cirrhosis with large volume ascites.  CXR without acute finding.  Patient was started on diuretics and also underwent paracentesis with removal of 10 L on 10/10.  Peritoneal fluid negative for SBP.  Cardiology consulted.  TTE with LVEF of 55 to 60%, RVSP of 77, severely elevated PASP, mechanical MVR, severe TR, TAVR valve with moderate to severe stenosis.  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  [name].  CODE STATUS changed to DNR.    [name].  Underwent repeat para with removal of 4.3 L on 10/19.  Stable for discharge.  Family interested in short-term rehab.  TOC working on this.  Subjective: Seen and examined earlier this morning.  No major events overnight or this morning.  Sitting on the edge of the bed. No complaints.  Objective: Vitals:   01/10/24 0458 01/10/24 0739 01/10/24 1039 01/10/24 1100  BP: 108/75 (!) 107/42 (!) 98/50 (!) 99/54  Pulse: 83 88 96   Resp: 18 16  20   Temp: 97.8 F (36.6 C) 98.1 F (36.7 C)  97.9 F (36.6 C)  TempSrc: Oral Oral  Oral  SpO2: 98% 99%    Weight: 55.3 kg     Height:         Examination:  GENERAL: No apparent distress.  Appears frail. HEENT: MMM.  Vision and hearing grossly intact.  NECK: Supple.  Notable JVD. RESP:  No IWOB.  Fair aeration bilaterally. CVS: Irregular rhythm.  Normal rate.  Heart sounds normal.  ABD/GI/GU: BS+. Abd soft. Epigastric hernia-reducible. MSK/EXT:  Moves extremities. No apparent deformity.  Trace BLE edema. SKIN: no apparent skin lesion or wound NEURO: AA.  Oriented x 4.  No apparent focal neuro deficit. PSYCH: Calm. Normal affect.   Consultants:  Cardiology Interventional radiology Palliative medicine  Procedures: 10/10-paracentesis with removal of 10 L 10/13-RHC 10/19-paracentesis with removal of 4.3 L.  Microbiology summarized: COVID-19, influenza and RSV PCR nonreactive Blood cultures NGTD  Assessment and plan: Acute on chronic HFpEF/RV failure/severe PAH: Presents with anasarca, abdominal distention, altered mental status and fatigue.  TTE as above.  RHC shows elevated pre and postcapillary pulmonary hypertension.  This is complicated by decompensated NASH cirrhosis and valvular disease.  Diuresed with IV Lasix.  In and out incomplete.  BP and Cr limiting adequate diuresis.  Poor prognosis. -Continue p.o. 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   Supratherapeutic INR: INR improved to 2.1. -Probably exacerbating RV failure -S/p RHC on 10/13-PVR prohibitive for TAVR. -Warfarin per pharmacy. 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  Repeat paracentesis on 10/19 with removal 4.3 L -Continue torsemide  per cardiology -Increased lactulose to 3 times daily on 10/19 -Continue rifaximin  CKD-3B: Some  element of hepatorenal/cardiorenal syndrome.  Stable. Recent Labs    01/01/24 0527 01/02/24 0315 01/03/24 0240 01/04/24 0759 01/05/24 0245 01/06/24 0235 01/07/24 0537 01/08/24 0236 01/09/24 0249 01/10/24 0231  BUN 29* 30* 34* 38* 40* 46* 47* 49* 44* 40*  CREATININE 1.39* 1.43* 1.58* 1.71* 1.49* 1.58* 1.51* 1.32* 1.34* 1.29*  - Continue monitoring  Hypotension: BP improved. - Continue midodrine  Hypervolemic hyponatremia: Low but stable - Diuretics as above.   Chronic A-fib: Rate controlled.  On warfarin for AC.  -Continue Toprol -XL  - Anticoagulation as above.   Hypothyroidism -Continue Synthroid   Normocytic anemia: H&H relatively stable.  No overt bleeding. -Continue monitoring   Debility/deconditioning: Patient too deconditioned.  Family interested in short-term rehab. - Therapy and TOC notified.  Right nose epistaxis: Mild.  Resolved.  Thrombocytopenia: Mild and stable. - Continue monitoring   Palliative care/goals of care-poor prognosis. -DNI DNR -Palliative following  Severe malnutrition Body mass index is 23.04 kg/m. Nutrition Problem: Severe Malnutrition Etiology: chronic illness (NASH cirrhosis, CHF) Signs/Symptoms: severe fat depletion, severe muscle depletion Interventions: Magic cup (Mighty Shakes)   DVT prophylaxis:  Place TED hose Start: 12/31/23 1303 SCDs Start: 12/30/23 0511 warfarin (COUMADIN ) tablet 2.5 mg  Code Status: DNR Family Communication: None at bedside today. Level of care: Telemetry Cardiac Status is: Inpatient Remains inpatient appropriate because: Heart failure, decompensated liver cirrhosis with recurrent ascites and supratherapeutic INR   Final disposition: SNF.   35 minutes with more than 50% spent in reviewing records, counseling patient/family and coordinating care.   Sch Meds:  Scheduled Meds:  feeding supplement  237 mL Oral BID BM   fluticasone  1 spray Each Nare Daily   lactulose  20 g Oral TID    levothyroxine  125 mcg Oral Q0600   metoprolol  succinate  25 mg Oral Daily   midodrine  5 mg Oral TID WC   rifaximin  550 mg Oral BID   sodium chloride flush  3 mL Intravenous Q12H   spironolactone   25 mg Oral Daily   torsemide   40 mg Oral Daily   warfarin  2.5 mg Oral ONCE-1600   Warfarin - Pharmacist Dosing Inpatient   Does not apply q1600   Continuous Infusions:  albumin human     PRN Meds:.[COMPLETED] US  Paracentesis **AND** albumin human, ondansetron **OR** ondansetron (ZOFRAN) IV, oxymetazoline, sodium chloride flush  Antimicrobials: Anti-infectives (From admission, onward)    Start     Dose/Rate Route Frequency Ordered Stop   12/30/23 1030  rifaximin (XIFAXAN) tablet 550 mg        550 mg Oral 2 times daily 12/30/23 1029     12/30/23 0200  doxycycline (VIBRA-TABS) tablet 100 mg  Status:  Discontinued        100 mg Oral Every 12 hours 12/29/23 2347 12/30/23 1244        I have personally reviewed the following labs and images: CBC: Recent Labs  Lab 01/06/24 0235 01/07/24 0537 01/08/24 0236 01/09/24 0249 01/10/24 0231  WBC 6.5 5.5 6.0 6.4 7.6  HGB 9.3* 9.4* 8.9* 9.3* 9.4*  HCT 26.8* 27.1* 25.9* 26.6* 27.4*  MCV 77.0* 77.9* 77.3* 78.0* 78.3*  PLT 131* 123* 123* 128* 149*   BMP &GFR Recent Labs  Lab 01/06/24 0235 01/07/24 0537 01/08/24  0236 01/09/24 0249 01/10/24 0231  NA 125* 127* 126* 123* 124*  K 3.9 3.6 3.8 3.8 3.9  CL 93* 91* 91* 91* 92*  CO2 23 25 24 23 24   GLUCOSE 76 85 87 88 90  BUN 46* 47* 49* 44* 40*  CREATININE 1.58* 1.51* 1.32* 1.34* 1.29*  CALCIUM 8.4* 8.5* 8.2* 7.8* 7.9*  MG 1.9 1.9 1.9 1.7 2.0  PHOS  --   --  2.8 2.7  --    Estimated Creatinine Clearance: 26.7 mL/min (A) (by C-G formula based on SCr of 1.29 mg/dL (H)). Liver & Pancreas: Recent Labs  Lab 01/05/24 0245 01/07/24 0537 01/08/24 0236 01/09/24 0249 01/10/24 0231  AST 29 34  --   --  35  ALT 8 9  --   --  11  ALKPHOS 63 97  --   --  67  BILITOT 2.5* 2.2*  --   --  2.3*   PROT 6.6 6.5  --   --  6.0*  ALBUMIN 2.5* 2.4* 2.3* 2.1* 2.0*   No results for input(s): LIPASE, AMYLASE in the last 168 hours. Recent Labs  Lab 01/08/24 0236 01/09/24 0249  AMMONIA 68* 41*   Diabetic: No results for input(s): HGBA1C in the last 72 hours. No results for input(s): GLUCAP in the last 168 hours.  Cardiac Enzymes: No results for input(s): CKTOTAL, CKMB, CKMBINDEX, TROPONINI in the last 168 hours. No results for input(s): PROBNP in the last 8760 hours. Coagulation Profile: Recent Labs  Lab 01/06/24 0235 01/07/24 0537 01/08/24 0236 01/09/24 0249 01/10/24 0231  INR 4.1* 5.1* 3.4* 2.1* 2.1*   Thyroid Function Tests: No results for input(s): TSH, T4TOTAL, FREET4, T3FREE, THYROIDAB in the last 72 hours. Lipid Profile: No results for input(s): CHOL, HDL, LDLCALC, TRIG, CHOLHDL, LDLDIRECT in the last 72 hours. Anemia Panel: No results for input(s): VITAMINB12, FOLATE, FERRITIN, TIBC, IRON, RETICCTPCT in the last 72 hours. Urine analysis:    Component Value Date/Time   COLORURINE YELLOW 12/29/2023 1900   APPEARANCEUR CLEAR 12/29/2023 1900   LABSPEC 1.009 12/29/2023 1900   PHURINE 5.0 12/29/2023 1900   GLUCOSEU NEGATIVE 12/29/2023 1900   HGBUR NEGATIVE 12/29/2023 1900   BILIRUBINUR NEGATIVE 12/29/2023 1900   KETONESUR NEGATIVE 12/29/2023 1900   PROTEINUR NEGATIVE 12/29/2023 1900   NITRITE NEGATIVE 12/29/2023 1900   LEUKOCYTESUR NEGATIVE 12/29/2023 1900   Sepsis Labs: Invalid input(s): PROCALCITONIN, LACTICIDVEN  Microbiology: No results found for this or any previous visit (from the past 240 hours).   Radiology Studies: No results found.     Parlee Amescua T. Angelia Hazell [facility]  If 7PM-7AM, please contact night-coverage [email or web address] 01/10/2024, 3:51 PM

## 2024-01-10 NOTE — Treatment Plan (Signed)
 Occupational Therapy Treatment Patient Details Name: Chelsea Boyer MRN: [id number] DOB: [birth year] Today's Date: 01/10/2024   History of present illness 78 y.o F adm 12/29/23 for hyponatremia and volume overload related to cirrhosis. 10/13 radial R/LHC with RV failure and aortic stenosis. 10/19 paracentesis. PMH: cirrhosis, CHF, CKD, hypothyroidism, Afib TAVR   OT comments  Pt complete eating with static eating at eob with encouragement of OT. Pt could benefit from magic cup to give more protein for similar food item. Pt tolerated bed mobility and basic transfer attempts this session. Recommendation remains appropriate for skilled inpatient follow up therapy, <3 hours/day.  Call me Landry      If plan is discharge home, recommend the following:  A little help with walking and/or transfers;A little help with bathing/dressing/bathroom;Assistance with cooking/housework   Equipment Recommendations  None recommended by OT    Recommendations for Other Services      Precautions / Restrictions Precautions Precautions: Fall Recall of Precautions/Restrictions: Intact Precaution/Restrictions Comments: watch BP Restrictions Weight Bearing Restrictions Per Provider Order: No       Mobility Bed Mobility Overal bed mobility: Needs Assistance Bed Mobility: Rolling, Supine to Sit, Sit to Supine Rolling: Min assist   Supine to sit: Contact guard Sit to supine: Min assist   General bed mobility comments: pt needs (A) to place bil Le on bed surface. pt needs min cues to sequence task. pt with verbal cues able to roll and push off bed surface with heavy bed rail use throughout. pt with HOB 21 degrees for task    Transfers Overall transfer level: Needs assistance Equipment used: Rolling walker (2 wheels) Transfers: Sit to/from Stand Sit to Stand: Contact guard assist           General transfer comment: pt with (A) to move the RW but side stepping toward HOB. pt power up to scoot back on  bed surface x1 so completed x2 sit<>Stand during session     Balance Overall balance assessment: Needs assistance Sitting-balance support: No upper extremity supported, Feet supported Sitting balance-Leahy Scale: Good     Standing balance support: Bilateral upper extremity supported, During functional activity, Reliant on assistive device for balance Standing balance-Leahy Scale: Poor                             ADL either performed or assessed with clinical judgement   ADL Overall ADL's : Needs assistance/impaired Eating/Feeding: Set up;Sitting Eating/Feeding Details (indicate cue type and reason): encourage to take bites and ate the ice cream and 4 bites of cake. Grooming: Therapist, nutritional;Set up;Sitting                                 General ADL Comments: spouse verbalized concerns for bed mobility so worked on return demo    Extremity/Trunk Assessment Upper Extremity Assessment Upper Extremity Assessment: Defer to OT evaluation   Lower Extremity Assessment Lower Extremity Assessment: Defer to PT evaluation        Vision   Vision Assessment?: No apparent visual deficits   Perception     Praxis     Communication Communication Communication: No apparent difficulties   Cognition Arousal: Alert Behavior During Therapy: Flat affect Cognition: Cognition impaired             OT - Cognition Comments: pt with spouse in room and asking spouse for answers at times during session  Following commands: Intact        Cueing   Cueing Techniques: Verbal cues  Exercises      Shoulder Instructions       General Comments spouse present    Pertinent Vitals/ Pain       Pain Assessment Pain Assessment: No/denies pain  Home Living                                          Prior Functioning/Environment              Frequency  Min 2X/week        Progress Toward Goals  OT Goals(current  goals can now be found in the care plan section)  Progress towards OT goals: Progressing toward goals  Acute Rehab OT Goals Patient Stated Goal: to get rehab OT Goal Formulation: With patient/family Time For Goal Achievement: 01/13/24 Potential to Achieve Goals: Good ADL Goals Pt Will Perform Grooming: with modified independence;standing Pt Will Perform Lower Body Dressing: with modified independence;sit to/from stand Pt Will Transfer to Toilet: with modified independence;ambulating;regular height toilet Pt Will Perform Toileting - Clothing Manipulation and hygiene: with modified independence;sit to/from stand  Plan      Co-evaluation                 AM-PAC OT 6 Clicks Daily Activity     Outcome Measure   Help from another person eating meals?: None Help from another person taking care of personal grooming?: A Little Help from another person toileting, which includes using toliet, bedpan, or urinal?: A Little Help from another person bathing (including washing, rinsing, drying)?: A Lot Help from another person to put on and taking off regular upper body clothing?: A Little Help from another person to put on and taking off regular lower body clothing?: A Lot 6 Click Score: 17    End of Session Equipment Utilized During Treatment: Rolling walker (2 wheels)  OT Visit Diagnosis: Unsteadiness on feet (R26.81);Other abnormalities of gait and mobility (R26.89);Muscle weakness (generalized) (M62.81)   Activity Tolerance Patient tolerated treatment well   Patient Left in bed;with call bell/phone within reach;with family/visitor present   Nurse Communication Mobility status        Time: 1420-1436 OT Time Calculation (min): 16 min  Charges: OT General Charges $OT Visit: 1 Visit OT Treatments $Self Care/Home Management : 8-22 mins   Brynn, OTR/L  Acute Rehabilitation Services Office: [phone] .   Ely Molt 01/10/2024, 3:28 PM

## 2024-01-11 DIAGNOSIS — E871 Hypo-osmolality and hyponatremia: Secondary | ICD-10-CM | POA: Diagnosis not present

## 2024-01-11 LAB — RENAL FUNCTION PANEL
Albumin: 2.2 g/dL — ABNORMAL LOW (ref 3.5–5.0)
Anion gap: 8 (ref 5–15)
BUN: 34 mg/dL — ABNORMAL HIGH (ref 8–23)
CO2: 23 mmol/L (ref 22–32)
Calcium: 7.8 mg/dL — ABNORMAL LOW (ref 8.9–10.3)
Chloride: 92 mmol/L — ABNORMAL LOW (ref 98–111)
Creatinine, Ser: 1.12 mg/dL — ABNORMAL HIGH (ref 0.44–1.00)
GFR, Estimated: 50 mL/min — ABNORMAL LOW (ref >60)
Glucose, Bld: 87 mg/dL (ref 70–99)
Phosphorus: 2.5 mg/dL (ref 2.5–4.6)
Potassium: 4.1 mmol/L (ref 3.5–5.1)
Sodium: 123 mmol/L — ABNORMAL LOW (ref 135–145)

## 2024-01-11 LAB — SODIUM, URINE, RANDOM: Sodium, Ur: 51 mmol/L

## 2024-01-11 LAB — CBC
HCT: 26.7 % — ABNORMAL LOW (ref 36.0–46.0)
Hemoglobin: 9.2 g/dL — ABNORMAL LOW (ref 12.0–15.0)
MCH: 27.3 pg (ref 26.0–34.0)
MCHC: 34.5 g/dL (ref 30.0–36.0)
MCV: 79.2 fL — ABNORMAL LOW (ref 80.0–100.0)
Platelets: 156 K/uL (ref 150–400)
RBC: 3.37 MIL/uL — ABNORMAL LOW (ref 3.87–5.11)
RDW: 19.3 % — ABNORMAL HIGH (ref 11.5–15.5)
WBC: 6.9 K/uL (ref 4.0–10.5)
nRBC: 0 % (ref 0.0–0.2)

## 2024-01-11 LAB — OSMOLALITY, URINE: Osmolality, Ur: 212 mosm/kg — ABNORMAL LOW (ref 300–900)

## 2024-01-11 LAB — PROTIME-INR
INR: 2.7 — ABNORMAL HIGH (ref 0.8–1.2)
Prothrombin Time: 29.6 s — ABNORMAL HIGH (ref 11.4–15.2)

## 2024-01-11 LAB — OSMOLALITY: Osmolality: 269 mosm/kg — ABNORMAL LOW (ref 275–295)

## 2024-01-11 LAB — MAGNESIUM: Magnesium: 1.9 mg/dL (ref 1.7–2.4)

## 2024-01-11 MED ORDER — WARFARIN SODIUM 2.5 MG PO TABS
2.5000 mg | ORAL_TABLET | Freq: Once | ORAL | Status: AC
  Administered 2024-01-11: 2.5 mg via ORAL
  Filled 2024-01-11: qty 1

## 2024-01-11 NOTE — Care Management Important Message (Signed)
 Important Message  Patient Details  Name: Chelsea Boyer MRN: [id number] Date of Birth: 05-Jan-1946   Important Message Given:  Yes - Medicare IM     [name] 01/11/2024, 12:32 PM

## 2024-01-11 NOTE — Progress Notes (Signed)
 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    CT head without acute finding.  CT abdomen and pelvis showed liver cirrhosis with large volume ascites.  CXR without acute finding.  Patient was started on diuretics and also underwent paracentesis with removal of 10 L on 10/10.  Peritoneal fluid negative for SBP.  Cardiology consulted.  TTE with LVEF of 55 to 60%, RVSP of 77, severely elevated PASP, mechanical MVR, severe TR, TAVR valve with moderate to severe stenosis.  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  [name].  CODE STATUS changed to DNR.    [name].  Underwent repeat para with removal of 4.3 L on 10/19.  Stable for discharge.  Family interested in short-term rehab.  TOC working on this.  Assessment & Plan:  Acute on chronic HFpEF/RV failure/severe PAH:  Echocardiogram showing EF of 60% with RVSP of 77, elevated PASP, mechanical MVR, severe TR, TAVR valve with moderate to severe stenosis.  Underwent RHC on 10/13 showing pulmonary hypertension.  Patient has been seen by cardiology team and diuretics have not been adjusted to p.o. - Cardiac medication recommendation-Toprol -XL, midodrine, Aldactone , torsemide  and pharmacy to dose Coumadin .    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  Stable.  Creatinine 1.3    Hypotension: BP improved. - Continue midodrine   Hypervolemic hyponatremia: Low but stable - Diuretics as above.   Chronic A-fib: Rate controlled.  On warfarin for AC.  -Continue Toprol -XL  - Anticoagulation as above.   Hypothyroidism -Continue Synthroid   Normocytic anemia: H&H relatively stable.  No overt bleeding. -Continue monitoring   Debility/deconditioning: Patient too deconditioned.  Family interested in short-term rehab. - Therapy and TOC notified.   Right nose epistaxis: Mild.  Resolved.   Thrombocytopenia: Mild and stable. - Continue monitoring   Palliative care/goals of care-poor prognosis. -DNI DNR -Palliative following   Severe malnutrition Body mass index is 23.04 kg/m. Nutrition Problem: Severe Malnutrition Etiology: chronic illness (NASH cirrhosis, CHF) Signs/Symptoms: severe fat depletion, severe muscle depletion Interventions: Magic cup (Mighty Shakes)   DVT prophylaxis: Place TED hose Start: 12/31/23 1303 SCDs Start: 12/30/23 0511      Code Status: Limited: Do not attempt resuscitation (DNR) -DNR-LIMITED -Do Not Intubate/DNI  Family Communication:   Status is: Inpatient Plans for SNF with palliative care   PT Follow up Recs: Skilled Nursing-Short Term Rehab (<3 Hours/Day)01/10/2024 1100  Subjective:  Seems to doing better today.  Denies any obvious abdominal distention or nausea/vomiting.  Daughter is present at bedside  Examination:  General exam: Appears calm and comfortable, scleral icterus/jaundice Respiratory system: Clear to auscultation. Respiratory effort normal. Cardiovascular system: S1 & S2 heard, RRR. No JVD, murmurs, rubs, gallops or  clicks. No pedal  edema. Gastrointestinal system: Abdomen is nondistended, soft and nontender. No organomegaly or masses felt. Normal bowel sounds heard. Central nervous system: Alert and oriented. No focal neurological deficits. Extremities: Symmetric 5 x 5 power. Skin: No rashes, lesions or ulcers Psychiatry: Judgement and insight appear normal. Mood & affect appropriate.                Diet Orders (From admission, onward)     Start     Ordered   01/02/24 1725  Diet regular Room service appropriate? Yes; Fluid consistency: Thin  Diet effective now       Question Answer Comment  Room service appropriate? Yes   Fluid consistency: Thin      01/02/24 1724            Objective: Vitals:   01/11/24 0400 01/11/24 0500 01/11/24 0837 01/11/24 1137  BP: (!) 93/47  (!) 99/48 (!) 117/49  Pulse: 81  88 78  Resp: 18  18 20   Temp: 98.8 F (37.1 C)  97.9 F (36.6 C) (!) 97.5 F (36.4 C)  TempSrc: Oral  Oral Oral  SpO2: 96%  99% 98%  Weight:  56.1 kg    Height:        Intake/Output Summary (Last 24 hours) at 01/11/2024 1228 Last data filed at 01/11/2024 0853 Gross per 24 hour  Intake 580 ml  Output 2 ml  Net 578 ml   Filed Weights   01/09/24 0344 01/10/24 0458 01/11/24 0500  Weight: 57.7 kg 55.3 kg 56.1 kg    Scheduled Meds:  feeding supplement  237 mL Oral BID BM   fluticasone  1 spray Each Nare Daily   lactulose  20 g Oral TID   levothyroxine  125 mcg Oral Q0600   metoprolol  succinate  25 mg Oral Daily   midodrine  5 mg Oral TID WC   rifaximin  550 mg Oral BID   sodium chloride flush  3 mL Intravenous Q12H   spironolactone   25 mg Oral Daily   torsemide   40 mg Oral Daily   warfarin  2.5 mg Oral ONCE-1600   Warfarin - Pharmacist Dosing Inpatient   Does not apply q1600   Continuous Infusions:  albumin human      Nutritional status Signs/Symptoms: severe fat depletion, severe muscle depletion Interventions: Magic cup (Mighty Shakes) Body mass index is 23.37  kg/m.  Data Reviewed:   CBC: Recent Labs  Lab 01/07/24 0537 01/08/24 0236 01/09/24 0249 01/10/24 0231 01/11/24 0226  WBC 5.5 6.0 6.4 7.6 6.9  HGB 9.4* 8.9* 9.3* 9.4* 9.2*  HCT 27.1* 25.9* 26.6* 27.4* 26.7*  MCV 77.9* 77.3* 78.0* 78.3* 79.2*  PLT 123* 123* 128* 149* 156   Basic Metabolic Panel: Recent Labs  Lab 01/07/24 0537 01/08/24 0236 01/09/24 0249 01/10/24 0231 01/11/24 0226  NA 127* 126* 123* 124* 123*  K 3.6 3.8 3.8 3.9 4.1  CL 91* 91* 91* 92* 92*  CO2 25 24 23 24 23   GLUCOSE 85 87 88 90 87  BUN 47* 49* 44* 40* 34*  CREATININE 1.51* 1.32* 1.34* 1.29* 1.12*  CALCIUM 8.5* 8.2* 7.8* 7.9* 7.8*  MG 1.9 1.9 1.7 2.0 1.9  PHOS  --  2.8 2.7  --  2.5   GFR: Estimated Creatinine Clearance: 30.7 mL/min (A) (by C-G formula based on SCr of 1.12 mg/dL (H)). Liver Function Tests: Recent Labs  Lab 01/05/24 0245 01/07/24 0537 01/08/24 0236 01/09/24 0249 01/10/24 0231 01/11/24 0226  AST 29  34  --   --  35  --   ALT 8 9  --   --  11  --   ALKPHOS 63 97  --   --  67  --   BILITOT 2.5* 2.2*  --   --  2.3*  --   PROT 6.6 6.5  --   --  6.0*  --   ALBUMIN 2.5* 2.4* 2.3* 2.1* 2.0* 2.2*   No results for input(s): LIPASE, AMYLASE in the last 168 hours. Recent Labs  Lab 01/08/24 0236 01/09/24 0249  AMMONIA 68* 41*   Coagulation Profile: Recent Labs  Lab 01/07/24 0537 01/08/24 0236 01/09/24 0249 01/10/24 0231 01/11/24 0226  INR 5.1* 3.4* 2.1* 2.1* 2.7*   Cardiac Enzymes: No results for input(s): CKTOTAL, CKMB, CKMBINDEX, TROPONINI in the last 168 hours. BNP (last 3 results) No results for input(s): PROBNP in the last 8760 hours. HbA1C: No results for input(s): HGBA1C in the last 72 hours. CBG: No results for input(s): GLUCAP in the last 168 hours. Lipid Profile: No results for input(s): CHOL, HDL, LDLCALC, TRIG, CHOLHDL, LDLDIRECT in the last 72 hours. Thyroid Function Tests: No results for input(s): TSH, T4TOTAL, FREET4,  T3FREE, THYROIDAB in the last 72 hours. Anemia Panel: No results for input(s): VITAMINB12, FOLATE, FERRITIN, TIBC, IRON, RETICCTPCT in the last 72 hours. Sepsis Labs: No results for input(s): PROCALCITON, LATICACIDVEN in the last 168 hours.  No results found for this or any previous visit (from the past 240 hours).       Radiology Studies: No results found.         LOS: 12 days   Time spent= 35 mins    Burgess JAYSON Dare, MD [facility]  If 7PM-7AM, please contact night-coverage  01/11/2024, 12:28 PM

## 2024-01-11 NOTE — TOC Progression Note (Signed)
 Transition of Care [facility]) - Progression Note    Patient Details  Name: Chelsea Boyer MRN: [id number] Date of Birth: 1945-09-12  Transition of Care [facility]) CM/SW Contact  Luise JAYSON Pan, [place] Phone Number: 01/11/2024, 11:16 AM  Clinical Narrative:   CSW followed up with patient and [name] (dtr) at bedside about bed choice for SNF. [name] stated the family is interested in [facility] and would like to tour the facility. CSW informed [facility] of family wanting to tour today in the afternoon.   CSW will continue to follow.    Expected Discharge Plan: Skilled Nursing Facility Barriers to Discharge: Continued Medical Work up, SNF Pending bed offer               Expected Discharge Plan and Services In-house Referral: Clinical Social Work Discharge Planning Services: CM Consult   Living arrangements for the past 2 months: Skilled Nursing Facility                           HH Arranged: PT [facility] Agency: [facility] Date [facility] Agency Contacted: 12/30/23   Representative spoke with at [facility] Agency: [facility]   Social Drivers of Health (SDOH) Interventions SDOH Screenings   Food Insecurity: No Food Insecurity (12/30/2023)  Housing: Low Risk  (12/30/2023)  Transportation Needs: No Transportation Needs (12/30/2023)  Utilities: Not At Risk (12/30/2023)  Social Connections: Patient Declined (12/30/2023)  Tobacco Use: Low Risk  (08/09/2023)    Readmission Risk Interventions     No data to display

## 2024-01-11 NOTE — Plan of Care (Signed)
   Problem: Education: Goal: Knowledge of General Education information will improve Description Including pain rating scale, medication(s)/side effects and non-pharmacologic comfort measures Outcome: Progressing   Problem: Health Behavior/Discharge Planning: Goal: Ability to manage health-related needs will improve Outcome: Progressing

## 2024-01-11 NOTE — Hospital Course (Addendum)
 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    CT head without acute finding.  CT abdomen and pelvis showed liver cirrhosis with large volume ascites.  CXR without acute finding.  Patient was started on diuretics and also underwent paracentesis with removal of 10 L on 10/10.  Peritoneal fluid negative for SBP.  Cardiology consulted.  TTE with LVEF of 55 to 60%, RVSP of 77, severely elevated PASP, mechanical MVR, severe TR, TAVR valve with moderate to severe stenosis.  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  [name].  CODE STATUS changed to DNR.    [name].  Underwent repeat para with removal of 4.3 L on 10/19.  Stable for discharge.  Family interested in short-term rehab.  TOC working on this.  Assessment & Plan:  Acute on chronic HFpEF/RV failure/severe PAH:  Echocardiogram showing EF of 60% with RVSP of 77, elevated PASP, mechanical MVR, severe TR, TAVR valve with moderate to severe stenosis.  Underwent RHC on 10/13 showing pulmonary hypertension.  Patient has been seen by cardiology team and diuretics have not been adjusted to p.o. - Cardiac medication recommendation-Toprol -XL, midodrine, Aldactone , torsemide  and pharmacy to dose Coumadin .    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  Stable.  Creatinine 1.3    Hypotension: BP improved. - Continue midodrine   Hypervolemic hyponatremia: Low but stable - Diuretics as above.   Chronic A-fib: Rate controlled.  On warfarin for AC.  -Continue Toprol -XL  - Anticoagulation as above.   Hypothyroidism -Continue Synthroid   Normocytic anemia: H&H relatively stable.  No overt bleeding. -Continue monitoring   Debility/deconditioning: Patient too deconditioned.  Family interested in short-term rehab. - Therapy and TOC notified.   Right nose epistaxis: Mild.  Resolved.   Thrombocytopenia: Mild and stable. - Continue monitoring   Palliative care/goals of care-poor prognosis. -DNI DNR -Palliative following   Severe malnutrition Body mass index is 23.04 kg/m. Nutrition Problem: Severe Malnutrition Etiology: chronic illness (NASH cirrhosis, CHF) Signs/Symptoms: severe fat depletion, severe muscle depletion Interventions: Magic cup (Mighty Shakes)   DVT prophylaxis: Place TED hose Start: 12/31/23 1303 SCDs Start: 12/30/23 0511      Code Status: Limited: Do not attempt resuscitation (DNR) -DNR-LIMITED -Do Not Intubate/DNI  Family Communication:   Status is: Inpatient Plans for SNF with palliative care   PT Follow up Recs: Skilled Nursing-Short Term Rehab (<3 Hours/Day)01/10/2024 1100  Subjective:  Seems to doing better today.  Denies any obvious abdominal distention or nausea/vomiting.  Daughter is present at bedside  Examination:  General exam: Appears calm and comfortable, scleral icterus/jaundice Respiratory system: Clear to auscultation. Respiratory effort normal. Cardiovascular system: S1 & S2 heard, RRR. No JVD, murmurs, rubs, gallops or clicks. No pedal edema. Gastrointestinal system: Abdomen is nondistended, soft and nontender. No organomegaly or masses felt. Normal bowel sounds  heard.  Central nervous system: Alert and oriented. No focal neurological deficits. Extremities: Symmetric 5 x 5 power. Skin: No rashes, lesions or ulcers Psychiatry: Judgement and insight appear normal. Mood & affect appropriate.

## 2024-01-11 NOTE — Progress Notes (Signed)
 Mobility Specialist Progress Note:    01/11/24 1200  Mobility  Activity Ambulated with assistance (In hallway)  Level of Assistance Minimal assist, patient does 75% or more  Assistive Device Front wheel walker  Distance Ambulated (ft) 120 ft  Activity Response Tolerated well  Mobility Referral Yes  Mobility visit 1 Mobility  Mobility Specialist Start Time (ACUTE ONLY) 1057  Mobility Specialist Stop Time (ACUTE ONLY) 1112  Mobility Specialist Time Calculation (min) (ACUTE ONLY) 15 min   Received pt in chair and agreeable to mobility. Pt required MinA STS, otherwise MinG. Pt c/o RLE pain. Returned to room without fault. Pt left in chair. Personal belongings and call light within reach. All needs met.  Lavanda Pollack Mobility Specialist  Please contact via [employer] or  Rehab Office [phone]

## 2024-01-11 NOTE — Progress Notes (Signed)
 Patient ID: Chelsea Boyer, female   DOB: [birth year], 78 y.o.   MRN: [id number]    Progress Note from the Palliative Medicine Team at [facility]   Patient Name: Chelsea Boyer        Date: 01/11/2024 DOB: 1946-03-13  Age: 78 y.o. 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    CT head without acute finding.  CT abdomen and pelvis showed liver cirrhosis with large volume ascites.  CXR without acute finding.  Patient was started on diuretics and also underwent paracentesis with removal of 10 L on 10/10.  Peritoneal fluid negative for SBP.  Cardiology consulted.  TTE with LVEF of 55 to 60%, RVSP of 77, severely elevated PASP, mechanical MVR, severe TR, TAVR valve with moderate to severe stenosis.  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  [name].  CODE STATUS changed to DNR.    Patient and her family face ongoing decisions regarding treatment options, advance care planning and anticipatory care needs.    Subjective  Extensive chart review has been completed prior to meeting with patient/family  including labs, vital signs, imaging, progress/consult notes, orders, medications and available advance directive documents.    This NP assessed patient at the bedside as a follow up for palliative medicine needs and emotional support and to meet with family as  scheduled for ongoing conversation regarding advance care decisions, treatment option decision, and anticipatory care needs.  Patient is alert and oriented, patient is out of bed to the chair.  She looks really good this morning she is smiling and talkative.  Daughter at bedside  Ongoing education regarding current medical situation specific to complex medical situation; NASH/Cirrhosis, heart failure, anasarca/ascites and likely disease trajectory.  We discussed medical management of her current chronic diseases  Patient and her family are hopeful for ongoing improvement.  Patient and family are in conversation with [facility] for anticipated discharge to SNF for short-term debilitation        Patient and her family understand the seriousness of her current medical situation, however at this time all remain open to all offered and available medical interventions to prolong life.  Patient is open to short-term rehab, in hopes of ability to return home   Education offered today regarding  the importance of continued conversation with family and their  medical providers regarding overall plan of care and treatment options,  ensuring decisions are within the context of the patients values and GOCs.  Questions and concerns addressed   Discussed with primary team and nursing staff   Time: 25 minutes  Discussed with attending team and TOC  Detailed review of medical records ( labs, imaging, vital signs), medically appropriate exam ( MS, skin, cardiac,  resp)   discussed with treatment team, counseling and education to patient, family, staff, documenting clinical information, medication management, coordination of care    Ronal Plants NP  Palliative Medicine Team Team Phone # [phone] Pager [phone]

## 2024-01-11 NOTE — Progress Notes (Signed)
 Nutrition Follow-up  DOCUMENTATION CODES:   Severe malnutrition in context of chronic illness  INTERVENTION:  Continue Regular diet as ordered Ensure Plus High Protein po BID, each supplement provides 350 kcal and 20 grams of protein. Magic cup TID with meals, each supplement provides 290 kcal and 9 grams of protein  NUTRITION DIAGNOSIS:   Severe Malnutrition related to chronic illness (NASH cirrhosis, CHF) as evidenced by severe fat depletion, severe muscle depletion. - remains applicable  GOAL:   Patient will meet greater than or equal to 90% of their needs - goal unmet  MONITOR:   PO intake, Supplement acceptance, Weight trends, Labs  REASON FOR ASSESSMENT:   Consult Assessment of nutrition requirement/status  ASSESSMENT:   Pt with hx of NASH liver cirrhosis, chronic heart failure, atrial fibrillation, and TAVR. 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  10/9 - admitted 10/10 - underwent paracentesis, 10L hazy dark fluid from R abdomen removed 10/13 - RHC- severe pulmonary hypertension with elevated wedge 10/14 -  poor prognosis, palliative consulted for GOC (continue current scope of care, DNR) 10/19 - repeat paracentesis yield 4.3L  Pt medically stable for discharge. Awaiting SNF placement.   Checked in with pt at bedside. She is in good spirits. Reports that she is eating a bit better. Appetite waxes and wanes.   Meal completions: 10/19: 100% dinner 10/20: 45% breakfast, 25% lunch, 65% dinner 10/21: 45% breakfast, 25% lunch, 35% dinner 10/22: 10% breakfast  Cannot recall whether she is consuming nutrition supplements or not. Per review of MAR, pt intermittently refusing ensure though has received both scheduled supplements today.   Admit weight: 63.5 kg Current weight: 56.1 kg Weight fluctuations and decreases expected d/t ascites and pulling of fluid with paracentesis.   Medications: lactulose 20g TID, torsemide  40mg  daily,  warfarin  Labs: Sodium 123 BUN 34 Cr 1.12 GFR 50   Diet Order:   Diet Order             Diet regular Room service appropriate? Yes; Fluid consistency: Thin  Diet effective now                   EDUCATION NEEDS:   No education needs have been identified at this time  Skin:  Skin Assessment: Reviewed RN Assessment  Last BM:  10/22 type 6 medium  Height:   Ht Readings from Last 1 Encounters:  01/02/24 5' 1 (1.549 m)    Weight:   Wt Readings from Last 1 Encounters:  01/11/24 56.1 kg    Ideal Body Weight:  47.7 kg  BMI:  Body mass index is 23.37 kg/m.  Estimated Nutritional Needs:   Kcal:  1600-1800  Protein:  70-90g  Fluid:  1.6-1.8L  Royce Maris, RDN, LDN Clinical Nutrition See AMiON for contact information.

## 2024-01-11 NOTE — Progress Notes (Signed)
 PHARMACY - ANTICOAGULATION CONSULT NOTE  Pharmacy Consult for Heparin Indication: mechanical mitral valve   No Known Allergies  Patient Measurements: Height: 5' 1 (154.9 cm) Weight: 56.1 kg (123 lb 10.9 oz) IBW/kg (Calculated) : 47.8 HEPARIN DW (KG): 60.3  Vital Signs: Temp: 97.9 F (36.6 C) (10/22 0837) Temp Source: Oral (10/22 0837) BP: 99/48 (10/22 0837) Pulse Rate: 88 (10/22 0837)  Labs: Recent Labs    01/09/24 0249 01/10/24 0231 01/11/24 0226  HGB 9.3* 9.4* 9.2*  HCT 26.6* 27.4* 26.7*  PLT 128* 149* 156  LABPROT 24.6* 25.0* 29.6*  INR 2.1* 2.1* 2.7*  CREATININE 1.34* 1.29* 1.12*   Estimated Creatinine Clearance: 30.7 mL/min (A) (by C-G formula based on SCr of 1.12 mg/dL (H)).  Assessment: Patient is a 78 YO F presenting with abdominal distension, peripheral edema, and fatigue. 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 According to last anticoag clinic note on 9/25, warfarin regimen was 2.5mg  tablet daily and INR was 3 at that time. Warfarin on hold   Now s/p cath 10/13 and back on warfarin -INR 2.7 and up from 2.1  Goal of Therapy:  INR goal 2.5-3.5 Monitor platelets by anticoagulation protocol: Yes   Plan:  -Warfarin 2.5mg  po today -Daily PT/INR  Thank you for allowing pharmacy to be involved with this patient's care.  Prentice Poisson, PharmD Clinical Pharmacist **Pharmacist phone directory can now be found on [email or web address] ([email or web address]).  Listed under [facility].

## 2024-01-12 DIAGNOSIS — E871 Hypo-osmolality and hyponatremia: Secondary | ICD-10-CM | POA: Diagnosis not present

## 2024-01-12 LAB — COMPREHENSIVE METABOLIC PANEL WITH GFR
ALT: 10 U/L (ref 0–44)
AST: 38 U/L (ref 15–41)
Albumin: 2.2 g/dL — ABNORMAL LOW (ref 3.5–5.0)
Alkaline Phosphatase: 63 U/L (ref 38–126)
Anion gap: 9 (ref 5–15)
BUN: 31 mg/dL — ABNORMAL HIGH (ref 8–23)
CO2: 23 mmol/L (ref 22–32)
Calcium: 7.6 mg/dL — ABNORMAL LOW (ref 8.9–10.3)
Chloride: 91 mmol/L — ABNORMAL LOW (ref 98–111)
Creatinine, Ser: 1.31 mg/dL — ABNORMAL HIGH (ref 0.44–1.00)
GFR, Estimated: 41 mL/min — ABNORMAL LOW (ref >60)
Glucose, Bld: 118 mg/dL — ABNORMAL HIGH (ref 70–99)
Potassium: 4.5 mmol/L (ref 3.5–5.1)
Sodium: 123 mmol/L — ABNORMAL LOW (ref 135–145)
Total Bilirubin: 1.9 mg/dL — ABNORMAL HIGH (ref 0.0–1.2)
Total Protein: 6.4 g/dL — ABNORMAL LOW (ref 6.5–8.1)

## 2024-01-12 LAB — PROTIME-INR
INR: 3 — ABNORMAL HIGH (ref 0.8–1.2)
Prothrombin Time: 32.7 s — ABNORMAL HIGH (ref 11.4–15.2)

## 2024-01-12 MED ORDER — SPIRONOLACTONE 25 MG PO TABS: 25.0000 mg | ORAL_TABLET | Freq: Every day | ORAL | Status: AC

## 2024-01-12 MED ORDER — MIDODRINE HCL 5 MG PO TABS: 5.0000 mg | ORAL_TABLET | Freq: Three times a day (TID) | ORAL | Status: DC

## 2024-01-12 MED ORDER — WARFARIN SODIUM 2.5 MG PO TABS
2.5000 mg | ORAL_TABLET | Freq: Every day | ORAL | Status: DC
  Administered 2024-01-12 – 2024-01-15 (×4): 2.5 mg via ORAL
  Filled 2024-01-12 (×4): qty 1

## 2024-01-12 MED ORDER — TORSEMIDE 40 MG PO TABS: 40.0000 mg | ORAL_TABLET | Freq: Every day | ORAL | Status: DC

## 2024-01-12 MED ORDER — RIFAXIMIN 550 MG PO TABS: 550.0000 mg | ORAL_TABLET | Freq: Two times a day (BID) | ORAL | Status: DC

## 2024-01-12 MED ORDER — LACTULOSE 10 GM/15ML PO SOLN: 20.0000 g | Freq: Three times a day (TID) | ORAL | Status: DC

## 2024-01-12 MED ORDER — METOPROLOL SUCCINATE ER 25 MG PO TB24: 25.0000 mg | ORAL_TABLET | Freq: Every day | ORAL | Status: AC

## 2024-01-12 NOTE — Progress Notes (Signed)
 [facility] Liaison Note:  Notified by [facility] manager of patient/family request for [facility] Palliative services at [facility] after discharge. Please call with any hospice or outpatient palliative care related questions. Thank you for the opportunity to participate in this patient's care.  Eleanor Nail, LPN [facility] Liaison [phone]

## 2024-01-12 NOTE — Progress Notes (Signed)
 Mobility Specialist Progress Note:    01/12/24 1156  Mobility  Activity Ambulated with assistance  Level of Assistance Contact guard assist, steadying assist  Assistive Device Front wheel walker  Distance Ambulated (ft) 100 ft  Activity Response Tolerated well  Mobility Referral Yes  Mobility visit 1 Mobility  Mobility Specialist Start Time (ACUTE ONLY) 1156  Mobility Specialist Stop Time (ACUTE ONLY) 1208  Mobility Specialist Time Calculation (min) (ACUTE ONLY) 12 min   Received pt ambulating back to bed from bathroom with RN but agreeable to session. No c/o any symptoms. Pt seemed to be moving and ambulating much better w/ little to no pain. Returned pt to bed w/ all needs met.   Venetia Keel Mobility Specialist Please Neurosurgeon or Rehab Office at [phone]

## 2024-01-12 NOTE — TOC Progression Note (Addendum)
 Transition of Care [facility]) - Progression Note    Patient Details  Name: Chelsea Boyer MRN: [id number] Date of Birth: 11-05-1945  Transition of Care [facility]) CM/SW Contact  Luise JAYSON Pan, [place] Phone Number: 01/12/2024, 10:47 AM  Clinical Narrative:   CSW called pts dtr, [name], with no response. CSW [employer] and awaiting response regarding SNF choice.   CSW called patients spouse, Lynwood, about family visiting [facility] yesterday and if they would like to move forward with them. Lynwood stated yes they did and his daughter has an obligation this morning but he will attempt to get in touch with her to ask if [facility] is the final choice. CSW informed Lynwood to let CSW know as soon as possible as [facility] bed availability is slim.   10:57 AM Lynwood called CSW back and informed family would like to move forward with [facility] SNFw/ outpatient palliative services. Lynwood stated he wants to speak with MD prior to patient discharging anywhere, and he will be at the hospital sometime after 12:30pm today. CSW informed MD and bedside RN. CSW notified facility of family's choice and inquired about what palliative agency they use.   CSW answered Lynwood questions regarding home equipment after SNF. All questions answered at this time from CSWs end.   11:20 AM Per [place]rene with admissions at [facility], facility uses [facility] for palliative services. CSW initiated referral to [facility].   11:30 AM Per [place], AD with [facility], facility does not have a bed available until next week.   11:49 AM CSW called and left VM for patients spouse, Lynwood, to [employer] of [facility] not having bed availability.   11:59 AM Lynwood called CSW back to discuss VM left. Lynwood stated they only want patient to go to a in [place]. CSW informed Lynwood that CSW will follow up with [facility]. CSW reached out to [facility] SNF about bed availability and to review patient referral as well.   2:01 PM CSW emailed updated list of  [place] facilities to [place] to review.   3:45 PM Family chose [facility] at this time for SNF rehab. Per [place] with [facility], facility had a private room available tomorrow. CSW notified treatment team and [facility].   Family stated they will provide transportation for patient and CSW confirmed with facility that they will assist in taking the patient out of the car. Per family, they can transport patient around 2PM to facility.  CSW answered family's questions about placement and insurance, all questions answered at this time.   CSW will continue to follow.    Expected Discharge Plan: Skilled Nursing Facility Barriers to Discharge: Continued Medical Work up, SNF Pending bed offer               Expected Discharge Plan and Services In-house Referral: Clinical Social Work Discharge Planning Services: CM Consult   Living arrangements for the past 2 months: Skilled Nursing Facility                           HH Arranged: PT [facility] Agency: [facility] Date [facility] Agency Contacted: 12/30/23   Representative spoke with at [facility] Agency: [facility]   Social Drivers of Health (SDOH) Interventions SDOH Screenings   Food Insecurity: No Food Insecurity (12/30/2023)  Housing: Low Risk  (12/30/2023)  Transportation Needs: No Transportation Needs (12/30/2023)  Utilities: Not At Risk (12/30/2023)  Social Connections: Patient Declined (12/30/2023)  Tobacco Use: Low Risk  (08/09/2023)    Readmission Risk  Interventions     No data to display

## 2024-01-12 NOTE — Progress Notes (Signed)
 PHARMACY - ANTICOAGULATION CONSULT NOTE  Pharmacy Consult for Heparin Indication: mechanical mitral valve   No Known Allergies  Patient Measurements: Height: 5' 1 (154.9 cm) Weight: 54.9 kg (121 lb 0.5 oz) IBW/kg (Calculated) : 47.8 HEPARIN DW (KG): 60.3  Vital Signs: Temp: 97.8 F (36.6 C) (10/23 0405) Temp Source: Oral (10/23 0405) BP: 109/52 (10/23 0405) Pulse Rate: 95 (10/23 0405)  Labs: Recent Labs    01/10/24 0231 01/11/24 0226 01/12/24 0220  HGB 9.4* 9.2*  --   HCT 27.4* 26.7*  --   PLT 149* 156  --   LABPROT 25.0* 29.6* 32.7*  INR 2.1* 2.7* 3.0*  CREATININE 1.29* 1.12*  --    Estimated Creatinine Clearance: 30.7 mL/min (A) (by C-G formula based on SCr of 1.12 mg/dL (H)).  Assessment: Patient is a 78 YO F presenting with abdominal distension, peripheral edema, and fatigue. 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 According to last anticoag clinic note on 9/25, warfarin regimen was 2.5mg  tablet daily and INR was 3 at that time. Warfarin on hold   Now s/p cath 10/13 and back on warfarin -INR 3.0 and up from 2.7 Meal consumption has ranged from 35% to 100% over the past 7 days  Goal of Therapy:  INR goal 2.5-3.5 Monitor platelets by anticoagulation protocol: Yes   Plan:  -Warfarin 2.5 mg po daily -Daily PT/INR  Thank you for allowing pharmacy to be involved with this patient's care.  Benedetta Heath BS, PharmD, BCPS Clinical Pharmacist 01/12/2024 7:31 AM  Contact: [phone] after 3 PM

## 2024-01-12 NOTE — Progress Notes (Signed)
 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    CT head without acute finding.  CT abdomen and pelvis showed liver cirrhosis with large volume ascites.  CXR without acute finding.  Patient was started on diuretics and also underwent paracentesis with removal of 10 L on 10/10.  Peritoneal fluid negative for SBP.  Cardiology consulted.  TTE with LVEF of 55 to 60%, RVSP of 77, severely elevated PASP, mechanical MVR, severe TR, TAVR valve with moderate to severe stenosis.  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  [name].  CODE STATUS changed to DNR.    [name].  Underwent repeat para with removal of 4.3 L on 10/19.  Stable for discharge.  Family interested in short-term rehab.  TOC working on this.  Assessment & Plan:  Acute on chronic HFpEF/RV failure/severe PAH:  Echocardiogram showing EF of 60% with RVSP of 77, elevated PASP, mechanical MVR, severe TR, TAVR valve with moderate to severe stenosis.  Underwent RHC on 10/13 showing pulmonary hypertension.  Patient has been seen by cardiology team and diuretics have been adjusted to p.o. - Cardiac medication recommendation-Toprol -XL, midodrine, Aldactone , torsemide  and pharmacy to dose Coumadin .    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  Stable.  Creatinine 1.3    Hypotension: BP improved. - Continue midodrine   Hypervolemic hyponatremia: Low but stable - Diuretics as above.   Chronic A-fib: Rate controlled.  On warfarin for AC.  -Continue Toprol -XL  - Anticoagulation as above.   Hypothyroidism -Continue Synthroid   Normocytic anemia: H&H relatively stable.  No overt bleeding. -Continue monitoring   Debility/deconditioning: Patient too deconditioned.  Family interested in short-term rehab. - Therapy and TOC notified.   Right nose epistaxis: Mild.  Resolved.   Thrombocytopenia: Mild and stable. - Continue monitoring   Palliative care/goals of care-poor prognosis. -DNI DNR -Palliative following   Severe malnutrition Body mass index is 23.04 kg/m. Nutrition Problem: Severe Malnutrition Etiology: chronic illness (NASH cirrhosis, CHF) Signs/Symptoms: severe fat depletion, severe muscle depletion Interventions: Magic cup (Mighty Shakes)   DVT prophylaxis: SCDs    Code Status: Limited: Do not attempt resuscitation (DNR) -DNR-LIMITED -Do Not Intubate/DNI  Family Communication:   Status is: Inpatient Plans for SNF with palliative care   PT Follow up Recs: Skilled Nursing-Short Term Rehab (<3 Hours/Day)01/10/2024 1100  Subjective: Laying in the bed denying any complaints at this time. She understands that her INR is elevated therefore we may not be able to perform paracentesis prior to her discharge.  Currently denying any nausea and vomiting.  Examination:  General exam: Appears calm and comfortable, scleral icterus/jaundice Respiratory system: Clear to auscultation. Respiratory effort normal. Cardiovascular system: S1 & S2 heard, RRR. No  JVD, murmurs, rubs, gallops  or clicks. No pedal edema. Gastrointestinal system: Abdomen is nondistended, soft and nontender. No organomegaly or masses felt. Normal bowel sounds heard. Central nervous system: Alert and oriented. No focal neurological deficits. Extremities: Symmetric 5 x 5 power. Skin: No rashes, lesions or ulcers Psychiatry: Judgement and insight appear normal. Mood & affect appropriate.                Diet Orders (From admission, onward)     Start     Ordered   01/02/24 1725  Diet regular Room service appropriate? Yes; Fluid consistency: Thin  Diet effective now       Question Answer Comment  Room service appropriate? Yes   Fluid consistency: Thin      01/02/24 1724            Objective: Vitals:   01/12/24 0500 01/12/24 0850 01/12/24 1013 01/12/24 1016  BP:  (!) 109/59 111/67 111/67  Pulse:  90 95   Resp:  18    Temp:  97.8 F (36.6 C)    TempSrc:  Oral    SpO2:  100%    Weight: 54.9 kg     Height:        Intake/Output Summary (Last 24 hours) at 01/12/2024 1148 Last data filed at 01/12/2024 0400 Gross per 24 hour  Intake 120 ml  Output 1100 ml  Net -980 ml   Filed Weights   01/10/24 0458 01/11/24 0500 01/12/24 0500  Weight: 55.3 kg 56.1 kg 54.9 kg    Scheduled Meds:  feeding supplement  237 mL Oral BID BM   fluticasone  1 spray Each Nare Daily   lactulose  20 g Oral TID   levothyroxine  125 mcg Oral Q0600   metoprolol  succinate  25 mg Oral Daily   midodrine  5 mg Oral TID WC   rifaximin  550 mg Oral BID   sodium chloride flush  3 mL Intravenous Q12H   spironolactone   25 mg Oral Daily   torsemide   40 mg Oral Daily   warfarin  2.5 mg Oral q1600   Warfarin - Pharmacist Dosing Inpatient   Does not apply q1600   Continuous Infusions:  albumin human      Nutritional status Signs/Symptoms: severe fat depletion, severe muscle depletion Interventions: Magic cup (Mighty Shakes) Body mass index is 22.87 kg/m.  Data Reviewed:   CBC: Recent Labs  Lab  01/07/24 0537 01/08/24 0236 01/09/24 0249 01/10/24 0231 01/11/24 0226  WBC 5.5 6.0 6.4 7.6 6.9  HGB 9.4* 8.9* 9.3* 9.4* 9.2*  HCT 27.1* 25.9* 26.6* 27.4* 26.7*  MCV 77.9* 77.3* 78.0* 78.3* 79.2*  PLT 123* 123* 128* 149* 156   Basic Metabolic Panel: Recent Labs  Lab 01/07/24 0537 01/08/24 0236 01/09/24 0249 01/10/24 0231 01/11/24 0226 01/12/24 0907  NA 127* 126* 123* 124* 123* 123*  K 3.6 3.8 3.8 3.9 4.1 4.5  CL 91* 91* 91* 92* 92* 91*  CO2 25 24 23 24 23 23   GLUCOSE 85 87 88 90 87 118*  BUN 47* 49* 44* 40* 34* 31*  CREATININE 1.51* 1.32* 1.34* 1.29* 1.12* 1.31*  CALCIUM 8.5* 8.2* 7.8* 7.9* 7.8* 7.6*  MG 1.9 1.9 1.7 2.0 1.9  --   PHOS  --  2.8 2.7  --  2.5  --    GFR: Estimated Creatinine Clearance: 26.3 mL/min (A) (by C-G formula based on SCr of 1.31 mg/dL (H)). Liver Function Tests: Recent Labs  Lab 01/07/24 0537 01/08/24  0236 01/09/24 0249 01/10/24 0231 01/11/24 0226 01/12/24 0907  AST 34  --   --  35  --  38  ALT 9  --   --  11  --  10  ALKPHOS 97  --   --  67  --  63  BILITOT 2.2*  --   --  2.3*  --  1.9*  PROT 6.5  --   --  6.0*  --  6.4*  ALBUMIN 2.4* 2.3* 2.1* 2.0* 2.2* 2.2*   No results for input(s): LIPASE, AMYLASE in the last 168 hours. Recent Labs  Lab 01/08/24 0236 01/09/24 0249  AMMONIA 68* 41*   Coagulation Profile: Recent Labs  Lab 01/08/24 0236 01/09/24 0249 01/10/24 0231 01/11/24 0226 01/12/24 0220  INR 3.4* 2.1* 2.1* 2.7* 3.0*   Cardiac Enzymes: No results for input(s): CKTOTAL, CKMB, CKMBINDEX, TROPONINI in the last 168 hours. BNP (last 3 results) No results for input(s): PROBNP in the last 8760 hours. HbA1C: No results for input(s): HGBA1C in the last 72 hours. CBG: No results for input(s): GLUCAP in the last 168 hours. Lipid Profile: No results for input(s): CHOL, HDL, LDLCALC, TRIG, CHOLHDL, LDLDIRECT in the last 72 hours. Thyroid Function Tests: No results for input(s): TSH, T4TOTAL,  FREET4, T3FREE, THYROIDAB in the last 72 hours. Anemia Panel: No results for input(s): VITAMINB12, FOLATE, FERRITIN, TIBC, IRON, RETICCTPCT in the last 72 hours. Sepsis Labs: No results for input(s): PROCALCITON, LATICACIDVEN in the last 168 hours.  No results found for this or any previous visit (from the past 240 hours).       Radiology Studies: No results found.         LOS: 13 days   Time spent= 35 mins    Burgess JAYSON Dare, MD [facility]  If 7PM-7AM, please contact night-coverage  01/12/2024, 11:48 AM

## 2024-01-12 NOTE — Plan of Care (Signed)
   Problem: Education: Goal: Knowledge of General Education information will improve Description Including pain rating scale, medication(s)/side effects and non-pharmacologic comfort measures Outcome: Progressing

## 2024-01-13 ENCOUNTER — Ambulatory Visit: Admitting: Cardiovascular Disease

## 2024-01-13 DIAGNOSIS — E871 Hypo-osmolality and hyponatremia: Secondary | ICD-10-CM | POA: Diagnosis not present

## 2024-01-13 LAB — COMPREHENSIVE METABOLIC PANEL WITH GFR
ALT: 12 U/L (ref 0–44)
AST: 43 U/L — ABNORMAL HIGH (ref 15–41)
Albumin: 2.4 g/dL — ABNORMAL LOW (ref 3.5–5.0)
Alkaline Phosphatase: 79 U/L (ref 38–126)
Anion gap: 11 (ref 5–15)
BUN: 28 mg/dL — ABNORMAL HIGH (ref 8–23)
CO2: 23 mmol/L (ref 22–32)
Calcium: 8 mg/dL — ABNORMAL LOW (ref 8.9–10.3)
Chloride: 90 mmol/L — ABNORMAL LOW (ref 98–111)
Creatinine, Ser: 1.24 mg/dL — ABNORMAL HIGH (ref 0.44–1.00)
GFR, Estimated: 44 mL/min — ABNORMAL LOW (ref >60)
Glucose, Bld: 85 mg/dL (ref 70–99)
Potassium: 4.6 mmol/L (ref 3.5–5.1)
Sodium: 124 mmol/L — ABNORMAL LOW (ref 135–145)
Total Bilirubin: 1.9 mg/dL — ABNORMAL HIGH (ref 0.0–1.2)
Total Protein: 7.1 g/dL (ref 6.5–8.1)

## 2024-01-13 LAB — PROTIME-INR
INR: 2.8 — ABNORMAL HIGH (ref 0.8–1.2)
Prothrombin Time: 30.5 s — ABNORMAL HIGH (ref 11.4–15.2)

## 2024-01-13 NOTE — Progress Notes (Addendum)
 Occupational Therapy Treatment Patient Details Name: Chelsea Boyer MRN: [id number] DOB: 16-Jan-1946 Today's Date: 01/13/2024   History of present illness 78 y.o F adm 12/29/23 for hyponatremia and volume overload related to cirrhosis. 10/13 radial R/LHC with RV failure and aortic stenosis. 10/19 paracentesis. PMH: cirrhosis, CHF, CKD, hypothyroidism, Afib TAVR   OT comments  Pt progressing towards goals.  Progressed to complete LB dressing with CGA to min assist for STS. Pt with delayed processing and poor problem solving. Requiring increased time and cues to problem solve ADLs. X1 Posterior LOB in standing, pt unable to self-correct, required assist to return to seated EOB. Continue to recommend <3 hours of skilled rehab daily to optimize independence levels. Will continue to follow acutely.       If plan is discharge home, recommend the following:  A little help with walking and/or transfers;A little help with bathing/dressing/bathroom;Assistance with cooking/housework   Equipment Recommendations  None recommended by OT       Precautions / Restrictions Precautions Precautions: Fall Recall of Precautions/Restrictions: Intact Precaution/Restrictions Comments: watch BP Restrictions Weight Bearing Restrictions Per Provider Order: No       Mobility Bed Mobility Overal bed mobility: Needs Assistance Bed Mobility: Rolling, Supine to Sit, Sit to Supine Rolling: Supervision Sidelying to sit: Supervision       General bed mobility comments: Increased time but no assist    Transfers Overall transfer level: Needs assistance Equipment used: Rolling walker (2 wheels) Transfers: Sit to/from Stand Sit to Stand: Contact guard assist, Min assist           General transfer comment: x5 reps of STS performed. Cues during each rep for hand placement     Balance Overall balance assessment: Needs assistance Sitting-balance support: No upper extremity supported, Feet supported Sitting  balance-Leahy Scale: Fair     Standing balance support: Bilateral upper extremity supported, During functional activity, Reliant on assistive device for balance Standing balance-Leahy Scale: Poor Standing balance comment: reliant on RW       ADL either performed or assessed with clinical judgement   ADL Overall ADL's : Needs assistance/impaired       Lower Body Dressing: Contact guard assist;Minimal assistance;Sit to/from stand Lower Body Dressing Details (indicate cue type and reason): Simulated in room, assist to stand and steady to pull above waist Toilet Transfer: Contact guard assist;Minimal assistance;Rolling walker (2 wheels) Toilet Transfer Details (indicate cue type and reason): Assist to stand         Functional mobility during ADLs: Contact guard assist;Minimal assistance;Rolling walker (2 wheels) General ADL Comments: x1 LOB in standing when attempting pull pants above waist, pt unable to self correct, and lowered to bed    Extremity/Trunk Assessment Upper Extremity Assessment Upper Extremity Assessment: Generalized weakness   Lower Extremity Assessment Lower Extremity Assessment: Defer to PT evaluation        Vision   Vision Assessment?: No apparent visual deficits         Communication Communication Communication: No apparent difficulties   Cognition Arousal: Alert Behavior During Therapy: Flat affect Cognition: Cognition impaired       Memory impairment (select all impairments): Short-term memory, Working memory Attention impairment (select first level of impairment): Sustained attention Executive functioning impairment (select all impairments): Problem solving, Reasoning OT - Cognition Comments: Poor stm within session for hand placement with RW. Difficulty problem solving during ADLs requiring significantly increased time and verbal cues     Following commands: Intact        Cueing  Cueing Techniques: Verbal cues        General  Comments Soft bp, but consistent with other BPs    Pertinent Vitals/ Pain       Pain Assessment Pain Assessment: 0-10 Pain Score: 4  Pain Location: Rt hip Pain Descriptors / Indicators: Discomfort, Sore Pain Intervention(s): Monitored during session   Frequency  Min 2X/week        Progress Toward Goals  OT Goals(current goals can now be found in the care plan section)  Progress towards OT goals: Progressing toward goals  Acute Rehab OT Goals Patient Stated Goal: To get rehab OT Goal Formulation: With patient/family Time For Goal Achievement: 01/27/24 Potential to Achieve Goals: Good ADL Goals Pt Will Perform Grooming: with modified independence;standing Pt Will Perform Lower Body Dressing: with modified independence;sit to/from stand Pt Will Transfer to Toilet: with modified independence;ambulating;regular height toilet Pt Will Perform Toileting - Clothing Manipulation and hygiene: with modified independence;sit to/from stand  Plan         AM-PAC OT 6 Clicks Daily Activity     Outcome Measure   Help from another person eating meals?: None Help from another person taking care of personal grooming?: A Little Help from another person toileting, which includes using toliet, bedpan, or urinal?: A Little Help from another person bathing (including washing, rinsing, drying)?: A Little Help from another person to put on and taking off regular upper body clothing?: A Little Help from another person to put on and taking off regular lower body clothing?: A Little 6 Click Score: 19    End of Session Equipment Utilized During Treatment: Gait belt;Rolling walker (2 wheels)  OT Visit Diagnosis: Unsteadiness on feet (R26.81);Other abnormalities of gait and mobility (R26.89);Muscle weakness (generalized) (M62.81)   Activity Tolerance Patient tolerated treatment well   Patient Left in chair;with call bell/phone within reach;with chair alarm set   Nurse Communication Mobility  status        Time: 1111-1140 OT Time Calculation (min): 29 min  Charges: OT General Charges $OT Visit: 1 Visit OT Treatments $Self Care/Home Management : 23-37 mins  Adrianne BROCKS, OT  Acute Rehabilitation Services Office [phone] Secure chat preferred   Adrianne GORMAN Savers 01/13/2024, 12:57 PM

## 2024-01-13 NOTE — Progress Notes (Signed)
 Physical Therapy Treatment Patient Details Name: Chelsea Boyer MRN: [id number] DOB: 1945/08/10 Today's Date: 01/13/2024   History of Present Illness 78 y.o F adm 12/29/23 for hyponatremia and volume overload related to cirrhosis. 10/13 radial R/LHC with RV failure and aortic stenosis. 10/19 paracentesis. PMH: cirrhosis, CHF, CKD, hypothyroidism, Afib TAVR    PT Comments  Pt pleasant but with decreased safety awareness. On arrival pt semi rolled onto side in chair with leg rest up and sacrum all the way to edge of chair requiring guarding and assist to sit up and scoot back in chair to prevent fall prior to initiating mobility. Pt with decreased hip pain and able to ambulate increased distance this session. Beth continues to need cues and assist for transfers with return to bed end of session for pt safety. Patient will benefit from continued inpatient follow up therapy, <3 hours/day     If plan is discharge home, recommend the following: A little help with walking and/or transfers;A little help with bathing/dressing/bathroom;Assistance with cooking/housework;Assist for transportation;Help with stairs or ramp for entrance   Can travel by private vehicle     Yes  Equipment Recommendations  Rolling walker (2 wheels);BSC/3in1    Recommendations for Other Services       Precautions / Restrictions Precautions Precautions: Fall Recall of Precautions/Restrictions: Impaired     Mobility  Bed Mobility Overal bed mobility: Needs Assistance Bed Mobility: Sit to Supine       Sit to supine: Min assist   General bed mobility comments: pt attempted to return to bed and able to control trunk but required assist to lift legs to surface    Transfers Overall transfer level: Needs assistance   Transfers: Sit to/from Stand Sit to Stand: Contact guard assist, Min assist           General transfer comment: CGA to rise from recliner with cues for use of armrests, min assist to rise from  toilet with rail and CGA to rise x 5 from EOB with increased time and cues    Ambulation/Gait Ambulation/Gait assistance: Contact guard assist Gait Distance (Feet): 175 Feet Assistive device: Rolling walker (2 wheels) Gait Pattern/deviations: Step-through pattern, Decreased stride length   Gait velocity interpretation: <1.8 ft/sec, indicate of risk for recurrent falls   General Gait Details: cues for posture and pt without significant hip pain able to step through and increase gait distance. pt walked 20' then 175' after seated rest   Stairs             Wheelchair Mobility     Tilt Bed    Modified Rankin (Stroke Patients Only)       Balance Overall balance assessment: Needs assistance Sitting-balance support: No upper extremity supported, Feet supported Sitting balance-Leahy Scale: Fair Sitting balance - Comments: EOB without support   Standing balance support: Bilateral upper extremity supported, During functional activity, Reliant on assistive device for balance Standing balance-Leahy Scale: Poor Standing balance comment: reliant on RW, propping on forearm at sink                            Communication Communication Communication: No apparent difficulties  Cognition Arousal: Alert Behavior During Therapy: Flat affect   PT - Cognitive impairments: Safety/Judgement, Problem solving, Awareness                       PT - Cognition Comments: on arrival pt rolled semi to left side  in recliner with leg rest up and sacrum scooted all the way to end of chair surface, repeated cues for plan and direction Following commands: Impaired Following commands impaired: Follows one step commands with increased time    Cueing Cueing Techniques: Verbal cues  Exercises      General Comments General comments (skin integrity, edema, etc.): Soft bp, but consistent with other BPs      Pertinent Vitals/Pain Pain Assessment Pain Assessment: No/denies pain     Home Living                          Prior Function            PT Goals (current goals can now be found in the care plan section) Progress towards PT goals: Progressing toward goals    Frequency    Min 2X/week      PT Plan      Co-evaluation              AM-PAC PT 6 Clicks Mobility   Outcome Measure  Help needed turning from your back to your side while in a flat bed without using bedrails?: A Little Help needed moving from lying on your back to sitting on the side of a flat bed without using bedrails?: A Little Help needed moving to and from a bed to a chair (including a wheelchair)?: A Little Help needed standing up from a chair using your arms (e.g., wheelchair or bedside chair)?: A Little Help needed to walk in hospital room?: A Little Help needed climbing 3-5 steps with a railing? : Total 6 Click Score: 16    End of Session   Activity Tolerance: Patient tolerated treatment well Patient left: with call bell/phone within reach;in bed;with bed alarm set Nurse Communication: Mobility status PT Visit Diagnosis: Unsteadiness on feet (R26.81);Other abnormalities of gait and mobility (R26.89);Muscle weakness (generalized) (M62.81);Difficulty in walking, not elsewhere classified (R26.2)     Time: 8782-8762 PT Time Calculation (min) (ACUTE ONLY): 20 min  Charges:    $Gait Training: 8-22 mins PT General Charges $$ ACUTE PT VISIT: 1 Visit                     Lenoard SQUIBB, PT Acute Rehabilitation Services Office: [phone]    Lenoard NOVAK Carolynne Schuchard 01/13/2024, 1:24 PM

## 2024-01-13 NOTE — Progress Notes (Signed)
 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    CT head without acute finding.  CT abdomen and pelvis showed liver cirrhosis with large volume ascites.  CXR without acute finding.  Patient was started on diuretics and also underwent paracentesis with removal of 10 L on 10/10.  Peritoneal fluid negative for SBP.  Cardiology consulted.  TTE with LVEF of 55 to 60%, RVSP of 77, severely elevated PASP, mechanical MVR, severe TR, TAVR valve with moderate to severe stenosis.  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  [name].  CODE STATUS changed to DNR.    [name].  Underwent repeat para with removal of 4.3 L on 10/19.  Stable for discharge.  Family interested in short-term rehab.  TOC working on this.  Assessment & Plan:  Acute on chronic HFpEF/RV failure/severe PAH:  Echocardiogram showing EF of 60% with RVSP of 77, elevated PASP, mechanical MVR, severe TR, TAVR valve with moderate to severe stenosis.  Underwent RHC on 10/13 showing pulmonary hypertension.  Patient has been seen by cardiology team and diuretics have been adjusted to p.o. - Cardiac medication recommendation-Toprol -XL, midodrine, Aldactone , torsemide  and pharmacy to dose Coumadin .    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   Supratherapeutic  INR: I -Pharmacy to dose Coumadin .  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  Would defer further paracentesis for now as she is not symptomatic and has elevated INR from her Coumadin . -On rifaximin, lactulose   CKD-3B: Some element of hepatorenal/cardiorenal syndrome.  Stable.  Creatinine 1.3    Hypotension: BP improved. - Continue midodrine   Chronic hyponatremia - Likely in the setting of cirrhosis.  Sodium stable around 124.  Continue to monitor   Chronic A-fib: Rate controlled.  On warfarin for AC.  -Continue Toprol -XL  - Anticoagulation as above.   Hypothyroidism -Continue Synthroid   Normocytic anemia: H&H relatively stable.  No overt bleeding. -Continue monitoring   Debility/deconditioning: Patient too deconditioned.  Family interested in short-term rehab. - Therapy and TOC notified.   Right nose epistaxis: Mild.  Resolved.   Thrombocytopenia: Mild and stable. - Continue monitoring   Palliative care/goals of care-poor prognosis. -DNI DNR -Palliative following   Severe malnutrition Body mass index is 23.04 kg/m. Nutrition Problem: Severe Malnutrition Etiology: chronic illness (NASH cirrhosis, CHF) Signs/Symptoms: severe fat depletion, severe muscle depletion Interventions: Magic cup (Mighty Shakes)   DVT prophylaxis: SCDs    Code Status: Limited: Do not attempt resuscitation (DNR) -DNR-LIMITED -Do Not Intubate/DNI  Family Communication:  Called jennifer; went to vm Status is: Inpatient Awaiting SNF placement   PT Follow up Recs: Skilled Nursing-Short Term Rehab (<3 Hours/Day)01/10/2024 1100  Subjective: Seen at bedside no complaints today at all.  Examination:  General exam: Appears calm and comfortable, scleral icterus/jaundice Respiratory system: Clear to auscultation. Respiratory effort normal. Cardiovascular system: S1 &  S2 heard, RRR. No  JVD, murmurs, rubs, gallops or clicks. No pedal edema. Gastrointestinal system: Abdomen is nondistended, soft and nontender. No organomegaly or masses felt. Normal bowel sounds heard. Central nervous system: Alert and oriented. No focal neurological deficits. Extremities: Symmetric 5 x 5 power. Skin: No rashes, lesions or ulcers Psychiatry: Judgement and insight appear normal. Mood & affect appropriate.                Diet Orders (From admission, onward)     Start     Ordered   01/02/24 1725  Diet regular Room service appropriate? Yes; Fluid consistency: Thin  Diet effective now       Question Answer Comment  Room service appropriate? Yes   Fluid consistency: Thin      01/02/24 1724            Objective: Vitals:   01/13/24 0359 01/13/24 0400 01/13/24 0715 01/13/24 0943  BP: 90/66  96/63 (!) 96/52  Pulse: 79  79   Resp: 15  20   Temp: 97.6 F (36.4 C)  98.9 F (37.2 C)   TempSrc: Oral  Oral   SpO2: 98%  98%   Weight:  53.8 kg    Height:        Intake/Output Summary (Last 24 hours) at 01/13/2024 1034 Last data filed at 01/12/2024 1904 Gross per 24 hour  Intake 480 ml  Output 200 ml  Net 280 ml   Filed Weights   01/11/24 0500 01/12/24 0500 01/13/24 0400  Weight: 56.1 kg 54.9 kg 53.8 kg    Scheduled Meds:  feeding supplement  237 mL Oral BID BM   fluticasone  1 spray Each Nare Daily   lactulose  20 g Oral TID   levothyroxine  125 mcg Oral Q0600   metoprolol  succinate  25 mg Oral Daily   midodrine  5 mg Oral TID WC   rifaximin  550 mg Oral BID   sodium chloride flush  3 mL Intravenous Q12H   spironolactone   25 mg Oral Daily   torsemide   40 mg Oral Daily   warfarin  2.5 mg Oral q1600   Warfarin - Pharmacist Dosing Inpatient   Does not apply q1600   Continuous Infusions:  albumin human      Nutritional status Signs/Symptoms: severe fat depletion, severe muscle depletion Interventions: Magic cup (Mighty Shakes) Body mass index is 22.41  kg/m.  Data Reviewed:   CBC: Recent Labs  Lab 01/07/24 0537 01/08/24 0236 01/09/24 0249 01/10/24 0231 01/11/24 0226  WBC 5.5 6.0 6.4 7.6 6.9  HGB 9.4* 8.9* 9.3* 9.4* 9.2*  HCT 27.1* 25.9* 26.6* 27.4* 26.7*  MCV 77.9* 77.3* 78.0* 78.3* 79.2*  PLT 123* 123* 128* 149* 156   Basic Metabolic Panel: Recent Labs  Lab 01/07/24 0537 01/08/24 0236 01/09/24 0249 01/10/24 0231 01/11/24 0226 01/12/24 0907 01/13/24 0246  NA 127* 126* 123* 124* 123* 123* 124*  K 3.6 3.8 3.8 3.9 4.1 4.5 4.6  CL 91* 91* 91* 92* 92* 91* 90*  CO2 25 24 23 24 23 23 23   GLUCOSE 85 87 88 90 87 118* 85  BUN 47* 49* 44* 40* 34* 31* 28*  CREATININE 1.51* 1.32* 1.34* 1.29* 1.12* 1.31* 1.24*  CALCIUM 8.5* 8.2* 7.8* 7.9* 7.8* 7.6* 8.0*  MG 1.9 1.9 1.7 2.0 1.9  --   --   PHOS  --  2.8 2.7  --  2.5  --   --    GFR: Estimated Creatinine Clearance:  27.8 mL/min (A) (by C-G formula based on SCr of 1.24 mg/dL (H)). Liver Function Tests: Recent Labs  Lab 01/07/24 0537 01/08/24 0236 01/09/24 0249 01/10/24 0231 01/11/24 0226 01/12/24 0907 01/13/24 0246  AST 34  --   --  35  --  38 43*  ALT 9  --   --  11  --  10 12  ALKPHOS 97  --   --  67  --  63 79  BILITOT 2.2*  --   --  2.3*  --  1.9* 1.9*  PROT 6.5  --   --  6.0*  --  6.4* 7.1  ALBUMIN 2.4*   < > 2.1* 2.0* 2.2* 2.2* 2.4*   < > = values in this interval not displayed.   No results for input(s): LIPASE, AMYLASE in the last 168 hours. Recent Labs  Lab 01/08/24 0236 01/09/24 0249  AMMONIA 68* 41*   Coagulation Profile: Recent Labs  Lab 01/09/24 0249 01/10/24 0231 01/11/24 0226 01/12/24 0220 01/13/24 0246  INR 2.1* 2.1* 2.7* 3.0* 2.8*   Cardiac Enzymes: No results for input(s): CKTOTAL, CKMB, CKMBINDEX, TROPONINI in the last 168 hours. BNP (last 3 results) No results for input(s): PROBNP in the last 8760 hours. HbA1C: No results for input(s): HGBA1C in the last 72 hours. CBG: No results for input(s): GLUCAP in the last  168 hours. Lipid Profile: No results for input(s): CHOL, HDL, LDLCALC, TRIG, CHOLHDL, LDLDIRECT in the last 72 hours. Thyroid Function Tests: No results for input(s): TSH, T4TOTAL, FREET4, T3FREE, THYROIDAB in the last 72 hours. Anemia Panel: No results for input(s): VITAMINB12, FOLATE, FERRITIN, TIBC, IRON, RETICCTPCT in the last 72 hours. Sepsis Labs: No results for input(s): PROCALCITON, LATICACIDVEN in the last 168 hours.  No results found for this or any previous visit (from the past 240 hours).       Radiology Studies: No results found.         LOS: 14 days   Time spent= 35 mins    Burgess JAYSON Dare, MD [facility]  If 7PM-7AM, please contact night-coverage  01/13/2024, 10:34 AM
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
## 2024-01-13 NOTE — Progress Notes (Signed)
 PHARMACY - ANTICOAGULATION CONSULT NOTE  Pharmacy Consult for Heparin Indication: mechanical mitral valve   No Known Allergies  Patient Measurements: Height: 5' 1 (154.9 cm) Weight: 53.8 kg (118 lb 9.7 oz) IBW/kg (Calculated) : 47.8 HEPARIN DW (KG): 60.3  Vital Signs: Temp: 97.6 F (36.4 C) (10/24 0359) Temp Source: Oral (10/24 0359) BP: 90/66 (10/24 0359) Pulse Rate: 79 (10/24 0359)  Labs: Recent Labs    01/11/24 0226 01/12/24 0220 01/12/24 0907 01/13/24 0246  HGB 9.2*  --   --   --   HCT 26.7*  --   --   --   PLT 156  --   --   --   LABPROT 29.6* 32.7*  --  30.5*  INR 2.7* 3.0*  --  2.8*  CREATININE 1.12*  --  1.31* 1.24*   Estimated Creatinine Clearance: 27.8 mL/min (A) (by C-G formula based on SCr of 1.24 mg/dL (H)).  Assessment: Patient is a 78 YO F presenting with abdominal distension, peripheral edema, and fatigue. 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 According to last anticoag clinic note on 9/25, warfarin regimen was 2.5 mg tablet daily and INR was 3 at that time. Warfarin on hold   Now s/p cath 10/13 and back on warfarin -INR 3.0 and up from 2.7 Meal consumption has ranged from 15% to 100% over the past 7 days averaging ~60%  Goal of Therapy:  INR goal 2.5-3.5 Monitor platelets by anticoagulation protocol: Yes   Plan:  -Warfarin 2.5 mg po daily -Daily PT/INR  Thank you for allowing pharmacy to be involved with this patient's care.  Benedetta Heath BS, PharmD, BCPS Clinical Pharmacist 01/13/2024 7:05 AM  Contact: [phone] after 3 PM

## 2024-01-13 NOTE — Progress Notes (Signed)
 [facility] Liaison Note   [facility] continues to follow for discharge disposition for outpatient palliative services.   Please call with any hospice or palliative related questions or concerns.   Eleanor Nail, LPN [facility] Liaison [phone]

## 2024-01-14 ENCOUNTER — Encounter (HOSPITAL_COMMUNITY): Payer: Self-pay | Admitting: Internal Medicine

## 2024-01-14 DIAGNOSIS — E871 Hypo-osmolality and hyponatremia: Secondary | ICD-10-CM | POA: Diagnosis not present

## 2024-01-14 LAB — COMPREHENSIVE METABOLIC PANEL WITH GFR
ALT: 11 U/L (ref 0–44)
AST: 39 U/L (ref 15–41)
Albumin: 2.3 g/dL — ABNORMAL LOW (ref 3.5–5.0)
Alkaline Phosphatase: 70 U/L (ref 38–126)
Anion gap: 10 (ref 5–15)
BUN: 26 mg/dL — ABNORMAL HIGH (ref 8–23)
CO2: 23 mmol/L (ref 22–32)
Calcium: 7.8 mg/dL — ABNORMAL LOW (ref 8.9–10.3)
Chloride: 89 mmol/L — ABNORMAL LOW (ref 98–111)
Creatinine, Ser: 1.22 mg/dL — ABNORMAL HIGH (ref 0.44–1.00)
GFR, Estimated: 45 mL/min — ABNORMAL LOW (ref >60)
Glucose, Bld: 84 mg/dL (ref 70–99)
Potassium: 4.1 mmol/L (ref 3.5–5.1)
Sodium: 122 mmol/L — ABNORMAL LOW (ref 135–145)
Total Bilirubin: 1.7 mg/dL — ABNORMAL HIGH (ref 0.0–1.2)
Total Protein: 6.3 g/dL — ABNORMAL LOW (ref 6.5–8.1)

## 2024-01-14 LAB — BASIC METABOLIC PANEL WITH GFR
Anion gap: 10 (ref 5–15)
BUN: 26 mg/dL — ABNORMAL HIGH (ref 8–23)
CO2: 20 mmol/L — ABNORMAL LOW (ref 22–32)
Calcium: 8.1 mg/dL — ABNORMAL LOW (ref 8.9–10.3)
Chloride: 93 mmol/L — ABNORMAL LOW (ref 98–111)
Creatinine, Ser: 1.1 mg/dL — ABNORMAL HIGH (ref 0.44–1.00)
GFR, Estimated: 51 mL/min — ABNORMAL LOW (ref >60)
Glucose, Bld: 95 mg/dL (ref 70–99)
Potassium: 4.4 mmol/L (ref 3.5–5.1)
Sodium: 123 mmol/L — ABNORMAL LOW (ref 135–145)

## 2024-01-14 LAB — PROTIME-INR
INR: 2.9 — ABNORMAL HIGH (ref 0.8–1.2)
Prothrombin Time: 31.7 s — ABNORMAL HIGH (ref 11.4–15.2)

## 2024-01-14 LAB — URIC ACID: Uric Acid, Serum: 9 mg/dL — ABNORMAL HIGH (ref 2.5–7.1)

## 2024-01-14 MED ORDER — KETOROLAC TROMETHAMINE 15 MG/ML IJ SOLN
15.0000 mg | Freq: Once | INTRAMUSCULAR | Status: AC
  Administered 2024-01-14: 15 mg via INTRAVENOUS
  Filled 2024-01-14: qty 1

## 2024-01-14 NOTE — TOC Progression Note (Signed)
 Transition of Care [facility]) - Progression Note    Patient Details  Name: Chelsea Boyer MRN: [id number] Date of Birth: [birth year]  Transition of Care [facility]) CM/SW Contact  Isaiah Public, LCSWA Phone Number: 01/14/2024, 10:05 AM  Clinical Narrative:     [facility] can accept patient on Monday if medically ready. CSW will continue to follow.  Expected Discharge Plan: Skilled Nursing Facility Barriers to Discharge: Continued Medical Work up, SNF Pending bed offer               Expected Discharge Plan and Services In-house Referral: Clinical Social Work Discharge Planning Services: CM Consult   Living arrangements for the past 2 months: Skilled Nursing Facility                           HH Arranged: PT [facility] Agency: [facility] Date [facility] Agency Contacted: 12/30/23   Representative spoke with at [place] Agency: Darleene   Social Drivers of Health (SDOH) Interventions SDOH Screenings   Food Insecurity: No Food Insecurity (12/30/2023)  Housing: Low Risk  (12/30/2023)  Transportation Needs: No Transportation Needs (12/30/2023)  Utilities: Not At Risk (12/30/2023)  Social Connections: Patient Declined (12/30/2023)  Tobacco Use: Low Risk  (08/09/2023)    Readmission Risk Interventions     No data to display

## 2024-01-14 NOTE — Progress Notes (Signed)
 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 CT abdomen and pelvis showed liver cirrhosis with large volume ascites.  CXR without acute finding.  Patient was started on diuretics and also underwent paracentesis with removal of 10 L on 10/10.  Peritoneal fluid negative for SBP.  Cardiology consulted.  TTE with LVEF of 55 to 60%, RVSP of 77, severely elevated PASP, mechanical MVR, severe TR, TAVR valve with moderate to severe stenosis.  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  [name].  CODE STATUS changed to DNR.   [name].  Underwent repeat para with removal of 4.3 L on 10/19.  Stable for discharge.  Family interested in short-term rehab.  TOC working on this.  Assessment and plan   Acute on chronic HFpEF/RV failure/severe PAH:  Echocardiogram showing EF of 60% with RVSP of 77, elevated PASP, mechanical MVR, severe TR, TAVR valve with moderate to severe stenosis.  Underwent RHC on 10/13 showing pulmonary hypertension.  Patient has been seen by cardiology team and diuretics have been adjusted to p.o. - Cardiac medication recommendation-Toprol -XL, midodrine, Aldactone , torsemide  and pharmacy to dose Coumadin . 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   -Diuretics as above -On rifaximin, lactulose   CKD-3B: Some element of hepatorenal/cardiorenal syndrome.  Stable.  Creatinine 1.3    Hypotension: BP improved. - Continue midodrine   Chronic hyponatremia - Likely in the setting of cirrhosis.  Sodium stable around 124.  Continue to monitor, asymptomatic, repeat sodium this afternoon   Chronic A-fib: Rate controlled.  On warfarin for AC.  -Continue Toprol -XL  - Anticoagulation as above.   Hypothyroidism -Continue Synthroid   Normocytic anemia: H&H relatively stable.  No overt bleeding. -Continue monitoring   Debility/deconditioning: Patient too deconditioned.  Family interested in short-term rehab. - Therapy and TOC notified.   Right nose epistaxis: Mild.  Resolved.   Thrombocytopenia: Mild and stable. - Continue monitoring   Palliative care/goals of care-poor prognosis. -DNI DNR -Palliative following   Severe malnutrition Body mass index is 23.04 kg/m. Nutrition Problem: Severe Malnutrition Etiology: chronic illness (NASH cirrhosis, CHF) Signs/Symptoms: severe fat depletion, severe muscle depletion Interventions: Magic cup (Mighty Shakes)     DVT prophylaxis: SCDs    Code Status: Limited: Do not attempt resuscitation (DNR) -DNR-LIMITED -Do Not Intubate/DNI  Family Communication:  Called jennifer; went to vm Status is: Inpatient Awaiting SNF placement              Diet Orders (From admission, onward)     Start     Ordered   01/02/24 1725  Diet regular Room service appropriate? Yes; Fluid consistency: Thin  Diet effective now       Question Answer Comment  Room service appropriate? Yes  Fluid consistency: Thin      01/02/24 1724            Objective: Vitals:   01/13/24 2248 01/14/24 0410 01/14/24 0412 01/14/24 0747  BP: 111/66  (!) 106/54  (!) 109/51  Pulse: 95 89  73  Resp: 18 20  20   Temp: (!) 97.5 F (36.4 C) 98.1 F (36.7 C)  98.3 F (36.8 C)  TempSrc: Oral Oral  Oral  SpO2: 100% 98%  97%  Weight:   55.5 kg   Height:        Intake/Output Summary (Last 24 hours) at 01/14/2024 1147 Last data filed at 01/14/2024 0500 Gross per 24 hour  Intake 3 ml  Output 300 ml  Net -297 ml   Filed Weights   01/12/24 0500 01/13/24 0400 01/14/24 0412  Weight: 54.9 kg 53.8 kg 55.5 kg    Scheduled Meds:  feeding supplement  237 mL Oral BID BM   fluticasone  1 spray Each Nare Daily   lactulose  20 g Oral TID   levothyroxine  125 mcg Oral Q0600   metoprolol  succinate  25 mg Oral Daily   midodrine  5 mg Oral TID WC   rifaximin  550 mg Oral BID   sodium chloride flush  3 mL Intravenous Q12H   spironolactone   25 mg Oral Daily   torsemide   40 mg Oral Daily   warfarin  2.5 mg Oral q1600   Warfarin - Pharmacist Dosing Inpatient   Does not apply q1600   Continuous Infusions:  albumin human      Nutritional status Signs/Symptoms: severe fat depletion, severe muscle depletion Interventions: Magic cup (Mighty Shakes) Body mass index is 23.12 kg/m.  Data Reviewed:   CBC: Recent Labs  Lab 01/08/24 0236 01/09/24 0249 01/10/24 0231 01/11/24 0226  WBC 6.0 6.4 7.6 6.9  HGB 8.9* 9.3* 9.4* 9.2*  HCT 25.9* 26.6* 27.4* 26.7*  MCV 77.3* 78.0* 78.3* 79.2*  PLT 123* 128* 149* 156   Basic Metabolic Panel: Recent Labs  Lab 01/08/24 0236 01/09/24 0249 01/10/24 0231 01/11/24 0226 01/12/24 0907 01/13/24 0246 01/14/24 0301  NA 126* 123* 124* 123* 123* 124* 122*  K 3.8 3.8 3.9 4.1 4.5 4.6 4.1  CL 91* 91* 92* 92* 91* 90* 89*  CO2 24 23 24 23 23 23 23   GLUCOSE 87 88 90 87 118* 85 84  BUN 49* 44* 40* 34* 31* 28* 26*  CREATININE 1.32* 1.34* 1.29* 1.12* 1.31* 1.24* 1.22*  CALCIUM 8.2* 7.8* 7.9* 7.8* 7.6* 8.0* 7.8*  MG 1.9 1.7 2.0 1.9  --   --   --   PHOS 2.8 2.7  --  2.5  --   --   --    GFR: Estimated  Creatinine Clearance: 28.2 mL/min (A) (by C-G formula based on SCr of 1.22 mg/dL (H)). Liver Function Tests: Recent Labs  Lab 01/10/24 0231 01/11/24 0226 01/12/24 0907 01/13/24 0246 01/14/24 0301  AST 35  --  38 43* 39  ALT 11  --  10 12 11   ALKPHOS 67  --  63 79 70  BILITOT 2.3*  --  1.9* 1.9* 1.7*  PROT 6.0*  --  6.4* 7.1 6.3*  ALBUMIN 2.0* 2.2* 2.2* 2.4* 2.3*   No results for input(s): LIPASE, AMYLASE in the last 168 hours. Recent Labs  Lab 01/08/24 0236 01/09/24 0249  AMMONIA 68* 41*   Coagulation Profile: Recent Labs  Lab 01/10/24 0231 01/11/24 0226 01/12/24 0220 01/13/24 0246 01/14/24 0301  INR 2.1* 2.7* 3.0* 2.8* 2.9*   Cardiac Enzymes: No results for input(s): CKTOTAL, CKMB, CKMBINDEX, TROPONINI in the last 168 hours. BNP (last 3 results) No results for input(s): PROBNP in the last 8760 hours. HbA1C: No results for input(s): HGBA1C in the last 72 hours. CBG: No results for input(s): GLUCAP in the last 168 hours. Lipid Profile: No results for input(s): CHOL, HDL, LDLCALC, TRIG, CHOLHDL, LDLDIRECT in the last 72 hours. Thyroid Function Tests: No results for input(s): TSH, T4TOTAL, FREET4, T3FREE, THYROIDAB in the last 72 hours. Anemia Panel: No results for input(s): VITAMINB12, FOLATE, FERRITIN, TIBC, IRON, RETICCTPCT in the last 72 hours. Sepsis Labs: No results for input(s): PROCALCITON, LATICACIDVEN in the last 168 hours.  No results found for this or any previous visit (from the past 240 hours).       Radiology Studies: No results found.         LOS: 15 days   Time spent= 35 mins    Sigurd Pac, MD [facility]  If 7PM-7AM, please contact night-coverage  01/14/2024, 11:47 AM

## 2024-01-14 NOTE — Plan of Care (Signed)

## 2024-01-14 NOTE — Progress Notes (Signed)
 PHARMACY - ANTICOAGULATION CONSULT NOTE  Pharmacy Consult for Heparin Indication: mechanical mitral valve   No Known Allergies  Patient Measurements: Height: 5' 1 (154.9 cm) Weight: 55.5 kg (122 lb 5.7 oz) IBW/kg (Calculated) : 47.8 HEPARIN DW (KG): 60.3  Vital Signs: Temp: 98.1 F (36.7 C) (10/25 0410) Temp Source: Oral (10/25 0410) BP: 106/54 (10/25 0410) Pulse Rate: 89 (10/25 0410)  Labs: Recent Labs    01/12/24 0220 01/12/24 0907 01/13/24 0246 01/14/24 0301  LABPROT 32.7*  --  30.5* 31.7*  INR 3.0*  --  2.8* 2.9*  CREATININE  --  1.31* 1.24* 1.22*   Estimated Creatinine Clearance: 28.2 mL/min (A) (by C-G formula based on SCr of 1.22 mg/dL (H)).  Assessment: Patient is a 79 YO F presenting with abdominal distension, peripheral edema, and fatigue. 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 According to last anticoag clinic note on 9/25, warfarin regimen was 2.5 mg tablet daily and INR was 3 at that time. Warfarin on hold   Now s/p cath 10/13 and back on warfarin -INR today 2.9   Goal of Therapy:  INR goal 2.5-3.5 Monitor platelets by anticoagulation protocol: Yes   Plan:  -Warfarin 2.5 mg po daily -Daily PT/INR  Thank you for allowing pharmacy to be involved with this patient's care.  R. Samual Satterfield, PharmD PGY-1 Acute Care Pharmacy Resident [facility] Please refer to [facility] for [facility] Pharm[facility] numbers 01/14/2024 7:50 AM

## 2024-01-15 DIAGNOSIS — E871 Hypo-osmolality and hyponatremia: Secondary | ICD-10-CM | POA: Diagnosis not present

## 2024-01-15 LAB — COMPREHENSIVE METABOLIC PANEL WITH GFR
ALT: 12 U/L (ref 0–44)
AST: 36 U/L (ref 15–41)
Albumin: 2.2 g/dL — ABNORMAL LOW (ref 3.5–5.0)
Alkaline Phosphatase: 74 U/L (ref 38–126)
Anion gap: 10 (ref 5–15)
BUN: 26 mg/dL — ABNORMAL HIGH (ref 8–23)
CO2: 23 mmol/L (ref 22–32)
Calcium: 7.9 mg/dL — ABNORMAL LOW (ref 8.9–10.3)
Chloride: 91 mmol/L — ABNORMAL LOW (ref 98–111)
Creatinine, Ser: 1.19 mg/dL — ABNORMAL HIGH (ref 0.44–1.00)
GFR, Estimated: 47 mL/min — ABNORMAL LOW (ref >60)
Glucose, Bld: 71 mg/dL (ref 70–99)
Potassium: 4.1 mmol/L (ref 3.5–5.1)
Sodium: 124 mmol/L — ABNORMAL LOW (ref 135–145)
Total Bilirubin: 1.5 mg/dL — ABNORMAL HIGH (ref 0.0–1.2)
Total Protein: 6.2 g/dL — ABNORMAL LOW (ref 6.5–8.1)

## 2024-01-15 LAB — SEDIMENTATION RATE: Sed Rate: 65 mm/h — ABNORMAL HIGH (ref 0–22)

## 2024-01-15 LAB — PROTIME-INR
INR: 2.7 — ABNORMAL HIGH (ref 0.8–1.2)
Prothrombin Time: 30.3 s — ABNORMAL HIGH (ref 11.4–15.2)

## 2024-01-15 MED ORDER — PREDNISONE 20 MG PO TABS
40.0000 mg | ORAL_TABLET | Freq: Every day | ORAL | Status: DC
  Administered 2024-01-15 – 2024-01-16 (×2): 40 mg via ORAL
  Filled 2024-01-15 (×2): qty 2

## 2024-01-15 NOTE — Progress Notes (Signed)
 Mobility Specialist Progress Note:   01/15/24 1140  Mobility  Activity Ambulated with assistance  Level of Assistance Contact guard assist, steadying assist  Assistive Device Front wheel walker  Distance Ambulated (ft) 300 ft  Activity Response Tolerated well  Mobility Referral Yes  Mobility visit 1 Mobility  Mobility Specialist Start Time (ACUTE ONLY) 1140  Mobility Specialist Stop Time (ACUTE ONLY) 1152  Mobility Specialist Time Calculation (min) (ACUTE ONLY) 12 min   Pt agreeable to mobility session. Required only minG assist for safety intermittently. No unsteadiness noted. Pt with no c/o throughout Back sitting EOB with all needs met.   Therisa Rana Mobility Specialist Please contact via SecureChat or  Rehab office at [phone]

## 2024-01-15 NOTE — Progress Notes (Signed)
 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 CT abdomen and pelvis showed liver cirrhosis with large volume ascites.  CXR without acute finding.  Patient was started on diuretics and also underwent paracentesis with removal of 10 L on 10/10.  Peritoneal fluid negative for SBP.  Cardiology consulted.  TTE with LVEF of 55 to 60%, RVSP of 77, severely elevated PASP, mechanical MVR, severe TR, TAVR valve with moderate to severe stenosis.  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  [name].  CODE STATUS changed to DNR.   [name].  Underwent repeat para with removal of 4.3 L on 10/19.  Stable for discharge.  Family interested in short-term rehab.  TOC working on this.  Subjective: Feels well overall, still has mild discomfort in fingers  Assessment and plan   Acute on chronic HFpEF/RV failure/severe PAH:  Echocardiogram showing EF of 60% with RVSP of 77, elevated PASP, mechanical MVR, severe TR, TAVR valve with moderate to severe stenosis.  Underwent RHC on 10/13 showing pulmonary hypertension.  Patient has been seen by cardiology team and diuretics have been adjusted to p.o. - Cardiac medication recommendation-Toprol -XL, midodrine, Aldactone , torsemide  and pharmacy to dose Coumadin . 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 Severe tricuspid valve regurgitation Mechanical  mitral valve-on Coumadin .   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   -Diuretics as above -On rifaximin, lactulose  Gout flare, uric acid is 9, ESR 60,  -trial of prednisone   CKD-3B: Some element of hepatorenal/cardiorenal syndrome.  Stable.  Creatinine 1.3    Hypotension: BP improved. - Continue midodrine   Chronic hyponatremia - Likely in the setting of cirrhosis.  Sodium stable around 124.  Continue to monitor, asymptomatic, repeat sodium this afternoon   Chronic A-fib: Rate controlled.  On warfarin for AC.  -Continue Toprol -XL  - Anticoagulation as above.   Hypothyroidism -Continue Synthroid   Normocytic anemia: H&H relatively stable.  No overt bleeding. -Continue monitoring   Debility/deconditioning: Patient too deconditioned.  Family interested in short-term rehab. - Therapy and TOC notified.   Right nose epistaxis: Mild.  Resolved.   Thrombocytopenia: Mild and stable. - Continue monitoring   Palliative care/goals of care-poor prognosis. -DNI DNR -Palliative following   Severe malnutrition Body mass index is 23.04 kg/m. Nutrition Problem: Severe Malnutrition Etiology: chronic illness (NASH cirrhosis, CHF) Signs/Symptoms: severe fat depletion, severe muscle depletion Interventions: Magic cup (Mighty Shakes)     DVT prophylaxis: SCDs    Code Status: Limited: Do not attempt resuscitation (DNR) -DNR-LIMITED -Do Not Intubate/DNI  Family Communication: No family at bedside Status is: Inpatient Awaiting SNF placement              Diet Orders (From admission, onward)     Start  Ordered   01/02/24 1725  Diet regular Room service appropriate? Yes; Fluid consistency: Thin  Diet effective now       Question  Answer Comment  Room service appropriate? Yes   Fluid consistency: Thin      01/02/24 1724            Objective: Vitals:   01/14/24 2336 01/15/24 0500 01/15/24 0513 01/15/24 0712  BP: (!) 95/46  (!) 102/46 (!) 105/54  Pulse: 62  (!) 58 92  Resp: 16  16 18   Temp: 97.9 F (36.6 C)  97.8 F (36.6 C) 98 F (36.7 C)  TempSrc: Oral  Oral Oral  SpO2: 97%  98% 99%  Weight:  57.5 kg    Height:        Gen: Awake, Alert, Oriented X 3, chronically ill, no distress HEENT: no JVD Lungs: Good air movement bilaterally, CTAB CVS: S1S2/RRR Abd: soft, Non tender, distended, bowel sounds present Extremities: Trace edema, synovitis of 2 fingers of her right hand Skin: no new rashes on exposed skin   Intake/Output Summary (Last 24 hours) at 01/15/2024 1104 Last data filed at 01/15/2024 0900 Gross per 24 hour  Intake 600 ml  Output 650 ml  Net -50 ml   Filed Weights   01/13/24 0400 01/14/24 0412 01/15/24 0500  Weight: 53.8 kg 55.5 kg 57.5 kg    Scheduled Meds:  feeding supplement  237 mL Oral BID BM   fluticasone  1 spray Each Nare Daily   lactulose  20 g Oral TID   levothyroxine  125 mcg Oral Q0600   metoprolol  succinate  25 mg Oral Daily   midodrine  5 mg Oral TID WC   predniSONE  40 mg Oral Q breakfast   rifaximin  550 mg Oral BID   sodium chloride flush  3 mL Intravenous Q12H   spironolactone   25 mg Oral Daily   torsemide   40 mg Oral Daily   warfarin  2.5 mg Oral q1600   Warfarin - Pharmacist Dosing Inpatient   Does not apply q1600   Continuous Infusions:  albumin human      Nutritional status Signs/Symptoms: severe fat depletion, severe muscle depletion Interventions: Magic cup (Mighty Shakes) Body mass index is 23.95 kg/m.  Data Reviewed:   CBC: Recent Labs  Lab 01/09/24 0249 01/10/24 0231 01/11/24 0226  WBC 6.4 7.6 6.9  HGB 9.3* 9.4* 9.2*  HCT 26.6* 27.4* 26.7*  MCV 78.0* 78.3* 79.2*  PLT 128* 149* 156   Basic Metabolic Panel: Recent Labs  Lab  01/09/24 0249 01/10/24 0231 01/11/24 0226 01/12/24 0907 01/13/24 0246 01/14/24 0301 01/14/24 1543 01/15/24 0150  NA 123* 124* 123* 123* 124* 122* 123* 124*  K 3.8 3.9 4.1 4.5 4.6 4.1 4.4 4.1  CL 91* 92* 92* 91* 90* 89* 93* 91*  CO2 23 24 23 23 23 23  20* 23  GLUCOSE 88 90 87 118* 85 84 95 71  BUN 44* 40* 34* 31* 28* 26* 26* 26*  CREATININE 1.34* 1.29* 1.12* 1.31* 1.24* 1.22* 1.10* 1.19*  CALCIUM 7.8* 7.9* 7.8* 7.6* 8.0* 7.8* 8.1* 7.9*  MG 1.7 2.0 1.9  --   --   --   --   --   PHOS 2.7  --  2.5  --   --   --   --   --    GFR: Estimated Creatinine Clearance: 31.3 mL/min (A) (by C-G formula based on SCr of 1.19 mg/dL (H)). Liver Function Tests: Recent Labs  Lab 01/10/24 0231 01/11/24 0226 01/12/24 0907 01/13/24 0246 01/14/24 0301 01/15/24 0150  AST 35  --  38 43* 39 36  ALT 11  --  10 12 11 12   ALKPHOS 67  --  63 79 70 74  BILITOT 2.3*  --  1.9* 1.9* 1.7* 1.5*  PROT 6.0*  --  6.4* 7.1 6.3* 6.2*  ALBUMIN 2.0* 2.2* 2.2* 2.4* 2.3* 2.2*   No results for input(s): LIPASE, AMYLASE in the last 168 hours. Recent Labs  Lab 01/09/24 0249  AMMONIA 41*   Coagulation Profile: Recent Labs  Lab 01/11/24 0226 01/12/24 0220 01/13/24 0246 01/14/24 0301 01/15/24 0150  INR 2.7* 3.0* 2.8* 2.9* 2.7*   Cardiac Enzymes: No results for input(s): CKTOTAL, CKMB, CKMBINDEX, TROPONINI in the last 168 hours. BNP (last 3 results) No results for input(s): PROBNP in the last 8760 hours. HbA1C: No results for input(s): HGBA1C in the last 72 hours. CBG: No results for input(s): GLUCAP in the last 168 hours. Lipid Profile: No results for input(s): CHOL, HDL, LDLCALC, TRIG, CHOLHDL, LDLDIRECT in the last 72 hours. Thyroid Function Tests: No results for input(s): TSH, T4TOTAL, FREET4, T3FREE, THYROIDAB in the last 72 hours. Anemia Panel: No results for input(s): VITAMINB12, FOLATE, FERRITIN, TIBC, IRON, RETICCTPCT in the last 72  hours. Sepsis Labs: No results for input(s): PROCALCITON, LATICACIDVEN in the last 168 hours.  No results found for this or any previous visit (from the past 240 hours).     LOS: 16 days   Time spent= 35 mins    Sigurd Pac, MD [facility]  If 7PM-7AM, please contact night-coverage  01/15/2024, 11:04 AM

## 2024-01-15 NOTE — Plan of Care (Signed)

## 2024-01-15 NOTE — Progress Notes (Signed)
 PHARMACY - ANTICOAGULATION CONSULT NOTE  Pharmacy Consult for Heparin Indication: mechanical mitral valve   No Known Allergies  Patient Measurements: Height: 5' 1 (154.9 cm) Weight: 57.5 kg (126 lb 12.2 oz) IBW/kg (Calculated) : 47.8 HEPARIN DW (KG): 60.3  Vital Signs: Temp: 98 F (36.7 C) (10/26 0712) Temp Source: Oral (10/26 0712) BP: 105/54 (10/26 0712) Pulse Rate: 92 (10/26 0712)  Labs: Recent Labs    01/13/24 0246 01/14/24 0301 01/14/24 1543 01/15/24 0150  LABPROT 30.5* 31.7*  --  30.3*  INR 2.8* 2.9*  --  2.7*  CREATININE 1.24* 1.22* 1.10* 1.19*   Estimated Creatinine Clearance: 31.3 mL/min (A) (by C-G formula based on SCr of 1.19 mg/dL (H)).  Assessment: Patient is a 78 YO F presenting with abdominal distension, peripheral edema, and fatigue. 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 According to last anticoag clinic note on 9/25, warfarin regimen was 2.5 mg tablet daily and INR was 3 at that time. Warfarin on hold   Now s/p cath 10/13 and back on warfarin -INR today 2.7   Goal of Therapy:  INR goal 2.5-3.5 Monitor platelets by anticoagulation protocol: Yes   Plan:  -Warfarin 2.5 mg po daily -Daily PT/INR  Thank you for allowing pharmacy to be involved with this patient's care.  R. Samual Satterfield, PharmD PGY-1 Acute Care Pharmacy Resident [facility] Please refer to [facility] for [facility] Pharmacy numbers 01/15/2024 8:32 AM

## 2024-01-16 DIAGNOSIS — E871 Hypo-osmolality and hyponatremia: Secondary | ICD-10-CM | POA: Diagnosis not present

## 2024-01-16 LAB — BASIC METABOLIC PANEL WITH GFR
Anion gap: 9 (ref 5–15)
BUN: 28 mg/dL — ABNORMAL HIGH (ref 8–23)
CO2: 23 mmol/L (ref 22–32)
Calcium: 7.8 mg/dL — ABNORMAL LOW (ref 8.9–10.3)
Chloride: 90 mmol/L — ABNORMAL LOW (ref 98–111)
Creatinine, Ser: 1.31 mg/dL — ABNORMAL HIGH (ref 0.44–1.00)
GFR, Estimated: 41 mL/min — ABNORMAL LOW (ref >60)
Glucose, Bld: 134 mg/dL — ABNORMAL HIGH (ref 70–99)
Potassium: 4.3 mmol/L (ref 3.5–5.1)
Sodium: 122 mmol/L — ABNORMAL LOW (ref 135–145)

## 2024-01-16 LAB — PROTIME-INR
INR: 3 — ABNORMAL HIGH (ref 0.8–1.2)
Prothrombin Time: 32.6 s — ABNORMAL HIGH (ref 11.4–15.2)

## 2024-01-16 MED ORDER — PREDNISONE 20 MG PO TABS: 20.0000 mg | ORAL_TABLET | Freq: Every day | ORAL | Status: DC

## 2024-01-16 MED ORDER — WARFARIN SODIUM 5 MG PO TABS: ORAL_TABLET | ORAL | 1 refills | Status: AC

## 2024-01-16 MED ORDER — LACTULOSE 10 GM/15ML PO SOLN: 20.0000 g | Freq: Two times a day (BID) | ORAL | Status: DC

## 2024-01-16 NOTE — Progress Notes (Signed)
 This clinical research associate has attempted to call [facility] three times without an answer. Voicemail left with call back number. Will continue to monitor.

## 2024-01-16 NOTE — TOC Transition Note (Signed)
 Transition of Care [facility]) - Discharge Note   Patient Details  Name: Chelsea Boyer MRN: [id number] Date of Birth: [birth year]  Transition [facility] CM/SW Contact:  Luise JAYSON Pan, LCSWA Phone Number: 01/16/2024, 10:03 AM   Clinical Narrative:   Patient will DC to: [facility] Anticipated DC date: 01/16/24  Family notified: [name] (dtr)  Transport by: Family    Per MD patient ready for DC to [facility]. RN to call report prior to discharge [phone]; room 504a). RN, patient, patient's family, and facility notified of DC. Discharge Summary and FL2 sent to facility.  CSW will sign off for now as social work intervention is no longer needed. Please consult us  again if new needs arise.  Final next level of care: Skilled Nursing Facility Barriers to Discharge: Barriers Resolved   Patient Goals and CMS Choice Patient states their goals for this hospitalization and ongoing recovery are:: to get better          Discharge Placement PASRR number recieved: 01/10/24            Patient chooses bed at: [facility] Patient to be transferred to facility by: Family Jesusa - dtr and [place] - spouse) Name of family member notified: [name] (dtr) Patient and family notified of of transfer: 01/16/24  Discharge Plan and Services Additional resources added to the After Visit Summary for   In-house Referral: Clinical Social Work Discharge Planning Services: CM Consult                      HH Arranged: PT HH Agency: [facility] Date [facility] Agency Contacted: 12/30/23   Representative spoke with at [facility] Agency: Darleene  Social Drivers of Health (SDOH) Interventions SDOH Screenings   Food Insecurity: No Food Insecurity (12/30/2023)  Housing: Low Risk  (12/30/2023)  Transportation Needs: No Transportation Needs (12/30/2023)  Utilities: Not At Risk (12/30/2023)  Social Connections: Patient Declined (12/30/2023)  Tobacco Use: Low Risk  (01/14/2024)      Readmission Risk Interventions     No data to display

## 2024-01-16 NOTE — Progress Notes (Addendum)
 Have attempted two more additional times to reach [facility] with no success. LCSW gave facility RN call back number.

## 2024-01-16 NOTE — Discharge Summary (Addendum)
 Physician Discharge Summary  Chelsea Boyer FMW:[id number] DOB: 11-03-1945 DOA: 12/29/2023  PCP: Chrystal Lamarr RAMAN, MD  Admit date: 12/29/2023 Discharge date: 01/16/2024  Time spent: 45 minutes  Recommendations for Outpatient Follow-up:  SNF with palliative care, continue goals of care discussions, poor prognosis Cardiology Dr. 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   Hyperkalemia   Encephalopathy, hepatic (HCC)   Stenosis of prosthetic aortic valve   RVF (right ventricular failure) (HCC)   Protein-calorie malnutrition, severe   AKI (acute kidney injury)   Discharge Condition: Fair  Diet recommendation: Low sodium, heart healthy  Filed Weights   01/14/24 0412 01/15/24 0500 01/16/24 0430  Weight: 55.5 kg 57.5 kg 55.7 kg    History of present illness:  78 year old F with PMH of NASH cirrhosis, chronic HFpEF/RV failure, A-fib and 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 CT abdomen and pelvis showed liver cirrhosis with large volume ascites.  CXR without acute finding.  Patient was started on diuretics and also  underwent paracentesis with removal of 10 L on 10/10.  Peritoneal fluid negative for SBP.  Cardiology consulted.  TTE with LVEF of 55 to 60%, RVSP of 77, severely elevated PASP, mechanical MVR, severe TR, TAVR valve with moderate to severe stenosis.  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  [name].  CODE STATUS changed to DNR.   [name].  Underwent repeat para with removal of 4.3 L on 10/19.  Stable for discharge.  Family interested in short-term rehab.  Hospital Course:   Acute on chronic HFpEF/RV failure/severe PAH:  Echocardiogram showing EF of 60% with RVSP of 77, elevated PASP, mechanical MVR, severe TR, TAVR valve with moderate to severe stenosis.  Underwent RHC on 10/13 showing pulmonary hypertension.   -Improved with diuresis and paracentesis - Cardiac medication recommendation-Toprol -XL, midodrine, Aldactone , torsemide  and Coumadin  -Stable overall but poor prognosis, palliative consulted, hospice discussed, patient wishes to attempt short-term rehab with outpatient palliative care -Follow-up with [facility] heart care    History of TAVR-now with moderate to severe aortic stenosis of prosthetic valve. 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   -Diuretics as above -On rifaximin, lactulose   Gout flare, uric acid is 9, ESR 60,  - Improving on prednisone, continue for 3 more days   CKD-3B: Some element of hepatorenal/cardiorenal syndrome.  Stable.  Creatinine 1.3    Hypotension: BP improved. - Continue midodrine   Chronic hyponatremia - Likely in  the setting of cirrhosis.  Sodium stable around 124.  Continue to monitor, asymptomatic, repeat sodium this afternoon   Chronic A-fib: Rate controlled.  On warfarin for AC.  -Continue Toprol -XL  - Anticoagulation as above.   Hypothyroidism -Continue Synthroid   Normocytic anemia: H&H relatively stable.  No overt bleeding. -Continue monitoring   Debility/deconditioning: Patient too deconditioned.  Family interested in short-term rehab. - Therapy and TOC notified.   Right nose epistaxis: Mild.  Resolved.   Thrombocytopenia: Mild and stable. - Continue monitoring   Palliative care/goals of care-poor prognosis. -DNI DNR -Palliative following    Discharge Exam: Vitals:   01/16/24 0810 01/16/24 1143  BP: (!) 100/49 (!) 113/55  Pulse: 75 71  Resp: 18   Temp: 97.7 F (36.5 C)   SpO2: 94%    Gen: Awake, Alert, Oriented X 3, chronically ill, no distress HEENT: no JVD Lungs: Good air movement bilaterally, CTAB CVS: S1S2/RRR Abd: soft, Non tender, distended, bowel sounds present Extremities: Trace edema, synovitis of 2 fingers of her right hand Skin: no new rashes on exposed skin   Discharge Instructions    Allergies as of 01/16/2024   No Known Allergies      Medication List     STOP taking these medications    amoxicillin  500 MG capsule Commonly known as: AMOXIL        TAKE these medications    alendronate 70 MG tablet Commonly known as: FOSAMAX Take 70 mg by mouth once a week.   fluticasone 50 MCG/ACT nasal spray Commonly known as: FLONASE Place 1 spray into both nostrils daily.   lactulose 10 GM/15ML solution Commonly known as: CHRONULAC Take 30 mLs (20 g total) by mouth 2 (two) times daily.   levothyroxine 125 MCG tablet Commonly known as: SYNTHROID Take 125 mcg by mouth every morning.   metoprolol  succinate 25 MG 24 hr tablet Commonly known as: TOPROL -XL Take 1 tablet (25 mg total) by mouth daily. What changed:  medication strength See the  new instructions.   midodrine 5 MG tablet Commonly known as: PROAMATINE Take 1 tablet (5 mg total) by mouth 3 (three) times daily with meals.   predniSONE 20 MG tablet Commonly known as: DELTASONE Take 1-2 tablets (20-40 mg total) by mouth daily with breakfast. Take 40mg  for 1day then 20mg  for 2days then STOP Start taking on: January 17, 2024   rifaximin 550 MG Tabs tablet Commonly known as: XIFAXAN Take 1 tablet (550 mg total) by mouth 2 (two) times daily.   spironolactone  25 MG tablet Commonly known as: ALDACTONE  Take 1 tablet (25 mg total) by mouth daily. What changed: when to take this   Torsemide  40 MG Tabs Take 40 mg by mouth daily. What changed:  medication strength See the new instructions.   Vitamin D3 50 MCG (2000 UT) Tabs Take 6,000 Units by mouth daily.   warfarin 5 MG tablet Commonly known as: COUMADIN  Take as directed. If you are unsure how to take this medication, talk to your nurse or doctor. Original instructions: TAKE 1/2 TABLET (2.5mg ) BY MOUTH DAILY AS DIRECTED BY COUMADIN  CLINIC What changed: See the new instructions.       No Known Allergies  Contact information for after-discharge care     Destination     [facility] .   Service: Skilled Nursing Contact information: [address] [place]  [phone]  The results of significant diagnostics from this hospitalization (including imaging, microbiology, ancillary and laboratory) are listed below for reference.    Significant Diagnostic Studies: US  Paracentesis Result Date: 01/08/2024 INDICATION: Patient with a history of NASH cirrhosis with recurrent ascites. Therapeutic paracentesis requested. EXAM: ULTRASOUND GUIDED PARACENTESIS MEDICATIONS: 1% lidocaine 10 mL COMPLICATIONS: None immediate. PROCEDURE: Informed written consent was obtained from the patient after a discussion of the risks, benefits and alternatives to treatment. A  timeout was performed prior to the initiation of the procedure. Initial ultrasound scanning demonstrates a large amount of ascites within the right lower abdominal quadrant. The right lower abdomen was prepped and draped in the usual sterile fashion. 1% lidocaine was used for local anesthesia. Following this, a 19 gauge, 7-cm, Yueh catheter was introduced. An ultrasound image was saved for documentation purposes. The paracentesis was performed. The catheter was removed and a dressing was applied. The patient tolerated the procedure well without immediate post procedural complication. Patient received post-procedure intravenous albumin; see nursing notes for details. FINDINGS: A total of approximately 4.3 L of clear yellow fluid was removed. Procedure stopped early due to hypotension. IMPRESSION: Successful ultrasound-guided paracentesis yielding 4.3 liters of peritoneal fluid. Procedure performed by: Warren Dais, NP PLAN: The patient has required >/=2 paracenteses in a 30 day period and a formal evaluation by the [facility] has been arranged. Electronically Signed   By: Wilkie Lent M.D.   On: 01/08/2024 12:12   US  Abdomen Limited Result Date: 01/07/2024 EXAM: LIMITED ABDOMINAL ULTRASOUND FOR ASCITES EVALUATION TECHNIQUE: Limited real-time sonography of all 4 quadrants of the abdomen was performed for evaluation of ascites. COMPARISON: CT abdomen and pelvis with iv contrast 12/29/2023. CLINICAL HISTORY: Ascites. FINDINGS: RIGHT UPPER QUADRANT: Large volume ascites is present. LEFT UPPER QUADRANT: Large volume ascites is present. RIGHT LOWER QUADRANT: Large volume ascites is present. LEFT LOWER QUADRANT: Large volume ascites is present. The largest pocket is in this quadrant and measures up to 18 cm craniocaudal and nearly 11 cm in depth. OTHER: Limited visualization of the rest of the abdomen demonstrates no acute abnormality. The visualized liver is  nodular and cirrhotic. IMPRESSION: 1. Large volume ascites, largest pocket in the left lower quadrant. 2. Cirrhosis of the liver. 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 Preserved cardiac output by assumed Fick Significant RV dysfunction based on CVP tracing Unable to access coronaries from a radial approach RECOMMENDATIONS: PVR likely prohibitive for TAVR, not a candidate for vasodilators with mitral valve disease Discussed results with general cardiology   ECHOCARDIOGRAM COMPLETE Result Date: 12/30/2023    ECHOCARDIOGRAM REPORT   Patient Name:   Chelsea Boyer Date of Exam: 12/30/2023 Medical Rec #:  [id number]      Height:       61.0 in Accession #:    [id number]     Weight:       140.0 lb Date of Birth:  12-24-1945  BSA:          1.623 m Patient Age:    78 years       BP:           112/61 mmHg Patient Gender: F              HR:           82 bpm. Exam Location:  Inpatient Procedure: 2D Echo (Both Spectral and Color Flow Doppler were utilized during            procedure). Indications:    CHF  History:        Patient has prior history of Echocardiogram examinations. CHF;                 Aortic Valve Disease and Mitral Valve Disease.                 Aortic Valve: 23 mm Sapien prosthetic, stented (TAVR) valve is                 present in the aortic position.                 Mitral Valve: 25 mm St. Jude mechanical valve valve is present                 in the mitral position.  Sonographer:    Norleen Amour Referring Phys: [id number] ANASTASSIA DOUTOVA IMPRESSIONS  1. Left  ventricular ejection fraction, by estimation, is 55 to 60%. The left ventricle has normal function. The left ventricle has no regional wall motion abnormalities. There is mild left ventricular hypertrophy. Left ventricular diastolic function could not be evaluated.  2. Right ventricular systolic function is moderately reduced. The right ventricular size is severely enlarged. There is severely elevated pulmonary artery systolic pressure. The estimated right ventricular systolic pressure is 76.8 mmHg.  3. Left atrial size was severely dilated.  4. Right atrial size was severely dilated.  5. The mitral valve has been repaired/replaced. No evidence of mitral valve regurgitation. The mean mitral valve gradient is 7.7 mmHg. There is a 25 mm St. Jude mechanical valve present in the mitral position.  6. Tricuspid valve regurgitation is severe.  7. The aortic valve has been repaired/replaced. There is moderate calcification of the aortic valve. There is moderate thickening of the aortic valve. Aortic valve regurgitation is mild. Moderate to severe aortic valve stenosis. There is a 23 mm Sapien prosthetic (TAVR) valve present in the aortic position.  8. The inferior vena cava is dilated in size with <50% respiratory variability, suggesting right atrial pressure of 15 mmHg. Comparison(s): Changes from prior study are noted. Aortic valve gradient has ranged from 15-20 mmHg since at least 2019 based on reports, with increase to 23.5 mmHg last year and now 33 mmHg. Visually TAVR leaflets are calcified and restricted. Stroke volume is low, which likely underestimates gradients. Suspect based on AVA and DI that TAVR has moderate to severe stenosis. FINDINGS  Left Ventricle: Left ventricular ejection fraction, by estimation, is 55 to 60%. The left ventricle has normal function. The left ventricle has no regional wall motion abnormalities. The left ventricular internal cavity size was normal in size. There is  mild left ventricular  hypertrophy. Abnormal (paradoxical) septal motion consistent with post-operative status. Left ventricular diastolic function could not be evaluated due to mitral valve replacement. Left ventricular diastolic function could not be evaluated. Right Ventricle: The right ventricular size is severely enlarged. Right vetricular wall  thickness was not well visualized. Right ventricular systolic function is moderately reduced. There is severely elevated pulmonary artery systolic pressure. The tricuspid regurgitant velocity is 3.93 m/s, and with an assumed right atrial pressure of 15 mmHg, the estimated right ventricular systolic pressure is 76.8 mmHg. Left Atrium: Left atrial size was severely dilated. Right Atrium: Right atrial size was severely dilated. Pericardium: There is no evidence of pericardial effusion. Mitral Valve: The mitral valve has been repaired/replaced. No evidence of mitral valve regurgitation. There is a 25 mm St. Jude mechanical valve present in the mitral position. MV peak gradient, 17.6 mmHg. The mean mitral valve gradient is 7.7 mmHg. Tricuspid Valve: The tricuspid valve is normal in structure. Tricuspid valve regurgitation is severe. No evidence of tricuspid stenosis. Aortic Valve: Thickened/calcified leaflets of TAVR valve with restricted motion. The aortic valve has been repaired/replaced. There is moderate calcification of the aortic valve. There is moderate thickening of the aortic valve. Aortic valve regurgitation is mild. Aortic regurgitation PHT measures 550 msec. Moderate to severe aortic stenosis is present. Aortic valve mean gradient measures 33.0 mmHg. Aortic valve peak gradient measures 45.9 mmHg. Aortic valve area, by VTI measures 0.72 cm. There is a 23 mm Sapien prosthetic, stented (TAVR) valve present in the aortic position. Pulmonic Valve: The pulmonic valve was grossly normal. Pulmonic valve regurgitation is mild. No evidence of pulmonic stenosis. Aorta: The aortic root, ascending  aorta, aortic arch and descending aorta are all structurally normal, with no evidence of dilitation or obstruction. Venous: The inferior vena cava is dilated in size with less than 50% respiratory variability, suggesting right atrial pressure of 15 mmHg. IAS/Shunts: The atrial septum is grossly normal.  LEFT VENTRICLE PLAX 2D LVIDd:         4.50 cm LVIDs:         3.00 cm LV PW:         1.10 cm LV IVS:        1.20 cm LVOT diam:     2.10 cm LV SV:         58 LV SV Index:   36 LVOT Area:     3.46 cm  LV Volumes (MOD) LV vol d, MOD A2C: 166.0 ml LV vol d, MOD A4C: 101.0 ml LV vol s, MOD A2C: 62.3 ml LV vol s, MOD A4C: 46.4 ml LV SV MOD A2C:     103.7 ml LV SV MOD A4C:     101.0 ml LV SV MOD BP:      87.5 ml RIGHT VENTRICLE            IVC RV Basal diam:  5.00 cm    IVC diam: 2.20 cm RV S prime:     8.05 cm/s TAPSE (M-mode): 1.5 cm LEFT ATRIUM             Index        RIGHT ATRIUM           Index LA diam:        5.90 cm 3.64 cm/m   RA Area:     27.80 cm LA Vol (A2C):   91.0 ml 56.07 ml/m  RA Volume:   101.00 ml 62.23 ml/m LA Vol (A4C):   57.1 ml 35.18 ml/m LA Biplane Vol: 76.3 ml 47.01 ml/m  AORTIC VALVE                     PULMONIC VALVE AV Area (Vmax):    0.68 cm  PV Vmax:       1.40 m/s AV Area (Vmean):   0.68 cm      PV Peak grad:  7.8 mmHg AV Area (VTI):     0.72 cm AV Vmax:           338.80 cm/s AV Vmean:          235.600 cm/s AV VTI:            0.804 m AV Peak Grad:      45.9 mmHg AV Mean Grad:      33.0 mmHg LVOT Vmax:         66.37 cm/s LVOT Vmean:        46.500 cm/s LVOT VTI:          0.168 m LVOT/AV VTI ratio: 0.21 AI PHT:            550 msec  AORTA Ao Root diam: 2.70 cm Ao Asc diam:  2.80 cm MITRAL VALVE               TRICUSPID VALVE MV Area (PHT): 2.27 cm    TR Peak grad:   61.8 mmHg MV Area VTI:   1.85 cm    TR Vmax:        393.00 cm/s MV Peak grad:  17.6 mmHg MV Mean grad:  7.7 mmHg    SHUNTS MV Vmax:       2.10 m/s    Systemic VTI:  0.17 m MV Vmean:      122.4 cm/s  Systemic Diam: 2.10 cm  Shelda Bruckner MD Electronically signed by Shelda Bruckner MD Signature Date/Time: 12/30/2023/1:41:41 PM    Final    IR Paracentesis Result Date: 12/30/2023 INDICATION: Hx of NASH/MASH cirrhosis with ascites. Consult for her first diagnostic and therapeutic paracentesis. EXAM: ULTRASOUND GUIDED DIAGNOSTIC AND THERAPEUTIC PARACENTESIS MEDICATIONS: 7 mL 1% lidocaine COMPLICATIONS: None immediate. PROCEDURE: Informed written consent was obtained from the patient after a discussion of the risks, benefits and alternatives to treatment. A timeout was performed prior to the initiation of the procedure. Initial ultrasound scanning demonstrates a large amount of ascites within the right lower abdominal quadrant. The right lower abdomen was prepped and draped in the usual sterile fashion. 1% lidocaine was used for local anesthesia. Following this, a 19 gauge, 7-cm, Yueh catheter was introduced. An ultrasound image was saved for documentation purposes. The paracentesis was performed. The catheter was removed and a dressing was applied. The patient tolerated the procedure well without immediate post procedural complication. Patient received post-procedure intravenous albumin; see nursing notes for details. FINDINGS: A total of approximately 10 liters of hazy dark yellow fluid was removed. Samples were sent to the laboratory as requested by the clinical team. IMPRESSION: Successful ultrasound-guided paracentesis yielding 10 liters of peritoneal fluid. Performed by: Kimble Clas, PA-C Electronically Signed   By: Cordella Banner   On: 12/30/2023 12:35   CT ABDOMEN PELVIS WO CONTRAST Result Date: 12/29/2023 CLINICAL DATA:  Acute abdominal pain.  Fatigue.  Swelling. EXAM: CT ABDOMEN AND PELVIS WITHOUT CONTRAST TECHNIQUE: Multidetector CT imaging of the abdomen and pelvis was performed following the standard protocol without IV contrast. RADIATION DOSE REDUCTION: This exam was performed according to the  departmental dose-optimization program which includes automated exposure control, adjustment of the mA and/or kV according to patient size and/or use of iterative reconstruction technique. COMPARISON:  Abdominal ultrasound 08/18/2023 FINDINGS: Lower chest: Small left and trace right pleural effusion. The heart is enlarged. Circumferential left atrial calcifications.  Mitral valve replacement, aortic valve replacement. Hepatobiliary: Hepatic cirrhosis with nodular contours. No obvious focal liver lesion on this unenhanced exam. Calcified gallstones within decompressed gallbladder. Pancreas: No ductal dilatation or inflammation. Spleen: No splenomegaly. The spleen is small in size measuring 7.8 cm in length. Adrenals/Urinary Tract: No adrenal nodule. Bilateral renal parenchymal thinning. No hydronephrosis. No renal calculi. Urinary bladder is only minimally distended. Stomach/Bowel: Bowel assessment is limited due to the presence of ascites. The stomach is nondistended. No small bowel obstruction or abnormal distention. The appendix is not confidently visualized. Small volume of formed stool in the colon. Short segment of transverse colon extends into a umbilical hernia. No associated wall thickening or obstruction. Sigmoid colonic diverticulosis without diverticulitis. Vascular/Lymphatic: Aortic atherosclerosis. No aortic aneurysm. Suspected retroperitoneal collaterals. Ascites limits assessment for adenopathy, no gross bulky enlarged nodes. Reproductive: Uterus and bilateral adnexa are unremarkable. Other: Large volume abdominopelvic ascites. Ascites extends into an umbilical hernia. Ascitic fluid appears simple. There is generalized body wall edema. No free air. Musculoskeletal: Left hip arthroplasty. Lower lumbar facet hypertrophy. IMPRESSION: 1. Hepatic cirrhosis with large volume abdominopelvic ascites. 2. Cholelithiasis. 3. Short segment of transverse colon extends into a umbilical hernia. No associated wall  thickening or obstruction. 4. Sigmoid colonic diverticulosis without diverticulitis. 5. Small left and trace right pleural effusions. Generalized body wall edema. Aortic Atherosclerosis (ICD10-I70.0). Electronically Signed   By: Andrea Gasman M.D.   On: 12/29/2023 17:24   CT Head Wo Contrast Result Date: 12/29/2023 EXAM: CT HEAD WITHOUT CONTRAST 12/29/2023 04:24:56 PM TECHNIQUE: CT of the head was performed without the administration of intravenous contrast. Automated exposure control, iterative reconstruction, and/or weight based adjustment of the mA/kV was utilized to reduce the radiation dose to as low as reasonably achievable. COMPARISON: None available. CLINICAL HISTORY: Mental status change, unknown cause. Table formatting from the original note was not included.; Pt BIB [facility] from home c/o confusion, fatigue, and swelling. FINDINGS: BRAIN AND VENTRICLES: No acute hemorrhage. No evidence of acute infarct. No hydrocephalus. No extra-axial collection. No mass effect or midline shift. Remote right cerebellar infarct. Patchy white matter hyperintensities, compatible with chronic small vessel ischemic change. ORBITS: No acute abnormality. SINUSES: No acute abnormality. SOFT TISSUES AND SKULL: No acute soft tissue abnormality. No skull fracture. IMPRESSION: 1. No acute intracranial abnormality. 2. 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 Electronically signed by: Gilmore Molt MD 12/29/2023 05:11 PM EDT RP Workstation: [id number]   DG Chest Port 1 View Result Date: 12/29/2023 CLINICAL DATA:  Altered mental status. EXAM: PORTABLE CHEST 1 VIEW COMPARISON:  Aug 12, 2020. FINDINGS: Stable cardiomegaly. Sternotomy wires are noted. Status post transcatheter aortic valve repair. No acute pulmonary disease is noted. Bony thorax is unremarkable. IMPRESSION: No acute abnormality seen. Electronically Signed   By: Lynwood Landy Raddle M.D.   On: 12/29/2023 16:58    Microbiology: No  results found for this or any previous visit (from the past 240 hours).   Labs: Basic Metabolic Panel: Recent Labs  Lab 01/10/24 0231 01/11/24 0226 01/12/24 0907 01/13/24 0246 01/14/24 0301 01/14/24 1543 01/15/24 0150 01/16/24 0217  NA 124* 123*   < > 124* 122* 123* 124* 122*  K 3.9 4.1   < > 4.6 4.1 4.4 4.1 4.3  CL 92* 92*   < > 90* 89* 93* 91* 90*  CO2 24 23   < > 23 23 20* 23 23  GLUCOSE 90 87   < > 85 84 95 71 134*  BUN 40* 34*   < >  28* 26* 26* 26* 28*  CREATININE 1.29* 1.12*   < > 1.24* 1.22* 1.10* 1.19* 1.31*  CALCIUM 7.9* 7.8*   < > 8.0* 7.8* 8.1* 7.9* 7.8*  MG 2.0 1.9  --   --   --   --   --   --   PHOS  --  2.5  --   --   --   --   --   --    < > = values in this interval not displayed.   Liver Function Tests: Recent Labs  Lab 01/10/24 0231 01/11/24 0226 01/12/24 0907 01/13/24 0246 01/14/24 0301 01/15/24 0150  AST 35  --  38 43* 39 36  ALT 11  --  10 12 11 12   ALKPHOS 67  --  63 79 70 74  BILITOT 2.3*  --  1.9* 1.9* 1.7* 1.5*  PROT 6.0*  --  6.4* 7.1 6.3* 6.2*  ALBUMIN 2.0* 2.2* 2.2* 2.4* 2.3* 2.2*   No results for input(s): LIPASE, AMYLASE in the last 168 hours. No results for input(s): AMMONIA in the last 168 hours. CBC: Recent Labs  Lab 01/10/24 0231 01/11/24 0226  WBC 7.6 6.9  HGB 9.4* 9.2*  HCT 27.4* 26.7*  MCV 78.3* 79.2*  PLT 149* 156   Cardiac Enzymes: No results for input(s): CKTOTAL, CKMB, CKMBINDEX, TROPONINI in the last 168 hours. BNP: BNP (last 3 results) Recent Labs    12/29/23 1537  BNP 639.2*    ProBNP (last 3 results) No results for input(s): PROBNP in the last 8760 hours.  CBG: No results for input(s): GLUCAP in the last 168 hours.     Signed:  Sigurd Pac MD.  [facility] 01/16/2024, 12:55 PM

## 2024-01-16 NOTE — Care Management Important Message (Signed)
 Important Message  Patient Details  Name: NIKERIA KALMAN MRN: [id number] Date of Birth: 1945/11/16   Important Message Given:  Yes - Medicare IM     [name] 01/16/2024, 11:05 AM

## 2024-01-16 NOTE — Progress Notes (Signed)
 AVS teaching given to patient, patients spouse & daughter. All questions answered. PIV removed. Patient being transported to [facility] via family in private vehicle. Discharged in stable condition.

## 2024-01-16 NOTE — TOC Progression Note (Signed)
 Transition of Care [facility]) - Progression Note    Patient Details  Name: Chelsea Boyer MRN: [id number] Date of Birth: 01/01/1946  Transition of Care [facility]) CM/SW Contact  Luise JAYSON Pan, [place] Phone Number: 01/16/2024, 9:04 AM  Clinical Narrative:   Per [facility], patient can DC today. Family can transport family around noon today. CSW notified treatment team.   CSW will continue to follow.    Expected Discharge Plan: Skilled Nursing Facility Barriers to Discharge: Continued Medical Work up, SNF Pending bed offer               Expected Discharge Plan and Services In-house Referral: Clinical Social Work Discharge Planning Services: CM Consult   Living arrangements for the past 2 months: Skilled Nursing Facility                           HH Arranged: PT [facility] Agency: [facility] Date [facility] Agency Contacted: 12/30/23   Representative spoke with at [facility] Agency: Darleene   Social Drivers of Health (SDOH) Interventions SDOH Screenings   Food Insecurity: No Food Insecurity (12/30/2023)  Housing: Low Risk  (12/30/2023)  Transportation Needs: No Transportation Needs (12/30/2023)  Utilities: Not At Risk (12/30/2023)  Social Connections: Patient Declined (12/30/2023)  Tobacco Use: Low Risk  (01/14/2024)    Readmission Risk Interventions     No data to display

## 2024-01-16 NOTE — Progress Notes (Signed)
 PHARMACY - ANTICOAGULATION CONSULT NOTE  Pharmacy Consult for Heparin Indication: mechanical mitral valve   No Known Allergies  Patient Measurements: Height: 5' 1 (154.9 cm) Weight: 55.7 kg (122 lb 12.8 oz) IBW/kg (Calculated) : 47.8 HEPARIN DW (KG): 60.3  Vital Signs: Temp: 97.7 F (36.5 C) (10/27 0810) Temp Source: Oral (10/27 0810) BP: 113/55 (10/27 1143) Pulse Rate: 71 (10/27 1143)  Labs: Recent Labs    01/14/24 0301 01/14/24 1543 01/15/24 0150 01/16/24 0217  LABPROT 31.7*  --  30.3* 32.6*  INR 2.9*  --  2.7* 3.0*  CREATININE 1.22* 1.10* 1.19* 1.31*   Estimated Creatinine Clearance: 26.3 mL/min (A) (by C-G formula based on SCr of 1.31 mg/dL (H)).  Assessment: Patient is a 78 YO F presenting with abdominal distension, peripheral edema, and fatigue. 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 According to last anticoag clinic note on 9/25, warfarin regimen was 2.5 mg tablet daily and INR was 3 at that time. Warfarin on hold   Now s/p cath 10/13 and back on warfarin -INR today 3 at goal on warfarin 2.5mg  daily  - PTA regimen    Goal of Therapy:  INR goal 2.5-3.5 Monitor platelets by anticoagulation protocol: Yes   Plan:  -Continue Warfarin 2.5 mg po daily -Daily PT/INR   Olam Chalk Pharm.D. CPP, BCPS Clinical Pharmacist [phone] 01/16/2024 12:33 PM   Please refer to AMION for [facility] Pharmacy numbers 01/16/2024 12:32 PM

## 2024-01-18 ENCOUNTER — Telehealth: Payer: Self-pay | Admitting: *Deleted

## 2024-01-18 NOTE — Telephone Encounter (Signed)
 Called [facility] and spoke with Quanisha and she confirmed that the patient warfarin levels is being monitored and dosed by them at this time. She states it was last checked yesterday.

## 2024-01-18 NOTE — Telephone Encounter (Signed)
 Called [facility] since patient has been discharged to them per Hospital discharge and a message received from inpatient pharmacist to inquire if they are managing and dosing the patients warfarin level while there; there was no answer so had to leave a message to call back to confirm.

## 2024-01-18 NOTE — Telephone Encounter (Signed)
 Called [facility] but had to leave a message to call back regarding if they are managing her INR while there.

## 2024-01-19 DIAGNOSIS — I350 Nonrheumatic aortic (valve) stenosis: Secondary | ICD-10-CM | POA: Diagnosis not present

## 2024-01-19 DIAGNOSIS — I272 Pulmonary hypertension, unspecified: Secondary | ICD-10-CM | POA: Diagnosis not present

## 2024-01-19 DIAGNOSIS — K7460 Unspecified cirrhosis of liver: Secondary | ICD-10-CM | POA: Diagnosis not present

## 2024-01-19 DIAGNOSIS — I480 Paroxysmal atrial fibrillation: Secondary | ICD-10-CM | POA: Diagnosis not present

## 2024-01-19 DIAGNOSIS — D649 Anemia, unspecified: Secondary | ICD-10-CM | POA: Diagnosis not present

## 2024-01-19 DIAGNOSIS — I5033 Acute on chronic diastolic (congestive) heart failure: Secondary | ICD-10-CM | POA: Diagnosis not present

## 2024-02-13 ENCOUNTER — Telehealth: Payer: Self-pay | Admitting: Internal Medicine

## 2024-02-13 NOTE — Telephone Encounter (Signed)
 See office note from May when pt saw Jessica. Pt was Chelsea Boyer but is now assigned to Dr. Legrand.

## 2024-02-13 NOTE — Telephone Encounter (Signed)
 Pt weight 126 lbs at last appt - daughter is not aware what the weight is today.  Abd is distended, SOB with movement.  Last paracentesis was 1 month ago.  She would like para set up now. Ok to order?

## 2024-02-13 NOTE — Telephone Encounter (Signed)
 Inbound call from patient's daughter [name], stating in the past patient has had 2 paracentesis completed due to fluid build up due to heart failure. States she is noticing same fluid build up and would like to discuss scheduling paracentesis. Jennifer's best phone number is [phone]. Please advise, thank you

## 2024-02-14 ENCOUNTER — Telehealth: Payer: Self-pay

## 2024-02-14 NOTE — Telephone Encounter (Signed)
 [facility] nurse called states pt was discharged from SNF on Friday November 21.  [facility] is providing home health services at present.  INR was 3.8 on Thursday 11/20 prior to discharge.  Gave verbal order to check INR tomorrow 02/15/24 and call results to Coumadin  Clinic, gave direct number.  Pt placed on Home Health recheck list for tomorrow.

## 2024-02-14 NOTE — Telephone Encounter (Signed)
 Spoke with the pts daughter and she is aware of the recommendations per [name].  She states she will keep the appt with Harlene as planned and has an appt with PCP on 12/4.  She states that she was actually going to take the pt to the ED today. She weighed her and weight is up 20 pounds to 146.

## 2024-02-15 ENCOUNTER — Telehealth: Payer: Self-pay | Admitting: Physician Assistant

## 2024-02-15 ENCOUNTER — Telehealth: Payer: Self-pay | Admitting: *Deleted

## 2024-02-15 NOTE — Telephone Encounter (Signed)
 Called [facility] and had to leave a message. Also, called patient's line and spoke to the daughter and she stated they were due to come out today around 430-5pm and at this time no one has called or showed up. Advised that our Anticoagulation Clinic closes for the holiday today and reopens on Monday December 1st. She stated she knew it was going to be tight with trying to get this done. Advised I will be here for a few more minutes to see if I get a call from them.  Inquired how the patient is doing and she states she is hanging in there and taking things day by day. Told her to tell her hello from the Anticoagulation Clinic.   Hospital 12/29/23-01/16/24 then rehab and discharged 02/09/24

## 2024-02-15 NOTE — Telephone Encounter (Signed)
 Kintajah with [facility] called to relay INR 4.6 drawn today. She confirms patient is presently taking Coumadin  5mg  once daily and already took dose today. No bleeding or concerns reported by the patient at this time. Husband Lynwood was on 3 way call. Per his report, has had some return of fluid retention but otherwise feels fine and is not interested in coming back to the hospital at this time. She has not had any dietary changes. Husband is unsure at what point her Coumadin  was increased to 5mg  daily but has been on this since at least 11/21. INRs were being tracked by her SNF recently; we do not have full records. (Appears she was discharged in 12/2023 with recommendation for Coumadin  2.5mg  daily in pharmacist notes.)  The more recent values available in the chart are:  10/28 CareEverywhere - INR 2.4 11/8 CareEverywhere - INR 1.4  11/21 - per phone note yesterday by coumadin  clinic, [facility] nurse called states pt was discharged from SNF on Friday November 21. [facility] is providing home health services at present. INR was 3.8 on Thursday 11/20 prior to discharge. Gave verbal order to check INR tomorrow 02/15/24 and call results to Coumadin  Clinic, gave direct number. Pt placed on Home Health recheck list for tomorrow..  Chart reviewed and INR of 4.6 discussed with pharmacist at [facility]. [facility] nurse confirms they are able to recheck INR on Friday 11/28. Per pharmacist, we will have her hold her Coumadin  tomorrow (11/27) as well as tentatively on Friday (11/28) until INR recheck on Friday 11/28 to guide next steps for dosing. Our office is closed at that time, but they will call the answering service to review next steps on Friday. Nurse and patient's husband verbalized understanding of plan.  I will also forward this msg to the Coumadin  clinic team who was looking out for these results to follow for subsequent plans when office re-opens on Monday. Also noted that patient cancelled previous  follow-ups with Dr. Francyne, not rescheduled until January. When you relay anticoagulation instructions, please find out if patient open to sooner follow-up, would likely be with APP but keep 03/2024 appointment with Dr. Francyne.

## 2024-02-17 ENCOUNTER — Telehealth: Payer: Self-pay | Admitting: Physician Assistant

## 2024-02-17 NOTE — Telephone Encounter (Signed)
 Patient's husband contacted after-hours answering service regarding elevated INR performed by home health nurse.  INR today is 4.8, based on phone note from 11/26, INR was 4.6 at the time.  Patient denies any bleeding or recent head trauma.  I called and spoke with Fidela the home health nurse [phone]).  Given persistently elevated INR.  I have instructed the patient to continue to hold warfarin today, Saturday and Sunday, with plan to hopefully resume on Monday.  Patient has a history of mechanical mitral valve and permanent A-fib.  [facility] health nurse has been instructed to call our warfarin clinic on Monday for further dosing instructions.

## 2024-02-20 ENCOUNTER — Ambulatory Visit: Payer: Self-pay | Admitting: Pharmacist

## 2024-02-20 DIAGNOSIS — Z7901 Long term (current) use of anticoagulants: Secondary | ICD-10-CM

## 2024-02-20 DIAGNOSIS — I824Y9 Acute embolism and thrombosis of unspecified deep veins of unspecified proximal lower extremity: Secondary | ICD-10-CM

## 2024-02-20 DIAGNOSIS — I4821 Permanent atrial fibrillation: Secondary | ICD-10-CM

## 2024-02-20 LAB — POCT INR: INR: 2.3 (ref 2.0–3.0)

## 2024-02-20 NOTE — Patient Instructions (Signed)
 Description   INR 2.3, Spoke with [facility] Restart taking warfarin 1/2 TABLET DAILY.   Continue leafy vegetables in your diet & stay consistent.  Recheck INR in 1 weeks  Please call with any questions. Coumadin  Clinic [phone]

## 2024-02-20 NOTE — Telephone Encounter (Signed)
 Addressed by coumadin  clinic today

## 2024-02-20 NOTE — Progress Notes (Signed)
 Description   INR 2.3, Spoke with [facility] Restart taking warfarin 1/2 TABLET DAILY.   Continue leafy vegetables in your diet & stay consistent.  Recheck INR in 1 weeks  Please call with any questions. Coumadin  Clinic [phone]

## 2024-02-23 ENCOUNTER — Telehealth: Payer: Self-pay | Admitting: Emergency Medicine

## 2024-02-23 NOTE — Telephone Encounter (Signed)
 Faxed signed orders as requested by [facility]

## 2024-02-27 LAB — POCT INR: INR: 2.2 (ref 2.0–3.0)

## 2024-02-28 ENCOUNTER — Telehealth: Payer: Self-pay | Admitting: *Deleted

## 2024-02-28 ENCOUNTER — Ambulatory Visit (INDEPENDENT_AMBULATORY_CARE_PROVIDER_SITE_OTHER): Admitting: *Deleted

## 2024-02-28 DIAGNOSIS — I4821 Permanent atrial fibrillation: Secondary | ICD-10-CM

## 2024-02-28 DIAGNOSIS — Z5181 Encounter for therapeutic drug level monitoring: Secondary | ICD-10-CM

## 2024-02-28 DIAGNOSIS — I824Y9 Acute embolism and thrombosis of unspecified deep veins of unspecified proximal lower extremity: Secondary | ICD-10-CM

## 2024-02-28 DIAGNOSIS — Z7901 Long term (current) use of anticoagulants: Secondary | ICD-10-CM

## 2024-02-28 NOTE — Patient Instructions (Addendum)
 Description   INR 2.2, Spoke with Ilah Kipper LPN, [facility] and to have patient take 1 tablet of warfarin today then START taking warfarin 1/2 TABLET DAILY EXCEPT 1 TABLET ON MONDAYS.  Continue leafy vegetables in your diet & stay consistent.  Recheck INR in 1 week-CALL ONCE INR IS DONE.  Please call with any questions. Coumadin  Clinic [phone]

## 2024-02-28 NOTE — Telephone Encounter (Signed)
 Left a generic message fro [facility] to call back regarding mutual patient.  Need to inquire if labs were drawn on yesterday

## 2024-02-28 NOTE — Progress Notes (Signed)
 Description   INR 2.2, Spoke with Ilah Kipper LPN, [facility] and to have patient take 1 tablet of warfarin today then START taking warfarin 1/2 TABLET DAILY EXCEPT 1 TABLET ON MONDAYS.  Continue leafy vegetables in your diet & stay consistent.  Recheck INR in 1 week-CALL ONCE INR IS DONE.  Please call with any questions. Coumadin  Clinic [phone]

## 2024-03-02 ENCOUNTER — Ambulatory Visit: Admitting: Gastroenterology

## 2024-03-02 ENCOUNTER — Encounter: Payer: Self-pay | Admitting: Gastroenterology

## 2024-03-02 VITALS — BP 100/72 | HR 81 | Ht 61.0 in | Wt 136.5 lb

## 2024-03-02 DIAGNOSIS — K7460 Unspecified cirrhosis of liver: Secondary | ICD-10-CM

## 2024-03-02 DIAGNOSIS — K7682 Hepatic encephalopathy: Secondary | ICD-10-CM

## 2024-03-02 MED ORDER — RIFAXIMIN 550 MG PO TABS: 550.0000 mg | ORAL_TABLET | Freq: Two times a day (BID) | ORAL | 3 refills | Status: AC

## 2024-03-02 NOTE — Progress Notes (Signed)
 03/02/2024 Chelsea Boyer [id number] 1945/12/24   Discussed the use of AI scribe software for clinical note transcription with the patient, who gave verbal consent to proceed.  History of Present Illness Chelsea Boyer is a 78 year old female with cirrhosis and heart failure who presents for follow-up of ascites management.  She has cirrhosis due to NASH.  Patient of Dr. Clayburn.  She is here today with her daughter, Chelsea Boyer.  She was last seen in the office in May and had a hospital admission in October where she underwent two paracentesis procedures to remove fluid, first one with 10 Liters removed. During her hospital stay, the team discussed that her ascites seemed be related to heart failure, based on the fluid studies performed.  She experiences fluid accumulation in her legs and abdomen but does not feel significantly uncomfortable or short of breath at this time. Her weight has been fluctuating, with a notable increase to 146 pounds in October before her second paracentesis, and currently stabilizing around 139-140 pounds for the past few weeks according to her daughter. She monitors her weight daily.  Her current medication regimen includes torsemide  40 mg and spironolactone  25 mg, managed by her cardiology and nephrology teams due to concerns about her kidney function and low BP. She is also on a 2-gram sodium diet to help manage fluid retention.  She takes rifaximin  twice daily and lactulose  in the morning, aiming for two to three soft bowel movements per day, and reports achieving at least two bowel movements daily. She is also on midodrine  to support her blood pressure, which is currently 100/72 mmHg.  No blood in the stool or black, tarry stools. No significant shortness of breath or feeling of fullness despite the fluid accumulation.   Past Medical History:  Diagnosis Date   Atrial fibrillation (HCC)    Cirrhosis (HCC)    Heart failure (HCC)    Hepatic  cirrhosis (HCC)    Hypothyroidism    Past Surgical History:  Procedure Laterality Date   ANKLE SURGERY  1998   AORTIC VALVE REPLACEMENT  2018   CESAREAN SECTION     IR PARACENTESIS  12/30/2023   MITRAL VALVE REPLACEMENT     x 2   RIGHT HEART CATH N/A 01/02/2024   Procedure: RIGHT HEART CATH;  Surgeon: Zenaida Morene PARAS, MD;  Location: [facility] INVASIVE CV LAB;  Service: Cardiovascular;  Laterality: N/A;   TOTAL HIP ARTHROPLASTY Left 2014    reports that she has never smoked. She has never used smokeless tobacco. She reports current alcohol use. She reports that she does not currently use drugs. family history includes Breast cancer in her maternal grandmother; Diabetes in her brother; Emphysema in her brother; Heart attack in her brother; Heart disease in her brother; Heart disease (age of onset: 57) in her mother; Rheumatic fever in her mother. Allergies[1]    Outpatient Encounter Medications as of 03/02/2024  Medication Sig   alendronate (FOSAMAX) 70 MG tablet Take 70 mg by mouth once a week.   Cholecalciferol (VITAMIN D3) 50 MCG (2000 UT) TABS Take 6,000 Units by mouth daily.   fluticasone  (FLONASE ) 50 MCG/ACT nasal spray Place 1 spray into both nostrils daily.   lactulose  (CHRONULAC ) 10 GM/15ML solution Take 30 mLs (20 g total) by mouth 2 (two) times daily.   levothyroxine  (SYNTHROID ) 125 MCG tablet Take 125 mcg by mouth every morning.   metoprolol  succinate (TOPROL -XL) 25 MG 24 hr tablet Take 1 tablet (25  mg total) by mouth daily.   midodrine  (PROAMATINE ) 5 MG tablet Take 1 tablet (5 mg total) by mouth 3 (three) times daily with meals.   rifaximin  (XIFAXAN ) 550 MG TABS tablet Take 1 tablet (550 mg total) by mouth 2 (two) times daily.   spironolactone  (ALDACTONE ) 25 MG tablet Take 1 tablet (25 mg total) by mouth daily.   torsemide  40 MG TABS Take 40 mg by mouth daily.   warfarin (COUMADIN ) 5 MG tablet TAKE 1/2 TABLET (2.5mg ) BY MOUTH DAILY AS DIRECTED BY COUMADIN  CLINIC    [DISCONTINUED] predniSONE  (DELTASONE ) 20 MG tablet Take 1-2 tablets (20-40 mg total) by mouth daily with breakfast. Take 40mg  for 1day then 20mg  for 2days then STOP   No facility-administered encounter medications on file as of 03/02/2024.     REVIEW OF SYSTEMS  : All other systems reviewed and negative except where noted in the History of Present Illness.   PHYSICAL EXAM: BP 100/72   Pulse 81   Ht 5' 1 (1.549 m)   Wt 136 lb 8 oz (61.9 kg)   BMI 25.79 kg/m  General: Well developed white female in no acute distress Head: Normocephalic and atraumatic Eyes:  Sclerae anicteric, conjunctiva pink. Ears: Normal auditory acuity Lungs: Clear throughout to auscultation Heart: Regular rate and rhythm Abdomen: Softly distended with ascites fluid.  Protruding ventral hernia noted, but reducible and non-tender. Musculoskeletal: Symmetrical with no gross deformities  Skin: No lesions on visible extremities Extremities: LE edema noted. Neurological: Alert oriented x 4, grossly non-focal Psychological:  Alert and cooperative. 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 Ascites management deferred to cardiology and nephrology due to renal concerns and low BP requiring midodrine . Current weight is stable between 139-140 lbs for a couple of weeks per her daughter. No shortness of breath. Hepatic encephalopathy managed with rifaximin  and lactulose . No gastrointestinal bleeding reported. - Continue torsemide  40 mg and spironolactone  25 mg as managed by cardiology and nephrology. - Maintain a 2 gram sodium diet. - Monitor weight daily and report any increase of 5 pounds or more within a week. - Continue rifaximin  twice daily and lactulose  to achieve 2-3 soft bowel movements per day.  Xifaxan  prescription sent to pharmacy. - Contact healthcare team if  weight increases significantly or if abdominal distension or shortness of breath worsens to be scheduled for paracentesis. - They will coordinate with cardiology and nephrology for fluid management and medication adjustments. -  Follow-up here in 6 months or sooner if needed.   CC:  Chrystal Lamarr RAMAN, MD       [1] No Known Allergies

## 2024-03-02 NOTE — Patient Instructions (Signed)
 We have sent the following medications to your pharmacy for you to pick up at your convenience: Xifaxan  550 mg twice daily.   Continue 2 gram sodium diet.   Call or send Mychart message as needed for paracentesis.   _______________________________________________________  If your blood pressure at your visit was 140/90 or greater, please contact your primary care physician to follow up on this.  _______________________________________________________  If you are age 79 or older, your body mass index should be between 23-30. Your Body mass index is 25.79 kg/m. If this is out of the aforementioned range listed, please consider follow up with your Primary Care Provider.  If you are age 79 or younger, your body mass index should be between 19-25. Your Body mass index is 25.79 kg/m. If this is out of the aformentioned range listed, please consider follow up with your Primary Care Provider.   ________________________________________________________  The [facility] providers would like to encourage you to use MYCHART to communicate with providers for non-urgent requests or questions.  Due to long hold times on the telephone, sending your provider a message by [facility] may be a faster and more efficient way to get a response.  Please allow 48 business hours for a response.  Please remember that this is for non-urgent requests.  _______________________________________________________  [facility] is using a team-based approach to care.  Your team is made up of your doctor and two to three APPS. Our APPS (Nurse Practitioners and Physician Assistants) work with your physician to ensure care continuity for you. They are fully qualified to address your health concerns and develop a treatment plan. They communicate directly with your gastroenterologist to care for you. Seeing the Advanced Practice Practitioners on your physician's team can help you by facilitating care more promptly, often  allowing for earlier appointments, access to diagnostic testing, procedures, and other specialty referrals.

## 2024-03-05 LAB — POCT INR: INR: 2.7 (ref 2.0–3.0)

## 2024-03-06 ENCOUNTER — Ambulatory Visit (INDEPENDENT_AMBULATORY_CARE_PROVIDER_SITE_OTHER): Admitting: *Deleted

## 2024-03-06 DIAGNOSIS — I824Y9 Acute embolism and thrombosis of unspecified deep veins of unspecified proximal lower extremity: Secondary | ICD-10-CM

## 2024-03-06 DIAGNOSIS — I4821 Permanent atrial fibrillation: Secondary | ICD-10-CM

## 2024-03-06 DIAGNOSIS — Z7901 Long term (current) use of anticoagulants: Secondary | ICD-10-CM

## 2024-03-06 NOTE — Patient Instructions (Signed)
 Description   INR 2.7, Spoke pt's husband Lynwood advised to have pt continue on same dosage of Warfarin 1/2 TABLET DAILY EXCEPT 1 TABLET ON SUNDAYS. Called Leia Kipper LPN, [facility] and gave verbal order to recheck in 1 week on 03/12/24 as well as current dosage instructions.  Continue leafy vegetables in your diet & stay consistent.  Recheck INR in 1 week-CALL ONCE INR IS DONE.  Please call with any questions. Coumadin  Clinic [phone]

## 2024-03-06 NOTE — Progress Notes (Signed)
 Description   INR 2.7, Spoke pt's husband Lynwood advised to have pt continue on same dosage of Warfarin 1/2 TABLET DAILY EXCEPT 1 TABLET ON SUNDAYS. Called Leia Kipper LPN, [facility] and gave verbal order to recheck in 1 week on 03/12/24 as well as current dosage instructions.  Continue leafy vegetables in your diet & stay consistent.  Recheck INR in 1 week-CALL ONCE INR IS DONE.  Please call with any questions. Coumadin  Clinic [phone]

## 2024-03-07 NOTE — Progress Notes (Signed)
 ____________________________________________________________  Attending physician addendum:  Thank you for sending this case to me. I have reviewed the entire note and agree with the plan.  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  I agree that the fluid management is best handled by her cardiology and nephrology teams.  Victory Brand, MD  ____________________________________________________________

## 2024-03-12 ENCOUNTER — Ambulatory Visit: Payer: Self-pay | Admitting: *Deleted

## 2024-03-12 DIAGNOSIS — I824Y9 Acute embolism and thrombosis of unspecified deep veins of unspecified proximal lower extremity: Secondary | ICD-10-CM

## 2024-03-12 DIAGNOSIS — I4821 Permanent atrial fibrillation: Secondary | ICD-10-CM

## 2024-03-12 DIAGNOSIS — Z7901 Long term (current) use of anticoagulants: Secondary | ICD-10-CM

## 2024-03-12 LAB — POCT INR: INR: 2.5 (ref 2.0–3.0)

## 2024-03-12 NOTE — Patient Instructions (Signed)
 Description   INR 2.5, Spoke Fidela, RN, [facility] and pt's husband Lynwood advised to have pt continue taking Warfarin 1/2 TABLET DAILY EXCEPT 1 TABLET ON SUNDAYS. Continue leafy vegetables in your diet & stay consistent.  Recheck INR in 1 week-CALL ONCE INR IS DONE.  Please call with any questions. Coumadin  Clinic [phone]

## 2024-03-12 NOTE — Progress Notes (Signed)
 Description   INR 2.5, Spoke Fidela, RN, [facility] and pt's husband Lynwood advised to have pt continue taking Warfarin 1/2 TABLET DAILY EXCEPT 1 TABLET ON SUNDAYS. Continue leafy vegetables in your diet & stay consistent.  Recheck INR in 1 week-CALL ONCE INR IS DONE.  Please call with any questions. Coumadin  Clinic [phone]

## 2024-03-19 ENCOUNTER — Ambulatory Visit (INDEPENDENT_AMBULATORY_CARE_PROVIDER_SITE_OTHER): Admitting: Cardiovascular Disease

## 2024-03-19 DIAGNOSIS — Z954 Presence of other heart-valve replacement: Secondary | ICD-10-CM

## 2024-03-19 DIAGNOSIS — Z5181 Encounter for therapeutic drug level monitoring: Secondary | ICD-10-CM

## 2024-03-19 LAB — POCT INR: INR: 2.4 (ref 2.0–3.0)

## 2024-03-19 NOTE — Progress Notes (Addendum)
 Lab Results  Component Value Date   INR 2.4 03/19/2024   INR 2.5 03/12/2024   INR 2.7 03/05/2024    Description   INR 2.5, Beatris Genre, RN, [facility] while she was at pt's home and instructed pt to take 1 tablet of warfarin today and then continue taking Warfarin 1/2 TABLET DAILY EXCEPT 1 TABLET ON SUNDAYS. Continue leafy vegetables in your diet & stay consistent.  Recheck INR in 1 week-CALL ONCE INR IS DONE.  Please call with any questions. Coumadin  Clinic [phone]

## 2024-03-19 NOTE — Patient Instructions (Addendum)
 Description   INR 2.5, Beatris Genre, RN, [facility] while she was at pt's home and instructed pt to take 1 tablet of warfarin today and then continue taking Warfarin 1/2 TABLET DAILY EXCEPT 1 TABLET ON SUNDAYS. Continue leafy vegetables in your diet & stay consistent.  Recheck INR in 1 week-CALL ONCE INR IS DONE.  Please call with any questions. Coumadin  Clinic [phone]

## 2024-03-20 ENCOUNTER — Other Ambulatory Visit: Payer: Self-pay | Admitting: *Deleted

## 2024-03-20 DIAGNOSIS — K7460 Unspecified cirrhosis of liver: Secondary | ICD-10-CM

## 2024-03-20 DIAGNOSIS — R188 Other ascites: Secondary | ICD-10-CM

## 2024-03-20 MED ORDER — LACTULOSE 10 GM/15ML PO SOLN: 20.0000 g | Freq: Two times a day (BID) | ORAL | 3 refills | Status: AC

## 2024-03-20 NOTE — Telephone Encounter (Signed)
 Please advise

## 2024-03-21 ENCOUNTER — Ambulatory Visit (HOSPITAL_COMMUNITY)

## 2024-03-26 ENCOUNTER — Telehealth: Payer: Self-pay | Admitting: *Deleted

## 2024-03-26 ENCOUNTER — Ambulatory Visit (HOSPITAL_COMMUNITY)
Admission: RE | Admit: 2024-03-26 | Discharge: 2024-03-26 | Disposition: A | Discharge status: Home / Self Care | Source: Ambulatory Visit | Attending: Gastroenterology | Admitting: Gastroenterology

## 2024-03-26 DIAGNOSIS — R188 Other ascites: Secondary | ICD-10-CM | POA: Diagnosis not present

## 2024-03-26 DIAGNOSIS — K7460 Unspecified cirrhosis of liver: Secondary | ICD-10-CM | POA: Diagnosis present

## 2024-03-26 DIAGNOSIS — K7581 Nonalcoholic steatohepatitis (NASH): Secondary | ICD-10-CM | POA: Insufficient documentation

## 2024-03-26 HISTORY — PX: IR PARACENTESIS: IMG2679

## 2024-03-26 LAB — BODY FLUID CELL COUNT WITH DIFFERENTIAL
Eos, Fluid: 0 %
Lymphs, Fluid: 36 %
Monocyte-Macrophage-Serous Fluid: 59 % (ref 50–90)
Neutrophil Count, Fluid: 5 % (ref 0–25)
Total Nucleated Cell Count, Fluid: 214 uL (ref 0–1000)

## 2024-03-26 MED ORDER — LIDOCAINE-EPINEPHRINE 1 %-1:100000 IJ SOLN
INTRAMUSCULAR | Status: AC
  Filled 2024-03-26: qty 20

## 2024-03-26 MED ORDER — LIDOCAINE-EPINEPHRINE 1 %-1:100000 IJ SOLN
20.0000 mL | Freq: Once | INTRAMUSCULAR | Status: AC
  Administered 2024-03-26: 10 mL via INTRADERMAL

## 2024-03-26 NOTE — Telephone Encounter (Signed)
 Called Chelsea Boyer with [facility] and was advised that the patient had another visit out today and the family canceled the Home Health Appt. She states she will be going tomorrow to obtain then INR tomorrow and she will call us  tomorrow.

## 2024-03-26 NOTE — Procedures (Signed)
 PROCEDURE SUMMARY:  Successful image-guided paracentesis from the right lower abdomen.  Yielded 5 liters of amber fluid.  No immediate complications.  EBL: zero Patient tolerated well.   Specimen sent for labs.  Please see imaging section of Epic for full dictation.  Chelsea Boyer B Yesenia Fontenette NP 03/26/2024 11:12 AM

## 2024-03-27 ENCOUNTER — Ambulatory Visit: Payer: Self-pay

## 2024-03-27 DIAGNOSIS — I824Y9 Acute embolism and thrombosis of unspecified deep veins of unspecified proximal lower extremity: Secondary | ICD-10-CM

## 2024-03-27 DIAGNOSIS — I4821 Permanent atrial fibrillation: Secondary | ICD-10-CM

## 2024-03-27 DIAGNOSIS — Z7901 Long term (current) use of anticoagulants: Secondary | ICD-10-CM

## 2024-03-27 LAB — POCT INR: INR: 2.8 (ref 2.0–3.0)

## 2024-03-27 LAB — PATHOLOGIST SMEAR REVIEW

## 2024-03-27 NOTE — Patient Instructions (Signed)
 Description   INR 2.8, Spoke Fidela, RN, [facility] while she was at pt's home and instructed pt to continue taking Warfarin 1/2 TABLET DAILY EXCEPT 1 TABLET ON SUNDAYS. Continue leafy vegetables in your diet & stay consistent.  Recheck INR in 1 week-CALL ONCE INR IS DONE.  Please call with any questions. Coumadin  Clinic [phone]

## 2024-03-27 NOTE — Progress Notes (Signed)
 Description   INR 2.8, Spoke Fidela, RN, [facility] while she was at pt's home and instructed pt to continue taking Warfarin 1/2 TABLET DAILY EXCEPT 1 TABLET ON SUNDAYS. Continue leafy vegetables in your diet & stay consistent.  Recheck INR in 1 week-CALL ONCE INR IS DONE.  Please call with any questions. Coumadin  Clinic [phone]

## 2024-03-29 LAB — BODY FLUID CULTURE W GRAM STAIN
Culture: NO GROWTH
Gram Stain: NONE SEEN

## 2024-03-30 ENCOUNTER — Encounter: Payer: Self-pay | Admitting: Cardiovascular Disease

## 2024-03-30 ENCOUNTER — Ambulatory Visit: Attending: Cardiovascular Disease | Admitting: Cardiovascular Disease

## 2024-03-30 VITALS — BP 112/80 | HR 90 | Ht 61.0 in | Wt 143.2 lb

## 2024-03-30 DIAGNOSIS — I4821 Permanent atrial fibrillation: Secondary | ICD-10-CM | POA: Diagnosis present

## 2024-03-30 DIAGNOSIS — K7460 Unspecified cirrhosis of liver: Secondary | ICD-10-CM | POA: Insufficient documentation

## 2024-03-30 DIAGNOSIS — I361 Nonrheumatic tricuspid (valve) insufficiency: Secondary | ICD-10-CM | POA: Diagnosis not present

## 2024-03-30 DIAGNOSIS — R188 Other ascites: Secondary | ICD-10-CM | POA: Insufficient documentation

## 2024-03-30 DIAGNOSIS — I350 Nonrheumatic aortic (valve) stenosis: Secondary | ICD-10-CM | POA: Diagnosis present

## 2024-03-30 DIAGNOSIS — Z7901 Long term (current) use of anticoagulants: Secondary | ICD-10-CM | POA: Insufficient documentation

## 2024-03-30 DIAGNOSIS — I50812 Chronic right heart failure: Secondary | ICD-10-CM | POA: Insufficient documentation

## 2024-03-30 DIAGNOSIS — I9589 Other hypotension: Secondary | ICD-10-CM | POA: Diagnosis present

## 2024-03-30 DIAGNOSIS — I2721 Secondary pulmonary arterial hypertension: Secondary | ICD-10-CM | POA: Diagnosis not present

## 2024-03-30 DIAGNOSIS — Z952 Presence of prosthetic heart valve: Secondary | ICD-10-CM | POA: Diagnosis not present

## 2024-03-30 MED ORDER — TORSEMIDE 20 MG PO TABS: 40.0000 mg | ORAL_TABLET | Freq: Every day | ORAL | 3 refills | Status: DC

## 2024-03-30 NOTE — Patient Instructions (Addendum)
 Medication Instructions:    Continue taking torsemide   40 mg daily if you weight is above or equal to 140 lbs then take 60 mg torsemide ( 3 tablet)s  that day,   Weight yourself daily.  *If you need a refill on your cardiac medications before your next appointment, please call your pharmacy*   Lab Work: Not needed    Testing/Procedures: Not needed   Follow-Up: At [facility], you and your health needs are our priority.  As part of our continuing mission to provide you with exceptional heart care, we have created designated Provider Care Teams.  These Care Teams include your primary Cardiologist (physician) and Advanced Practice Providers (APPs -  Physician Assistants and Nurse Practitioners) who all work together to provide you with the care you need, when you need it.     Your next appointment:   6 month(s)  The format for your next appointment:   In Person  Provider:   Jerel Balding, MD   Other Instructions

## 2024-03-30 NOTE — Progress Notes (Unsigned)
 " Cardiology Office Note:    Date:  03/31/2024   ID:  Chelsea Boyer, DOB 10-Sep-1945, MRN [id number]  PCP:  Chrystal Lamarr RAMAN, MD   [facility]  Cardiologist:  Jerel Balding, MD  Advanced Practice Provider:  No care team member to display Electrophysiologist:  None       Referring MD: Chrystal Lamarr RAMAN, MD   Chief Complaint  Patient presents with   Cardiac Valve Problem   Congestive Heart Failure     History of Present Illness:    Chelsea Boyer is a 79 y.o. 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  Additional medical problems include well treated hypertension and hypothyroidism.  She is accompanied by her cousin who helps with review of systems.  She recently underwent a 5 L paracentesis after which her weight went down to 133.8 pounds.  It has been 3 months since the previous paracentesis when she had 10 L removed.  She has been weighing every day at home and her weight on her scone scale has steadily gone up and is recently up to 241 pounds.  She is taking torsemide  40 mg daily and spironolactone  25 mg daily.  She has relatively mild symptoms of encephalopathy, mostly short-term memory issues and some difficulty sleeping.  She is on lactulose  and rifaximin .  She has a prescription available for midodrine  for hypotension but only requires that every now and then.  Her husband only administers it if her blood pressure first thing in the morning is too low.  She has not had the dizziness or falls.  She denies chest pain and is not aware of any palpitations.  She has become  very sedentary so dyspnea has not been a big complaint.  She does not have orthopnea or PND.  She has chronic moderate to severe lower extremity edema to the knees despite wearing compression stockings.  She does not have any weeping wounds or blisters  Right heart catheterization performed 01/02/2024 showed mean RA pressure 16, PA pressure 80/31 (mean 47 mmHg), PAWP 20 mmHg PVR 5 Wood units, transpulmonary gradient 27 mm Hg, PAPi 3.06.  Echo 12/30/2023 shows normal LVEF 55 to 60% with reduced RV function demented systolic PAP 77 mmHg, severe biatrial dilation mean gradient across the mitral valve 8 mmHg (heart rate 82), mean aortic valve gradient 33 mmHg with dimensionless valve index 0.21, plethoric inferior vena cava.  She has significant hyponatremia that seems to be stable around 122-124.  She has not had any problems with potassium abnormalities.  Her creatinine has been also mildly abnormal in the 1.2-1.3 range.  The transaminases are normal.  Her albumin  is typically in the 2.2-2.4 range, and her bilirubin is minimally elevated at 1.5-1.9.  She continues to take warfarin for her mechanical prosthesis and her INR has been quite stable 2.2-3.0 in the last several months (has not had supratherapeutic INR since hospitalization in mid October).  Sees Dr. Legrand in the gastroenterology clinic and Dr. Gearline at [facility].    She was initially diagnosed with mitral valve disease and underwent mitral valve replacement with a biological prosthesis in 1986 (at that point she was still establishing her own  family).  She subsequent underwent replacement of the biological prosthesis with a mechanical valve (Saint Jude 25 mm) in 1996.  During follow-up she developed aortic valve stenosis and underwent TAVR with a Edwards SAPIEN valve (23 mm, [id number]) on August 21. 2018.  She has a longstanding history of atrial fibrillation and after failing 3 previous attempts at cardioversion is being managed with rate  control for multiple years.  Review of echocardiogram shows that she initially had mild-moderate tricuspid insufficiency as recently as 2013 that progressively worsened.  Since 2015 her tricuspid valve regurgitation has been described as being at least moderate to severe.    Before her TAVR procedure she was diagnosed with a non-STEMI, but as far as I understand, coronary angiography did not show any evidence of significant coronary stenoses (report not available).  Following her TAVR procedure in 2018 she developed ascites and severe lower extremity edema and was diagnosed with cirrhosis, but did not have esophageal varices.  This has been managed successfully with diuretics, until October 2025 when she first needed paracentesis. She had a more extensive evaluation for pulmonary hypertension including a normal VQ scan and overnight oximetry and high resolution CT scan of the chest studies to exclude pulmonary embolism and obstructive sleep apnea and interstitial lung disease, respectively.     Past Medical History:  Diagnosis Date   Atrial fibrillation (HCC)    Cirrhosis (HCC)    Heart failure (HCC)    Hepatic cirrhosis (HCC)    Hypothyroidism     Past Surgical History:  Procedure Laterality Date   ANKLE SURGERY  1998   AORTIC VALVE REPLACEMENT  2018   CESAREAN SECTION     IR PARACENTESIS  12/30/2023   IR PARACENTESIS  03/26/2024   MITRAL VALVE REPLACEMENT     x 2   RIGHT HEART CATH N/A 01/02/2024   Procedure: RIGHT HEART CATH;  Surgeon: Zenaida Morene PARAS, MD;  Location: [facility] INVASIVE CV LAB;  Service: Cardiovascular;  Laterality: N/A;   TOTAL HIP ARTHROPLASTY Left 2014    Current Medications: Current Meds  Medication Sig   alendronate (FOSAMAX) 70 MG tablet Take 70 mg by mouth once a week.   Cholecalciferol (VITAMIN D3) 50 MCG (2000 UT) TABS Take 6,000 Units by mouth daily.   fluticasone  (FLONASE ) 50 MCG/ACT nasal spray Place 1 spray into both nostrils daily.   lactulose   (CHRONULAC ) 10 GM/15ML solution Take 30 mLs (20 g total) by mouth 2 (two) times daily.   levothyroxine  (SYNTHROID ) 125 MCG tablet Take 125 mcg by mouth every morning.   metoprolol  succinate (TOPROL -XL) 25 MG 24 hr tablet Take 1 tablet (25 mg total) by mouth daily.   midodrine  (PROAMATINE ) 5 MG tablet Take 1 tablet (5 mg total) by mouth 3 (three) times daily with meals.   rifaximin  (XIFAXAN ) 550 MG TABS tablet Take 1 tablet (550 mg total) by mouth 2 (two) times daily.   spironolactone  (ALDACTONE ) 25 MG tablet Take 1 tablet (25 mg total) by mouth daily.   warfarin (COUMADIN ) 5 MG tablet TAKE 1/2 TABLET (2.5mg ) BY MOUTH DAILY AS DIRECTED BY COUMADIN  CLINIC   [DISCONTINUED] torsemide  (DEMADEX ) 20 MG tablet Take 40 mg by mouth daily.     Allergies:   Patient has no known allergies.       Family History: The patient's family history includes Breast cancer in her maternal grandmother; Diabetes in her brother; Emphysema in her brother; Heart attack in her brother; Heart disease in her brother; Heart disease (age of onset:  36) in her mother; Rheumatic fever in her mother.  ROS:   Please see the history of present illness.     All other systems reviewed and are negative.  EKGs/Labs/Other Studies Reviewed:    The following studies were reviewed today:  12/30/2023 echocardiogram  1. Left ventricular ejection fraction, by estimation, is 55 to 60%. The  left ventricle has normal function. The left ventricle has no regional  wall motion abnormalities. There is mild left ventricular hypertrophy.  Left ventricular diastolic function  could not be evaluated.   2. Right ventricular systolic function is moderately reduced. The right  ventricular size is severely enlarged. There is severely elevated  pulmonary artery systolic pressure. The estimated right ventricular  systolic pressure is 76.8 mmHg.   3. Left atrial size was severely dilated.   4. Right atrial size was severely dilated.   5. The  mitral valve has been repaired/replaced. No evidence of mitral  valve regurgitation. The mean mitral valve gradient is 7.7 mmHg. There is  a 25 mm St. Jude mechanical valve present in the mitral position.   6. Tricuspid valve regurgitation is severe.   7. The aortic valve has been repaired/replaced. There is moderate  calcification of the aortic valve. There is moderate thickening of the  aortic valve. Aortic valve regurgitation is mild. Moderate to severe  aortic valve stenosis. There is a 23 mm Sapien  prosthetic (TAVR) valve present in the aortic position.   8. The inferior vena cava is dilated in size with <50% respiratory  variability, suggesting right atrial pressure of 15 mmHg.   Comparison(s): Changes from prior study are noted. Aortic valve gradient  has ranged from 15-20 mmHg since at least 2019 based on reports, with  increase to 23.5 mmHg last year and now 33 mmHg. Visually TAVR leaflets  are calcified and restricted. Stroke  volume is low, which likely underestimates gradients. Suspect based on AVA  and DI that TAVR has moderate to severe stenosis.   The most recent nuclear perfusion study is dated June 14, 2012 and shows a an apical attenuation defect, no ischemia seen, EF 55%    EKG: Personally reviewed the most recent electrocardiogram from 12/30/2023 which shows atrial fibrillation with controlled ventricular response, LBBB with right superior axis deviation 12/29/2023: B Natriuretic Peptide 639.2; TSH 19.514 01/11/2024: Hemoglobin 9.2; Magnesium  1.9; Platelets 156 01/15/2024: ALT 12 01/16/2024: BUN 28; Creatinine, Ser 1.31; Potassium 4.3; Sodium 122    Recent Lipid Panel No results found for: CHOL, TRIG, HDL, CHOLHDL, VLDL, LDLCALC, LDLDIRECT 11/19/2022 Cholesterol 128, HDL 38, LDL 70, triglycerides 68  Risk Assessment/Calculations:    CHA2DS2-VASc Score = 8 Not appropriate (mechanical heart valve) This indicates a 10.8% annual risk of  stroke. The patient's score is based upon: CHF History: 1 HTN History: 1 Diabetes History: 0 Stroke History: 2 (remote infarct noted on head CT) Vascular Disease History: 1 (aortic atherosclerosis noted on CT) Age Score: 2 Gender Score: 1    Physical Exam:    VS:  BP 112/80   Pulse 90   Ht 5' 1 (1.549 m)   Wt 143 lb 3.2 oz (65 kg)   SpO2 99%   BMI 27.06 kg/m     Wt Readings from Last 3 Encounters:  03/30/24 143 lb 3.2 oz (65 kg)  03/02/24 136 lb 8 oz (61.9 kg)  01/16/24 122 lb 12.8 oz (55.7 kg)      General: Alert, oriented x3, no distress, slightly pale, not jaundiced Head: no evidence  of trauma, PERRL, EOMI, no exophtalmos or lid lag, no myxedema, no xanthelasma; normal ears, nose and oropharynx Neck: Elevation in jugular venous pulsations and prominent V waves to the angle of the jaw; brisk carotid pulses without delay and no carotid bruits Chest: clear to auscultation, no signs of consolidation by percussion or palpation, normal fremitus, symmetrical and full respiratory excursions.  Spider angiomata are seen Cardiovascular: RV heave is present, irregular rhythm, normal first and paradoxically split second heart sounds, 3/6 holosystolic murmur heard broadly across the entire precordium, no diastolic murmurs, rubs or gallops Abdomen: Mild ascites, nontense Extremities: 3+ edema to just above the knees bilaterally Neurological: grossly nonfocal.  No asterixis is present.  Some evidence of short-term memory issues but otherwise appears alert and oriented Psych: Normal mood and affect     ASSESSMENT:    1. Chronic right-sided heart failure (HCC)   2. PAH (pulmonary artery hypertension) (HCC)   3. Nonrheumatic tricuspid valve regurgitation   4. H/O mitral valve replacement with mechanical valve   5. H/O aortic valve replacement   6. Severe aortic stenosis   7. Permanent atrial fibrillation (HCC)   8. Other specified hypotension   9. Cirrhosis of liver with ascites,  unspecified hepatic cirrhosis type (HCC)   10. Chronic anticoagulation       PLAN:    In order of problems listed above:  Right heart failure/PAH: She has prominent lower extremity edema and ascites is present.  She is reaccumulating fluid albeit slowly.  Asked to continue daily weight monitoring and to adjust the torsemide  dose based on her weight.  If her weight is over 140 pounds they should increase the dose to 60 mg until her weight is again under 140.  Has severe RV dysfunction and tricuspid regurgitation due to severe pulmonary hypertension. Severe tricuspid regurgitation: It is hard to say whether there is rheumatic involvement of the tricuspid valve or this is simply due to sequelae from longstanding mitral valve disease.  She has had 2 previous sternotomies and is not a candidate for a third open heart procedure.  She is not a candidate for Triclip with severe PAH, RV dysfunction, high PVR Mechanical MVR: Gradients have increased slightly, possibly due to high cardiac output of worsening cirrhosis.  Otherwise valve function is normal   aware of the need for endocarditis prophylaxis. TAVR: Worsening gradients are not only due to high cardiac output, but also due to deterioration of the prosthetic valve.  She is not a candidate for redo TAVR, unfortunately. Hx of NSTEMI: She denies angina pectoris.  I suspect that this was not due to conventional coronary disease.  Could have been cardioembolic or demand ischemia in the setting of severe aortic stenosis.  There is no mention of coronary disease in her records. Permanent atrial fibrillation: Adequate rate control.  She has severe biatrial dilation and severe TR.  Fully anticoagulated with warfarin. HTN: On a very low-dose of beta-blocker for ventricular rate control.  Occasionally needs midodrine  for blood pressure support, but this is not a frequent issue.  Discussed the appropriate use of midodrine , to be administered every 4-6 hours while  she is upright.  She should not lie flat for 4 hours after the medication the last dose of medication is preferably 6 hours before bedtime. Cirrhosis: Possible component of Hollie, but this is most likely primarily cardiac cirrhosis.  She has developed mild encephalopathy.  She has not had GI bleeding.  She has multiple metabolic markers of worsening hepatic function  including mild hyperbilirubinemia, moderately severe hypoalbuminemia. Hyponatremia is a poor prognostic sign. Anticoagulation: Monitor INR very carefully.  She has hepatic dysfunction, but unfortunately cannot stop the warfarin she has a mechanical mitral valve prosthesis with very high thromboembolic potential.  Prognosis is poor.  It is quite possible that we will be heading toward a palliative care approach in the next 12 months.     Medication Adjustments/Labs and Tests Ordered: Current medicines are reviewed at length with the patient today.  Concerns regarding medicines are outlined above.  No orders of the defined types were placed in this encounter.   Meds ordered this encounter  Medications   torsemide  (DEMADEX ) 20 MG tablet    Sig: Take 2 tablets (40 mg total) by mouth daily. May take an extra 20 mg dose if weight is 140 lb or greater in a day    Dispense:  80 tablet    Refill:  3     Patient Instructions  Medication Instructions:    Continue taking torsemide   40 mg daily if you weight is above or equal to 140 lbs then take 60 mg torsemide ( 3 tablet)s  that day,   Weight yourself daily.  *If you need a refill on your cardiac medications before your next appointment, please call your pharmacy*   Lab Work: Not needed    Testing/Procedures: Not needed   Follow-Up: At [facility], you and your health needs are our priority.  As part of our continuing mission to provide you with exceptional heart care, we have created designated Provider Care Teams.  These Care Teams include your primary Cardiologist  (physician) and Advanced Practice Providers (APPs -  Physician Assistants and Nurse Practitioners) who all work together to provide you with the care you need, when you need it.     Your next appointment:   6 month(s)  The format for your next appointment:   In Person  Provider:   Jerel Balding, MD   Other Instructions    Signed, Jerel Balding, MD  03/31/2024 12:46 PM    [facility]  "

## 2024-03-31 DIAGNOSIS — I2721 Secondary pulmonary arterial hypertension: Secondary | ICD-10-CM | POA: Insufficient documentation

## 2024-03-31 DIAGNOSIS — I350 Nonrheumatic aortic (valve) stenosis: Secondary | ICD-10-CM | POA: Insufficient documentation

## 2024-03-31 DIAGNOSIS — I959 Hypotension, unspecified: Secondary | ICD-10-CM | POA: Insufficient documentation

## 2024-03-31 DIAGNOSIS — Z952 Presence of prosthetic heart valve: Secondary | ICD-10-CM | POA: Insufficient documentation

## 2024-03-31 DIAGNOSIS — I361 Nonrheumatic tricuspid (valve) insufficiency: Secondary | ICD-10-CM | POA: Insufficient documentation

## 2024-04-01 ENCOUNTER — Encounter: Payer: Self-pay | Admitting: Cardiovascular Disease

## 2024-04-02 ENCOUNTER — Other Ambulatory Visit: Payer: Self-pay

## 2024-04-02 ENCOUNTER — Ambulatory Visit (INDEPENDENT_AMBULATORY_CARE_PROVIDER_SITE_OTHER)

## 2024-04-02 ENCOUNTER — Other Ambulatory Visit (HOSPITAL_COMMUNITY): Payer: Self-pay

## 2024-04-02 DIAGNOSIS — I4821 Permanent atrial fibrillation: Secondary | ICD-10-CM

## 2024-04-02 DIAGNOSIS — Z7901 Long term (current) use of anticoagulants: Secondary | ICD-10-CM

## 2024-04-02 DIAGNOSIS — I824Y9 Acute embolism and thrombosis of unspecified deep veins of unspecified proximal lower extremity: Secondary | ICD-10-CM

## 2024-04-02 LAB — POCT INR: INR: 2.4 (ref 2.0–3.0)

## 2024-04-02 MED ORDER — MIDODRINE HCL 5 MG PO TABS
5.0000 mg | ORAL_TABLET | Freq: Three times a day (TID) | ORAL | 3 refills | Status: AC
  Filled 2024-04-02: qty 270, 90d supply, fill #0

## 2024-04-02 NOTE — Telephone Encounter (Signed)
 Pt is requesting a refill on medication midodrine . This medication was prescribed in the hospital by another provider. Would Dr. Francyne like to refill this medication? Please address

## 2024-04-02 NOTE — Patient Instructions (Signed)
 Description   INR 2.4, Spoke Fidela, RN, [facility] while she was at pt's home and instructed pt to start taking Warfarin 1/2 TABLET DAILY EXCEPT 1 TABLET ON SUNDAYS AND THURSDAYS. Continue leafy vegetables in your diet & stay consistent.  Recheck INR in 1 week-CALL ONCE INR IS DONE.  Please call with any questions. Coumadin  Clinic [phone]

## 2024-04-02 NOTE — Telephone Encounter (Signed)
 Yes, please refill

## 2024-04-02 NOTE — Progress Notes (Signed)
 Description   INR 2.4, Spoke Fidela, RN, [facility] while she was at pt's home and instructed pt to start taking Warfarin 1/2 TABLET DAILY EXCEPT 1 TABLET ON SUNDAYS AND THURSDAYS. Continue leafy vegetables in your diet & stay consistent.  Recheck INR in 1 week-CALL ONCE INR IS DONE.  Please call with any questions. Coumadin  Clinic [phone]

## 2024-04-09 ENCOUNTER — Ambulatory Visit: Payer: Self-pay | Admitting: Pharmacist

## 2024-04-09 DIAGNOSIS — Z7901 Long term (current) use of anticoagulants: Secondary | ICD-10-CM

## 2024-04-09 DIAGNOSIS — I4821 Permanent atrial fibrillation: Secondary | ICD-10-CM | POA: Diagnosis not present

## 2024-04-09 DIAGNOSIS — I824Y9 Acute embolism and thrombosis of unspecified deep veins of unspecified proximal lower extremity: Secondary | ICD-10-CM | POA: Diagnosis not present

## 2024-04-09 LAB — POCT INR: INR: 2.7 (ref 2.0–3.0)

## 2024-04-09 NOTE — Progress Notes (Signed)
 Description   INR 2.7, Spoke Fidela, RN, [facility] while she was at pt's home and instructed pt to start taking Warfarin 1/2 TABLET DAILY EXCEPT 1 TABLET ON SUNDAYS AND THURSDAYS. Continue leafy vegetables in your diet & stay consistent.  Recheck INR in 1 week-CALL ONCE INR IS DONE.  Please call with any questions. Coumadin  Clinic [phone]

## 2024-04-09 NOTE — Patient Instructions (Signed)
 Description   INR 2.7, Spoke Fidela, RN, [facility] while she was at pt's home and instructed pt to start taking Warfarin 1/2 TABLET DAILY EXCEPT 1 TABLET ON SUNDAYS AND THURSDAYS. Continue leafy vegetables in your diet & stay consistent.  Recheck INR in 1 week-CALL ONCE INR IS DONE.  Please call with any questions. Coumadin  Clinic [phone]

## 2024-04-17 ENCOUNTER — Other Ambulatory Visit: Payer: Self-pay

## 2024-04-17 DIAGNOSIS — R188 Other ascites: Secondary | ICD-10-CM

## 2024-04-17 DIAGNOSIS — K7460 Unspecified cirrhosis of liver: Secondary | ICD-10-CM

## 2024-04-17 NOTE — Addendum Note (Signed)
 Addended by: ANITRA ODETTA CROME on: 04/17/2024 02:26 PM   Modules accepted: Orders

## 2024-04-18 ENCOUNTER — Ambulatory Visit: Payer: Self-pay | Admitting: Pharmacist

## 2024-04-18 DIAGNOSIS — I4821 Permanent atrial fibrillation: Secondary | ICD-10-CM

## 2024-04-18 DIAGNOSIS — I824Y9 Acute embolism and thrombosis of unspecified deep veins of unspecified proximal lower extremity: Secondary | ICD-10-CM

## 2024-04-18 DIAGNOSIS — Z7901 Long term (current) use of anticoagulants: Secondary | ICD-10-CM

## 2024-04-18 LAB — POCT INR: INR: 3.4 — AB (ref 2.0–3.0)

## 2024-04-18 NOTE — Progress Notes (Signed)
 Description   INR-3.4; Spoke Darell Kipper, RN, [facility] while she was at pt's home and instructed pt to continue taking Warfarin 1/2 TABLET DAILY EXCEPT 1 TABLET ON SUNDAYS AND THURSDAYS. Continue leafy vegetables in your diet & stay consistent.  Recheck INR in 1 week-CALL ONCE INR IS DONE.  Please call with any questions. Coumadin  Clinic [phone]

## 2024-04-18 NOTE — Patient Instructions (Signed)
 Description   INR-3.4; Spoke Darell Kipper, RN, [facility] while she was at pt's home and instructed pt to continue taking Warfarin 1/2 TABLET DAILY EXCEPT 1 TABLET ON SUNDAYS AND THURSDAYS. Continue leafy vegetables in your diet & stay consistent.  Recheck INR in 1 week-CALL ONCE INR IS DONE.  Please call with any questions. Coumadin  Clinic [phone]

## 2024-04-19 ENCOUNTER — Telehealth: Payer: Self-pay | Admitting: Cardiovascular Disease

## 2024-04-19 MED ORDER — TORSEMIDE 20 MG PO TABS: ORAL_TABLET | ORAL | 3 refills | Status: AC

## 2024-04-19 NOTE — Telephone Encounter (Signed)
 I also received this as a printed document and gave Izetta the orders. Thanks

## 2024-04-19 NOTE — Telephone Encounter (Signed)
 Jon from [facility] calling to request weight parameters. They currently have her between 116-131.   Start of Care 11/25: 141.8 Lowest: 137 Highest: 147 on 1/2

## 2024-04-19 NOTE — Telephone Encounter (Signed)
 S/w Chelsea Boyer- informed that these orders were faxed today- gave orders over the phone: Take Torsemide  40 mg daily if weight is 140 pounds or less Take Torsemide  60 mg daily on days that weight is over 140 pounds.  Asked to be notified if the pt's weight is 146 or more while using these parameters.   Spoke with Chelsea Boyer (dpr) and given the parameters above. Also sent RX to pharmacy- to fill later.  Chelsea Boyer reports that Chelsea Boyer should be getting a paracentesis next week- weather permitting and that should help a lot. (Were not able to go this week due to weather)

## 2024-04-19 NOTE — Addendum Note (Signed)
 Addended by: DAVEE IZETTA CROME on: 04/19/2024 05:28 PM   Modules accepted: Orders

## 2024-04-25 ENCOUNTER — Ambulatory Visit: Payer: Self-pay | Admitting: *Deleted

## 2024-04-25 DIAGNOSIS — I4821 Permanent atrial fibrillation: Secondary | ICD-10-CM

## 2024-04-25 DIAGNOSIS — I824Y9 Acute embolism and thrombosis of unspecified deep veins of unspecified proximal lower extremity: Secondary | ICD-10-CM

## 2024-04-25 DIAGNOSIS — Z7901 Long term (current) use of anticoagulants: Secondary | ICD-10-CM

## 2024-04-25 LAB — POCT INR: INR: 4.9 — AB (ref 2.0–3.0)

## 2024-04-25 NOTE — Patient Instructions (Signed)
 Description   INR-4.9; Spoke Carolynne Kipper, RN, [facility] while she was at pt's home and instructed pt not to take any warfarin today and no warfarin tomorrow then continue taking Warfarin 1/2 TABLET DAILY EXCEPT 1 TABLET ON SUNDAYS AND THURSDAYS. Continue leafy vegetables in your diet & stay consistent.  Recheck INR in 1 week-CALL ONCE INR IS DONE.  Please call with any questions. Coumadin  Clinic [phone]

## 2024-04-25 NOTE — Progress Notes (Signed)
 Description   INR-4.9; Spoke Carolynne Kipper, RN, [facility] while she was at pt's home and instructed pt not to take any warfarin today and no warfarin tomorrow then continue taking Warfarin 1/2 TABLET DAILY EXCEPT 1 TABLET ON SUNDAYS AND THURSDAYS. Continue leafy vegetables in your diet & stay consistent.  Recheck INR in 1 week-CALL ONCE INR IS DONE.  Please call with any questions. [facility] [phone]

## 2024-04-26 ENCOUNTER — Inpatient Hospital Stay (HOSPITAL_COMMUNITY): Admission: RE | Admit: 2024-04-26 | Discharge: 2024-04-26 | Attending: Gastroenterology | Admitting: Gastroenterology

## 2024-04-26 DIAGNOSIS — R188 Other ascites: Secondary | ICD-10-CM | POA: Insufficient documentation

## 2024-04-26 DIAGNOSIS — K7460 Unspecified cirrhosis of liver: Secondary | ICD-10-CM | POA: Insufficient documentation

## 2024-04-26 LAB — BODY FLUID CELL COUNT WITH DIFFERENTIAL
Eos, Fluid: 2 %
Lymphs, Fluid: 45 %
Monocyte-Macrophage-Serous Fluid: 51 % (ref 50–90)
Neutrophil Count, Fluid: 2 % (ref 0–25)
Total Nucleated Cell Count, Fluid: 141 uL (ref 0–1000)

## 2024-04-26 MED ORDER — LIDOCAINE-EPINEPHRINE 1 %-1:100000 IJ SOLN
INTRAMUSCULAR | Status: AC
  Filled 2024-04-26: qty 20

## 2024-04-26 MED ORDER — LIDOCAINE-EPINEPHRINE 1 %-1:100000 IJ SOLN
20.0000 mL | Freq: Once | INTRAMUSCULAR | Status: AC
  Administered 2024-04-26: 10 mL via INTRADERMAL

## 2024-04-26 NOTE — Procedures (Signed)
 PROCEDURE SUMMARY:  Successful image-guided paracentesis from the right lower abdomen.  Yielded 5 liters of blood-tinged fluid.  No immediate complications.  EBL: zero Patient tolerated well.   Specimen was sent for labs.  Please see imaging section of Epic for full dictation.  Greig FORBES Jasmine PA-C 04/26/2024 12:44 PM

## 2024-04-27 LAB — BODY FLUID CULTURE W GRAM STAIN: Culture: NO GROWTH
